# Patient Record
Sex: Female | Born: 1975 | Race: White | Hispanic: No | Smoking: Former smoker
Health system: Southern US, Community
[De-identification: ages and names within clinical notes are randomized; demographics above are authoritative.]

## PROBLEM LIST (undated history)

## (undated) DIAGNOSIS — M51369 Other intervertebral disc degeneration, lumbar region without mention of lumbar back pain or lower extremity pain: Secondary | ICD-10-CM

## (undated) DIAGNOSIS — G8929 Other chronic pain: Secondary | ICD-10-CM

## (undated) DIAGNOSIS — M1120 Other chondrocalcinosis, unspecified site: Secondary | ICD-10-CM

## (undated) DIAGNOSIS — T4145XA Adverse effect of unspecified anesthetic, initial encounter: Secondary | ICD-10-CM

## (undated) DIAGNOSIS — I10 Essential (primary) hypertension: Secondary | ICD-10-CM

## (undated) DIAGNOSIS — M5030 Other cervical disc degeneration, unspecified cervical region: Secondary | ICD-10-CM

## (undated) DIAGNOSIS — F410 Panic disorder [episodic paroxysmal anxiety] without agoraphobia: Secondary | ICD-10-CM

## (undated) DIAGNOSIS — F419 Anxiety disorder, unspecified: Secondary | ICD-10-CM

## (undated) DIAGNOSIS — N343 Urethral syndrome, unspecified: Secondary | ICD-10-CM

## (undated) DIAGNOSIS — M5136 Other intervertebral disc degeneration, lumbar region: Secondary | ICD-10-CM

## (undated) DIAGNOSIS — G4730 Sleep apnea, unspecified: Secondary | ICD-10-CM

## (undated) DIAGNOSIS — T8859XA Other complications of anesthesia, initial encounter: Secondary | ICD-10-CM

## (undated) DIAGNOSIS — J45990 Exercise induced bronchospasm: Secondary | ICD-10-CM

## (undated) DIAGNOSIS — S83419A Sprain of medial collateral ligament of unspecified knee, initial encounter: Secondary | ICD-10-CM

## (undated) DIAGNOSIS — Z8489 Family history of other specified conditions: Secondary | ICD-10-CM

## (undated) HISTORY — DX: Essential (primary) hypertension: I10

## (undated) HISTORY — DX: Other complications of anesthesia, initial encounter: T88.59XA

## (undated) HISTORY — DX: Other intervertebral disc degeneration, lumbar region: M51.36

## (undated) HISTORY — PX: APPENDECTOMY: SHX54

## (undated) HISTORY — DX: Adverse effect of unspecified anesthetic, initial encounter: T41.45XA

## (undated) HISTORY — DX: Panic disorder (episodic paroxysmal anxiety): F41.0

## (undated) HISTORY — DX: Other chondrocalcinosis, unspecified site: M11.20

## (undated) HISTORY — DX: Urethral syndrome, unspecified: N34.3

## (undated) HISTORY — DX: Sprain of medial collateral ligament of unspecified knee, initial encounter: S83.419A

## (undated) HISTORY — DX: Other cervical disc degeneration, unspecified cervical region: M50.30

## (undated) HISTORY — DX: Other chronic pain: G89.29

## (undated) HISTORY — PX: CRYOABLATION: SHX1415

## (undated) HISTORY — DX: Exercise induced bronchospasm: J45.990

## (undated) HISTORY — DX: Other intervertebral disc degeneration, lumbar region without mention of lumbar back pain or lower extremity pain: M51.369

---

## 1997-11-17 ENCOUNTER — Encounter: Admission: RE | Admit: 1997-11-17 | Discharge: 1997-11-17 | Payer: Self-pay | Admitting: Family Medicine

## 1997-12-13 ENCOUNTER — Encounter: Admission: RE | Admit: 1997-12-13 | Discharge: 1997-12-13 | Payer: Self-pay | Admitting: Family Medicine

## 1997-12-22 ENCOUNTER — Encounter: Admission: RE | Admit: 1997-12-22 | Discharge: 1997-12-22 | Payer: Self-pay | Admitting: Family Medicine

## 1997-12-27 ENCOUNTER — Encounter: Admission: RE | Admit: 1997-12-27 | Discharge: 1997-12-27 | Payer: Self-pay | Admitting: Sports Medicine

## 1998-01-12 ENCOUNTER — Encounter: Admission: RE | Admit: 1998-01-12 | Discharge: 1998-01-12 | Payer: Self-pay | Admitting: Family Medicine

## 1998-01-18 ENCOUNTER — Encounter: Admission: RE | Admit: 1998-01-18 | Discharge: 1998-01-18 | Payer: Self-pay | Admitting: Family Medicine

## 1998-01-23 ENCOUNTER — Encounter: Admission: RE | Admit: 1998-01-23 | Discharge: 1998-01-23 | Payer: Self-pay | Admitting: Family Medicine

## 1998-03-14 ENCOUNTER — Encounter: Admission: RE | Admit: 1998-03-14 | Discharge: 1998-03-14 | Payer: Self-pay | Admitting: Sports Medicine

## 1998-04-13 ENCOUNTER — Encounter: Admission: RE | Admit: 1998-04-13 | Discharge: 1998-04-13 | Payer: Self-pay | Admitting: Family Medicine

## 1998-06-20 ENCOUNTER — Encounter: Admission: RE | Admit: 1998-06-20 | Discharge: 1998-06-20 | Payer: Self-pay | Admitting: Family Medicine

## 1998-07-10 ENCOUNTER — Encounter: Admission: RE | Admit: 1998-07-10 | Discharge: 1998-07-10 | Payer: Self-pay | Admitting: Family Medicine

## 1998-07-20 ENCOUNTER — Encounter: Admission: RE | Admit: 1998-07-20 | Discharge: 1998-07-20 | Payer: Self-pay | Admitting: Family Medicine

## 1998-07-31 ENCOUNTER — Encounter: Admission: RE | Admit: 1998-07-31 | Discharge: 1998-07-31 | Payer: Self-pay | Admitting: Family Medicine

## 1998-07-31 ENCOUNTER — Emergency Department (HOSPITAL_COMMUNITY): Admission: EM | Admit: 1998-07-31 | Discharge: 1998-07-31 | Payer: Self-pay | Admitting: Emergency Medicine

## 1998-08-04 ENCOUNTER — Ambulatory Visit (HOSPITAL_COMMUNITY): Admission: RE | Admit: 1998-08-04 | Discharge: 1998-08-04 | Payer: Self-pay | Admitting: Internal Medicine

## 1998-08-06 ENCOUNTER — Encounter: Payer: Self-pay | Admitting: Internal Medicine

## 1998-08-06 ENCOUNTER — Ambulatory Visit (HOSPITAL_COMMUNITY): Admission: RE | Admit: 1998-08-06 | Discharge: 1998-08-06 | Payer: Self-pay | Admitting: Internal Medicine

## 1998-08-19 HISTORY — PX: WISDOM TOOTH EXTRACTION: SHX21

## 1998-09-14 ENCOUNTER — Encounter: Admission: RE | Admit: 1998-09-14 | Discharge: 1998-09-14 | Payer: Self-pay | Admitting: Family Medicine

## 1998-09-21 ENCOUNTER — Encounter: Admission: RE | Admit: 1998-09-21 | Discharge: 1998-09-21 | Payer: Self-pay | Admitting: Family Medicine

## 1998-10-05 ENCOUNTER — Encounter: Admission: RE | Admit: 1998-10-05 | Discharge: 1998-10-05 | Payer: Self-pay | Admitting: Family Medicine

## 1998-10-11 ENCOUNTER — Ambulatory Visit (HOSPITAL_COMMUNITY): Admission: RE | Admit: 1998-10-11 | Discharge: 1998-10-11 | Payer: Self-pay | Admitting: Family Medicine

## 1998-10-19 ENCOUNTER — Encounter: Admission: RE | Admit: 1998-10-19 | Discharge: 1998-10-19 | Payer: Self-pay | Admitting: Family Medicine

## 1998-10-27 ENCOUNTER — Encounter: Payer: Self-pay | Admitting: Oral & Maxillofacial Surgery

## 1998-10-31 ENCOUNTER — Ambulatory Visit (HOSPITAL_COMMUNITY): Admission: RE | Admit: 1998-10-31 | Discharge: 1998-10-31 | Payer: Self-pay | Admitting: Oral & Maxillofacial Surgery

## 1998-12-02 ENCOUNTER — Emergency Department (HOSPITAL_COMMUNITY): Admission: EM | Admit: 1998-12-02 | Discharge: 1998-12-02 | Payer: Self-pay | Admitting: Emergency Medicine

## 1998-12-22 ENCOUNTER — Emergency Department (HOSPITAL_COMMUNITY): Admission: EM | Admit: 1998-12-22 | Discharge: 1998-12-22 | Payer: Self-pay | Admitting: Emergency Medicine

## 1998-12-22 ENCOUNTER — Encounter: Admission: RE | Admit: 1998-12-22 | Discharge: 1998-12-22 | Payer: Self-pay | Admitting: Family Medicine

## 1999-01-24 ENCOUNTER — Encounter: Admission: RE | Admit: 1999-01-24 | Discharge: 1999-01-24 | Payer: Self-pay | Admitting: Family Medicine

## 1999-01-25 ENCOUNTER — Encounter: Admission: RE | Admit: 1999-01-25 | Discharge: 1999-01-25 | Payer: Self-pay | Admitting: Family Medicine

## 1999-02-08 ENCOUNTER — Encounter: Payer: Self-pay | Admitting: Emergency Medicine

## 1999-02-08 ENCOUNTER — Emergency Department (HOSPITAL_COMMUNITY): Admission: EM | Admit: 1999-02-08 | Discharge: 1999-02-08 | Payer: Self-pay | Admitting: Emergency Medicine

## 1999-02-13 ENCOUNTER — Encounter: Admission: RE | Admit: 1999-02-13 | Discharge: 1999-02-13 | Payer: Self-pay | Admitting: Sports Medicine

## 1999-02-16 ENCOUNTER — Encounter: Admission: RE | Admit: 1999-02-16 | Discharge: 1999-02-16 | Payer: Self-pay | Admitting: Family Medicine

## 1999-03-12 ENCOUNTER — Emergency Department (HOSPITAL_COMMUNITY): Admission: EM | Admit: 1999-03-12 | Discharge: 1999-03-12 | Payer: Self-pay | Admitting: Emergency Medicine

## 1999-04-12 ENCOUNTER — Encounter: Admission: RE | Admit: 1999-04-12 | Discharge: 1999-04-12 | Payer: Self-pay | Admitting: Family Medicine

## 1999-04-23 ENCOUNTER — Encounter: Payer: Self-pay | Admitting: Emergency Medicine

## 1999-04-23 ENCOUNTER — Emergency Department (HOSPITAL_COMMUNITY): Admission: EM | Admit: 1999-04-23 | Discharge: 1999-04-23 | Payer: Self-pay | Admitting: Emergency Medicine

## 1999-06-07 ENCOUNTER — Encounter: Admission: RE | Admit: 1999-06-07 | Discharge: 1999-06-07 | Payer: Self-pay | Admitting: Family Medicine

## 1999-06-15 ENCOUNTER — Encounter: Admission: RE | Admit: 1999-06-15 | Discharge: 1999-06-15 | Payer: Self-pay | Admitting: Sports Medicine

## 1999-07-26 ENCOUNTER — Encounter: Admission: RE | Admit: 1999-07-26 | Discharge: 1999-07-26 | Payer: Self-pay | Admitting: Family Medicine

## 1999-09-20 ENCOUNTER — Encounter: Admission: RE | Admit: 1999-09-20 | Discharge: 1999-09-20 | Payer: Self-pay | Admitting: Family Medicine

## 1999-10-13 ENCOUNTER — Emergency Department (HOSPITAL_COMMUNITY): Admission: EM | Admit: 1999-10-13 | Discharge: 1999-10-13 | Payer: Self-pay | Admitting: Emergency Medicine

## 1999-10-13 ENCOUNTER — Encounter: Payer: Self-pay | Admitting: Emergency Medicine

## 1999-10-28 ENCOUNTER — Emergency Department (HOSPITAL_COMMUNITY): Admission: EM | Admit: 1999-10-28 | Discharge: 1999-10-28 | Payer: Self-pay | Admitting: Emergency Medicine

## 1999-10-29 ENCOUNTER — Encounter: Payer: Self-pay | Admitting: Emergency Medicine

## 1999-11-19 ENCOUNTER — Encounter: Admission: RE | Admit: 1999-11-19 | Discharge: 1999-11-19 | Payer: Self-pay | Admitting: Sports Medicine

## 1999-11-19 ENCOUNTER — Encounter: Admission: RE | Admit: 1999-11-19 | Discharge: 1999-11-19 | Payer: Self-pay | Admitting: Family Medicine

## 1999-12-19 ENCOUNTER — Encounter: Admission: RE | Admit: 1999-12-19 | Discharge: 1999-12-19 | Payer: Self-pay | Admitting: Family Medicine

## 1999-12-22 ENCOUNTER — Emergency Department (HOSPITAL_COMMUNITY): Admission: EM | Admit: 1999-12-22 | Discharge: 1999-12-22 | Payer: Self-pay | Admitting: Emergency Medicine

## 2000-01-16 ENCOUNTER — Encounter: Admission: RE | Admit: 2000-01-16 | Discharge: 2000-01-16 | Payer: Self-pay | Admitting: Family Medicine

## 2000-02-06 ENCOUNTER — Encounter: Admission: RE | Admit: 2000-02-06 | Discharge: 2000-02-06 | Payer: Self-pay | Admitting: Family Medicine

## 2000-04-01 ENCOUNTER — Encounter: Admission: RE | Admit: 2000-04-01 | Discharge: 2000-04-01 | Payer: Self-pay | Admitting: Family Medicine

## 2000-06-22 ENCOUNTER — Encounter: Payer: Self-pay | Admitting: Emergency Medicine

## 2000-06-22 ENCOUNTER — Emergency Department (HOSPITAL_COMMUNITY): Admission: EM | Admit: 2000-06-22 | Discharge: 2000-06-22 | Payer: Self-pay | Admitting: Emergency Medicine

## 2000-06-27 ENCOUNTER — Encounter: Admission: RE | Admit: 2000-06-27 | Discharge: 2000-06-27 | Payer: Self-pay | Admitting: Family Medicine

## 2000-07-17 ENCOUNTER — Emergency Department (HOSPITAL_COMMUNITY): Admission: EM | Admit: 2000-07-17 | Discharge: 2000-07-18 | Payer: Self-pay | Admitting: Emergency Medicine

## 2000-07-18 ENCOUNTER — Encounter: Payer: Self-pay | Admitting: Emergency Medicine

## 2000-07-18 ENCOUNTER — Emergency Department (HOSPITAL_COMMUNITY): Admission: EM | Admit: 2000-07-18 | Discharge: 2000-07-18 | Payer: Self-pay | Admitting: Emergency Medicine

## 2000-08-26 ENCOUNTER — Encounter: Admission: RE | Admit: 2000-08-26 | Discharge: 2000-08-26 | Payer: Self-pay | Admitting: Family Medicine

## 2000-09-01 ENCOUNTER — Encounter: Admission: RE | Admit: 2000-09-01 | Discharge: 2000-09-01 | Payer: Self-pay | Admitting: Family Medicine

## 2000-09-02 ENCOUNTER — Ambulatory Visit (HOSPITAL_COMMUNITY): Admission: RE | Admit: 2000-09-02 | Discharge: 2000-09-02 | Payer: Self-pay | Admitting: *Deleted

## 2000-09-03 ENCOUNTER — Encounter: Admission: RE | Admit: 2000-09-03 | Discharge: 2000-09-03 | Payer: Self-pay | Admitting: Family Medicine

## 2000-09-05 ENCOUNTER — Encounter: Admission: RE | Admit: 2000-09-05 | Discharge: 2000-09-05 | Payer: Self-pay | Admitting: Sports Medicine

## 2000-09-05 ENCOUNTER — Encounter: Payer: Self-pay | Admitting: Sports Medicine

## 2000-09-05 ENCOUNTER — Encounter: Admission: RE | Admit: 2000-09-05 | Discharge: 2000-09-05 | Payer: Self-pay | Admitting: Family Medicine

## 2000-09-08 ENCOUNTER — Encounter: Admission: RE | Admit: 2000-09-08 | Discharge: 2000-09-08 | Payer: Self-pay | Admitting: Family Medicine

## 2000-09-23 ENCOUNTER — Encounter: Admission: RE | Admit: 2000-09-23 | Discharge: 2000-09-23 | Payer: Self-pay | Admitting: Sports Medicine

## 2001-01-06 ENCOUNTER — Encounter: Admission: RE | Admit: 2001-01-06 | Discharge: 2001-01-06 | Payer: Self-pay | Admitting: Family Medicine

## 2001-01-10 ENCOUNTER — Emergency Department (HOSPITAL_COMMUNITY): Admission: EM | Admit: 2001-01-10 | Discharge: 2001-01-11 | Payer: Self-pay

## 2001-04-05 ENCOUNTER — Emergency Department (HOSPITAL_COMMUNITY): Admission: EM | Admit: 2001-04-05 | Discharge: 2001-04-05 | Payer: Self-pay | Admitting: Emergency Medicine

## 2001-04-05 ENCOUNTER — Encounter: Payer: Self-pay | Admitting: Emergency Medicine

## 2001-04-07 ENCOUNTER — Encounter: Admission: RE | Admit: 2001-04-07 | Discharge: 2001-04-07 | Payer: Self-pay | Admitting: Family Medicine

## 2001-06-11 ENCOUNTER — Encounter: Payer: Self-pay | Admitting: Emergency Medicine

## 2001-06-11 ENCOUNTER — Emergency Department (HOSPITAL_COMMUNITY): Admission: EM | Admit: 2001-06-11 | Discharge: 2001-06-11 | Payer: Self-pay | Admitting: Emergency Medicine

## 2001-06-26 ENCOUNTER — Emergency Department (HOSPITAL_COMMUNITY): Admission: EM | Admit: 2001-06-26 | Discharge: 2001-06-26 | Payer: Self-pay | Admitting: Emergency Medicine

## 2001-07-12 ENCOUNTER — Emergency Department (HOSPITAL_COMMUNITY): Admission: EM | Admit: 2001-07-12 | Discharge: 2001-07-12 | Payer: Self-pay | Admitting: Emergency Medicine

## 2001-08-31 ENCOUNTER — Emergency Department (HOSPITAL_COMMUNITY): Admission: EM | Admit: 2001-08-31 | Discharge: 2001-08-31 | Payer: Self-pay | Admitting: Emergency Medicine

## 2001-08-31 ENCOUNTER — Encounter: Payer: Self-pay | Admitting: Emergency Medicine

## 2001-09-28 ENCOUNTER — Emergency Department (HOSPITAL_COMMUNITY): Admission: EM | Admit: 2001-09-28 | Discharge: 2001-09-28 | Payer: Self-pay | Admitting: Emergency Medicine

## 2001-09-29 ENCOUNTER — Encounter: Payer: Self-pay | Admitting: Emergency Medicine

## 2001-09-29 ENCOUNTER — Ambulatory Visit (HOSPITAL_COMMUNITY): Admission: RE | Admit: 2001-09-29 | Discharge: 2001-09-29 | Payer: Self-pay | Admitting: Emergency Medicine

## 2002-01-15 ENCOUNTER — Encounter: Admission: RE | Admit: 2002-01-15 | Discharge: 2002-01-15 | Payer: Self-pay | Admitting: Family Medicine

## 2002-01-19 ENCOUNTER — Encounter: Admission: RE | Admit: 2002-01-19 | Discharge: 2002-01-19 | Payer: Self-pay | Admitting: Family Medicine

## 2002-01-22 ENCOUNTER — Inpatient Hospital Stay (HOSPITAL_COMMUNITY): Admission: AD | Admit: 2002-01-22 | Discharge: 2002-01-22 | Payer: Self-pay | Admitting: *Deleted

## 2002-01-22 ENCOUNTER — Encounter: Admission: RE | Admit: 2002-01-22 | Discharge: 2002-01-22 | Payer: Self-pay | Admitting: Family Medicine

## 2002-01-29 ENCOUNTER — Encounter: Admission: RE | Admit: 2002-01-29 | Discharge: 2002-01-29 | Payer: Self-pay | Admitting: Family Medicine

## 2002-02-28 ENCOUNTER — Inpatient Hospital Stay (HOSPITAL_COMMUNITY): Admission: AD | Admit: 2002-02-28 | Discharge: 2002-02-28 | Payer: Self-pay | Admitting: *Deleted

## 2002-03-01 ENCOUNTER — Encounter: Admission: RE | Admit: 2002-03-01 | Discharge: 2002-03-01 | Payer: Self-pay | Admitting: Family Medicine

## 2002-03-03 ENCOUNTER — Encounter: Payer: Self-pay | Admitting: Emergency Medicine

## 2002-03-03 ENCOUNTER — Emergency Department (HOSPITAL_COMMUNITY): Admission: EM | Admit: 2002-03-03 | Discharge: 2002-03-03 | Payer: Self-pay | Admitting: Emergency Medicine

## 2002-04-01 ENCOUNTER — Encounter: Admission: RE | Admit: 2002-04-01 | Discharge: 2002-04-01 | Payer: Self-pay | Admitting: Family Medicine

## 2002-04-12 ENCOUNTER — Inpatient Hospital Stay (HOSPITAL_COMMUNITY): Admission: AD | Admit: 2002-04-12 | Discharge: 2002-04-12 | Payer: Self-pay | Admitting: *Deleted

## 2002-04-16 ENCOUNTER — Ambulatory Visit (HOSPITAL_COMMUNITY): Admission: RE | Admit: 2002-04-16 | Discharge: 2002-04-16 | Payer: Self-pay | Admitting: Family Medicine

## 2002-04-28 ENCOUNTER — Encounter: Admission: RE | Admit: 2002-04-28 | Discharge: 2002-04-28 | Payer: Self-pay | Admitting: Family Medicine

## 2002-05-07 ENCOUNTER — Encounter: Admission: RE | Admit: 2002-05-07 | Discharge: 2002-05-07 | Payer: Self-pay | Admitting: Family Medicine

## 2002-05-12 ENCOUNTER — Inpatient Hospital Stay (HOSPITAL_COMMUNITY): Admission: AD | Admit: 2002-05-12 | Discharge: 2002-05-12 | Payer: Self-pay | Admitting: Obstetrics and Gynecology

## 2002-06-07 ENCOUNTER — Encounter: Admission: RE | Admit: 2002-06-07 | Discharge: 2002-06-07 | Payer: Self-pay | Admitting: Family Medicine

## 2002-06-09 ENCOUNTER — Encounter: Admission: RE | Admit: 2002-06-09 | Discharge: 2002-06-09 | Payer: Self-pay | Admitting: Family Medicine

## 2002-06-18 ENCOUNTER — Encounter: Admission: RE | Admit: 2002-06-18 | Discharge: 2002-06-18 | Payer: Self-pay | Admitting: Family Medicine

## 2002-07-07 ENCOUNTER — Encounter: Admission: RE | Admit: 2002-07-07 | Discharge: 2002-07-07 | Payer: Self-pay | Admitting: Family Medicine

## 2002-07-19 ENCOUNTER — Encounter: Admission: RE | Admit: 2002-07-19 | Discharge: 2002-07-19 | Payer: Self-pay | Admitting: Family Medicine

## 2002-07-24 ENCOUNTER — Inpatient Hospital Stay (HOSPITAL_COMMUNITY): Admission: AD | Admit: 2002-07-24 | Discharge: 2002-07-24 | Payer: Self-pay | Admitting: Obstetrics and Gynecology

## 2002-08-06 ENCOUNTER — Encounter: Admission: RE | Admit: 2002-08-06 | Discharge: 2002-08-06 | Payer: Self-pay | Admitting: Family Medicine

## 2002-08-18 ENCOUNTER — Encounter: Payer: Self-pay | Admitting: Obstetrics and Gynecology

## 2002-08-18 ENCOUNTER — Inpatient Hospital Stay (HOSPITAL_COMMUNITY): Admission: AD | Admit: 2002-08-18 | Discharge: 2002-08-18 | Payer: Self-pay | Admitting: Obstetrics and Gynecology

## 2002-08-18 ENCOUNTER — Encounter: Admission: RE | Admit: 2002-08-18 | Discharge: 2002-08-18 | Payer: Self-pay | Admitting: Family Medicine

## 2002-08-23 ENCOUNTER — Encounter (HOSPITAL_COMMUNITY): Admission: RE | Admit: 2002-08-23 | Discharge: 2002-09-02 | Payer: Self-pay | Admitting: *Deleted

## 2002-08-26 ENCOUNTER — Encounter: Admission: RE | Admit: 2002-08-26 | Discharge: 2002-08-26 | Payer: Self-pay | Admitting: Sports Medicine

## 2002-08-28 ENCOUNTER — Inpatient Hospital Stay (HOSPITAL_COMMUNITY): Admission: AD | Admit: 2002-08-28 | Discharge: 2002-08-28 | Payer: Self-pay | Admitting: Obstetrics and Gynecology

## 2002-08-30 ENCOUNTER — Encounter: Admission: RE | Admit: 2002-08-30 | Discharge: 2002-08-30 | Payer: Self-pay | Admitting: Family Medicine

## 2002-09-02 ENCOUNTER — Inpatient Hospital Stay (HOSPITAL_COMMUNITY): Admission: AD | Admit: 2002-09-02 | Discharge: 2002-09-02 | Payer: Self-pay | Admitting: Obstetrics and Gynecology

## 2002-09-06 ENCOUNTER — Encounter (HOSPITAL_COMMUNITY): Admission: RE | Admit: 2002-09-06 | Discharge: 2002-09-21 | Payer: Self-pay | Admitting: Obstetrics and Gynecology

## 2002-09-07 ENCOUNTER — Encounter: Admission: RE | Admit: 2002-09-07 | Discharge: 2002-09-07 | Payer: Self-pay | Admitting: Sports Medicine

## 2002-09-09 ENCOUNTER — Inpatient Hospital Stay (HOSPITAL_COMMUNITY): Admission: AD | Admit: 2002-09-09 | Discharge: 2002-09-09 | Payer: Self-pay | Admitting: *Deleted

## 2002-09-09 ENCOUNTER — Encounter: Payer: Self-pay | Admitting: *Deleted

## 2002-09-14 ENCOUNTER — Inpatient Hospital Stay (HOSPITAL_COMMUNITY): Admission: AD | Admit: 2002-09-14 | Discharge: 2002-09-14 | Payer: Self-pay | Admitting: *Deleted

## 2002-09-17 ENCOUNTER — Encounter: Admission: RE | Admit: 2002-09-17 | Discharge: 2002-09-17 | Payer: Self-pay | Admitting: Family Medicine

## 2002-09-21 ENCOUNTER — Inpatient Hospital Stay (HOSPITAL_COMMUNITY): Admission: AD | Admit: 2002-09-21 | Discharge: 2002-09-25 | Payer: Self-pay | Admitting: Obstetrics and Gynecology

## 2002-09-22 ENCOUNTER — Encounter (INDEPENDENT_AMBULATORY_CARE_PROVIDER_SITE_OTHER): Payer: Self-pay

## 2002-09-24 ENCOUNTER — Encounter: Admission: RE | Admit: 2002-09-24 | Discharge: 2002-09-24 | Payer: Self-pay | Admitting: Family Medicine

## 2002-09-26 ENCOUNTER — Encounter: Admission: RE | Admit: 2002-09-26 | Discharge: 2002-10-26 | Payer: Self-pay | Admitting: *Deleted

## 2002-10-02 ENCOUNTER — Emergency Department (HOSPITAL_COMMUNITY): Admission: EM | Admit: 2002-10-02 | Discharge: 2002-10-02 | Payer: Self-pay

## 2002-10-05 ENCOUNTER — Encounter: Admission: RE | Admit: 2002-10-05 | Discharge: 2002-10-05 | Payer: Self-pay | Admitting: Family Medicine

## 2002-10-20 ENCOUNTER — Encounter: Admission: RE | Admit: 2002-10-20 | Discharge: 2002-10-20 | Payer: Self-pay | Admitting: Family Medicine

## 2002-10-26 ENCOUNTER — Encounter: Admission: RE | Admit: 2002-10-26 | Discharge: 2003-01-24 | Payer: Self-pay | Admitting: Sports Medicine

## 2002-11-25 ENCOUNTER — Encounter: Admission: RE | Admit: 2002-11-25 | Discharge: 2002-12-25 | Payer: Self-pay | Admitting: *Deleted

## 2002-11-29 ENCOUNTER — Encounter: Admission: RE | Admit: 2002-11-29 | Discharge: 2002-11-29 | Payer: Self-pay | Admitting: Family Medicine

## 2002-12-10 ENCOUNTER — Encounter: Admission: RE | Admit: 2002-12-10 | Discharge: 2002-12-10 | Payer: Self-pay | Admitting: Family Medicine

## 2002-12-24 ENCOUNTER — Encounter: Admission: RE | Admit: 2002-12-24 | Discharge: 2002-12-24 | Payer: Self-pay | Admitting: Family Medicine

## 2003-01-06 ENCOUNTER — Emergency Department (HOSPITAL_COMMUNITY): Admission: EM | Admit: 2003-01-06 | Discharge: 2003-01-06 | Payer: Self-pay | Admitting: Emergency Medicine

## 2003-01-07 ENCOUNTER — Encounter: Admission: RE | Admit: 2003-01-07 | Discharge: 2003-01-07 | Payer: Self-pay | Admitting: Family Medicine

## 2003-01-24 ENCOUNTER — Encounter: Admission: RE | Admit: 2003-01-24 | Discharge: 2003-01-24 | Payer: Self-pay | Admitting: Sports Medicine

## 2003-01-25 ENCOUNTER — Encounter: Admission: RE | Admit: 2003-01-25 | Discharge: 2003-02-24 | Payer: Self-pay | Admitting: *Deleted

## 2003-01-27 ENCOUNTER — Encounter: Admission: RE | Admit: 2003-01-27 | Discharge: 2003-01-27 | Payer: Self-pay | Admitting: Family Medicine

## 2003-03-27 ENCOUNTER — Encounter: Admission: RE | Admit: 2003-03-27 | Discharge: 2003-04-26 | Payer: Self-pay | Admitting: *Deleted

## 2003-04-27 ENCOUNTER — Encounter: Admission: RE | Admit: 2003-04-27 | Discharge: 2003-05-27 | Payer: Self-pay | Admitting: *Deleted

## 2003-06-27 ENCOUNTER — Encounter: Admission: RE | Admit: 2003-06-27 | Discharge: 2003-07-27 | Payer: Self-pay | Admitting: Obstetrics and Gynecology

## 2004-11-17 ENCOUNTER — Encounter (INDEPENDENT_AMBULATORY_CARE_PROVIDER_SITE_OTHER): Payer: Self-pay | Admitting: *Deleted

## 2004-11-17 LAB — CONVERTED CEMR LAB

## 2004-11-22 ENCOUNTER — Inpatient Hospital Stay (HOSPITAL_COMMUNITY): Admission: AD | Admit: 2004-11-22 | Discharge: 2004-11-22 | Payer: Self-pay | Admitting: Obstetrics & Gynecology

## 2004-11-29 ENCOUNTER — Inpatient Hospital Stay (HOSPITAL_COMMUNITY): Admission: RE | Admit: 2004-11-29 | Discharge: 2004-11-29 | Payer: Self-pay | Admitting: Obstetrics & Gynecology

## 2004-11-30 ENCOUNTER — Ambulatory Visit: Payer: Self-pay | Admitting: *Deleted

## 2004-11-30 ENCOUNTER — Inpatient Hospital Stay (HOSPITAL_COMMUNITY): Admission: AD | Admit: 2004-11-30 | Discharge: 2004-12-02 | Payer: Self-pay | Admitting: *Deleted

## 2004-12-11 ENCOUNTER — Ambulatory Visit: Payer: Self-pay | Admitting: Sports Medicine

## 2004-12-11 ENCOUNTER — Other Ambulatory Visit: Admission: RE | Admit: 2004-12-11 | Discharge: 2004-12-11 | Payer: Self-pay | Admitting: Family Medicine

## 2005-02-11 ENCOUNTER — Ambulatory Visit: Payer: Self-pay | Admitting: Sports Medicine

## 2005-02-13 ENCOUNTER — Ambulatory Visit (HOSPITAL_COMMUNITY): Admission: RE | Admit: 2005-02-13 | Discharge: 2005-02-13 | Payer: Self-pay | Admitting: Family Medicine

## 2005-03-12 ENCOUNTER — Ambulatory Visit: Payer: Self-pay | Admitting: Sports Medicine

## 2005-04-12 ENCOUNTER — Ambulatory Visit: Payer: Self-pay | Admitting: Family Medicine

## 2005-05-08 ENCOUNTER — Ambulatory Visit: Payer: Self-pay | Admitting: Family Medicine

## 2005-05-10 ENCOUNTER — Ambulatory Visit: Payer: Self-pay | Admitting: Family Medicine

## 2005-05-24 ENCOUNTER — Ambulatory Visit: Payer: Self-pay | Admitting: Family Medicine

## 2005-05-24 ENCOUNTER — Inpatient Hospital Stay (HOSPITAL_COMMUNITY): Admission: AD | Admit: 2005-05-24 | Discharge: 2005-05-25 | Payer: Self-pay | Admitting: *Deleted

## 2005-05-28 ENCOUNTER — Ambulatory Visit: Payer: Self-pay | Admitting: Family Medicine

## 2005-06-19 ENCOUNTER — Ambulatory Visit: Payer: Self-pay | Admitting: Family Medicine

## 2005-06-24 ENCOUNTER — Inpatient Hospital Stay (HOSPITAL_COMMUNITY): Admission: AD | Admit: 2005-06-24 | Discharge: 2005-06-27 | Payer: Self-pay | Admitting: *Deleted

## 2005-06-24 ENCOUNTER — Encounter (INDEPENDENT_AMBULATORY_CARE_PROVIDER_SITE_OTHER): Payer: Self-pay | Admitting: Specialist

## 2005-06-24 ENCOUNTER — Ambulatory Visit: Payer: Self-pay | Admitting: *Deleted

## 2005-06-28 HISTORY — PX: TUBAL LIGATION: SHX77

## 2005-06-29 ENCOUNTER — Inpatient Hospital Stay (HOSPITAL_COMMUNITY): Admission: AD | Admit: 2005-06-29 | Discharge: 2005-06-29 | Payer: Self-pay | Admitting: Family Medicine

## 2005-06-29 ENCOUNTER — Ambulatory Visit: Payer: Self-pay | Admitting: *Deleted

## 2005-06-30 ENCOUNTER — Inpatient Hospital Stay (HOSPITAL_COMMUNITY): Admission: AD | Admit: 2005-06-30 | Discharge: 2005-06-30 | Payer: Self-pay | Admitting: Obstetrics and Gynecology

## 2005-07-16 ENCOUNTER — Ambulatory Visit: Payer: Self-pay | Admitting: Sports Medicine

## 2005-07-24 ENCOUNTER — Ambulatory Visit: Payer: Self-pay | Admitting: Family Medicine

## 2005-07-28 ENCOUNTER — Emergency Department (HOSPITAL_COMMUNITY): Admission: EM | Admit: 2005-07-28 | Discharge: 2005-07-28 | Payer: Self-pay | Admitting: Emergency Medicine

## 2005-08-13 ENCOUNTER — Emergency Department (HOSPITAL_COMMUNITY): Admission: EM | Admit: 2005-08-13 | Discharge: 2005-08-13 | Payer: Self-pay | Admitting: Emergency Medicine

## 2005-08-21 ENCOUNTER — Ambulatory Visit: Payer: Self-pay | Admitting: Family Medicine

## 2005-09-24 ENCOUNTER — Ambulatory Visit (HOSPITAL_COMMUNITY): Admission: RE | Admit: 2005-09-24 | Discharge: 2005-09-24 | Payer: Self-pay | Admitting: Family Medicine

## 2005-09-24 ENCOUNTER — Emergency Department (HOSPITAL_COMMUNITY): Admission: EM | Admit: 2005-09-24 | Discharge: 2005-09-24 | Payer: Self-pay | Admitting: Family Medicine

## 2005-10-04 ENCOUNTER — Ambulatory Visit: Payer: Self-pay | Admitting: Sports Medicine

## 2005-10-14 ENCOUNTER — Ambulatory Visit: Payer: Self-pay | Admitting: Family Medicine

## 2005-11-28 ENCOUNTER — Ambulatory Visit: Payer: Self-pay | Admitting: Family Medicine

## 2006-02-07 ENCOUNTER — Ambulatory Visit: Payer: Self-pay | Admitting: Family Medicine

## 2006-04-14 ENCOUNTER — Emergency Department (HOSPITAL_COMMUNITY): Admission: EM | Admit: 2006-04-14 | Discharge: 2006-04-14 | Payer: Self-pay | Admitting: Emergency Medicine

## 2006-04-26 ENCOUNTER — Emergency Department (HOSPITAL_COMMUNITY): Admission: EM | Admit: 2006-04-26 | Discharge: 2006-04-26 | Payer: Self-pay | Admitting: Emergency Medicine

## 2006-06-06 ENCOUNTER — Emergency Department (HOSPITAL_COMMUNITY): Admission: EM | Admit: 2006-06-06 | Discharge: 2006-06-06 | Payer: Self-pay | Admitting: Family Medicine

## 2006-10-16 DIAGNOSIS — J309 Allergic rhinitis, unspecified: Secondary | ICD-10-CM

## 2006-10-17 ENCOUNTER — Encounter (INDEPENDENT_AMBULATORY_CARE_PROVIDER_SITE_OTHER): Payer: Self-pay | Admitting: *Deleted

## 2006-10-28 ENCOUNTER — Emergency Department (HOSPITAL_COMMUNITY): Admission: EM | Admit: 2006-10-28 | Discharge: 2006-10-28 | Payer: Self-pay | Admitting: Emergency Medicine

## 2006-12-13 ENCOUNTER — Emergency Department (HOSPITAL_COMMUNITY): Admission: EM | Admit: 2006-12-13 | Discharge: 2006-12-13 | Payer: Self-pay | Admitting: Emergency Medicine

## 2006-12-15 ENCOUNTER — Ambulatory Visit: Payer: Self-pay | Admitting: Family Medicine

## 2006-12-15 ENCOUNTER — Telehealth: Payer: Self-pay | Admitting: *Deleted

## 2006-12-15 ENCOUNTER — Encounter: Payer: Self-pay | Admitting: Family Medicine

## 2007-04-29 ENCOUNTER — Ambulatory Visit: Payer: Self-pay

## 2007-04-29 ENCOUNTER — Telehealth (INDEPENDENT_AMBULATORY_CARE_PROVIDER_SITE_OTHER): Payer: Self-pay | Admitting: *Deleted

## 2007-04-29 LAB — CONVERTED CEMR LAB
Bilirubin Urine: NEGATIVE
Glucose, Urine, Semiquant: NEGATIVE
Protein, U semiquant: 100
pH: 7

## 2007-05-12 ENCOUNTER — Emergency Department (HOSPITAL_COMMUNITY): Admission: EM | Admit: 2007-05-12 | Discharge: 2007-05-13 | Payer: Self-pay | Admitting: Emergency Medicine

## 2007-05-23 ENCOUNTER — Emergency Department (HOSPITAL_COMMUNITY): Admission: EM | Admit: 2007-05-23 | Discharge: 2007-05-23 | Payer: Self-pay | Admitting: Emergency Medicine

## 2007-05-25 ENCOUNTER — Ambulatory Visit: Payer: Self-pay | Admitting: Family Medicine

## 2007-05-25 ENCOUNTER — Telehealth (INDEPENDENT_AMBULATORY_CARE_PROVIDER_SITE_OTHER): Payer: Self-pay | Admitting: *Deleted

## 2007-06-04 ENCOUNTER — Telehealth: Payer: Self-pay | Admitting: Psychology

## 2007-06-08 ENCOUNTER — Ambulatory Visit: Payer: Self-pay | Admitting: Family Medicine

## 2007-06-08 ENCOUNTER — Telehealth: Payer: Self-pay | Admitting: *Deleted

## 2007-06-08 DIAGNOSIS — F410 Panic disorder [episodic paroxysmal anxiety] without agoraphobia: Secondary | ICD-10-CM | POA: Insufficient documentation

## 2007-06-09 ENCOUNTER — Ambulatory Visit: Payer: Self-pay | Admitting: Family Medicine

## 2007-06-10 ENCOUNTER — Telehealth: Payer: Self-pay | Admitting: Psychology

## 2007-06-16 ENCOUNTER — Ambulatory Visit: Payer: Self-pay | Admitting: Psychology

## 2007-06-24 ENCOUNTER — Ambulatory Visit: Payer: Self-pay | Admitting: Family Medicine

## 2007-06-25 ENCOUNTER — Ambulatory Visit: Payer: Self-pay | Admitting: Family Medicine

## 2007-07-02 ENCOUNTER — Ambulatory Visit: Payer: Self-pay | Admitting: Family Medicine

## 2007-07-02 DIAGNOSIS — F319 Bipolar disorder, unspecified: Secondary | ICD-10-CM

## 2007-07-06 ENCOUNTER — Telehealth (INDEPENDENT_AMBULATORY_CARE_PROVIDER_SITE_OTHER): Payer: Self-pay | Admitting: Family Medicine

## 2007-07-09 ENCOUNTER — Ambulatory Visit: Payer: Self-pay | Admitting: Psychology

## 2007-07-21 ENCOUNTER — Telehealth (INDEPENDENT_AMBULATORY_CARE_PROVIDER_SITE_OTHER): Payer: Self-pay | Admitting: Family Medicine

## 2007-07-24 ENCOUNTER — Ambulatory Visit: Payer: Self-pay | Admitting: Family Medicine

## 2007-07-27 ENCOUNTER — Telehealth: Payer: Self-pay | Admitting: *Deleted

## 2007-07-27 ENCOUNTER — Ambulatory Visit: Payer: Self-pay | Admitting: Family Medicine

## 2007-07-30 ENCOUNTER — Ambulatory Visit: Payer: Self-pay | Admitting: Psychology

## 2007-08-04 ENCOUNTER — Telehealth: Payer: Self-pay | Admitting: *Deleted

## 2007-08-04 ENCOUNTER — Ambulatory Visit: Payer: Self-pay | Admitting: Family Medicine

## 2007-08-05 ENCOUNTER — Ambulatory Visit: Payer: Self-pay | Admitting: Psychology

## 2007-08-06 ENCOUNTER — Ambulatory Visit: Payer: Self-pay | Admitting: Psychology

## 2007-08-16 ENCOUNTER — Emergency Department (HOSPITAL_COMMUNITY): Admission: EM | Admit: 2007-08-16 | Discharge: 2007-08-17 | Payer: Self-pay | Admitting: Emergency Medicine

## 2007-08-18 ENCOUNTER — Ambulatory Visit: Payer: Self-pay | Admitting: Sports Medicine

## 2007-08-18 ENCOUNTER — Telehealth: Payer: Self-pay | Admitting: Psychology

## 2007-08-20 HISTORY — PX: ENDOMETRIAL ABLATION: SHX621

## 2007-08-25 ENCOUNTER — Ambulatory Visit: Payer: Self-pay | Admitting: Psychology

## 2007-08-31 ENCOUNTER — Emergency Department (HOSPITAL_COMMUNITY): Admission: EM | Admit: 2007-08-31 | Discharge: 2007-08-31 | Payer: Self-pay | Admitting: Emergency Medicine

## 2007-09-03 ENCOUNTER — Encounter: Payer: Self-pay | Admitting: Psychology

## 2007-09-09 ENCOUNTER — Ambulatory Visit: Payer: Self-pay | Admitting: Psychology

## 2007-09-10 ENCOUNTER — Ambulatory Visit: Payer: Self-pay | Admitting: Psychology

## 2007-09-17 ENCOUNTER — Telehealth (INDEPENDENT_AMBULATORY_CARE_PROVIDER_SITE_OTHER): Payer: Self-pay | Admitting: Family Medicine

## 2007-09-18 ENCOUNTER — Emergency Department (HOSPITAL_COMMUNITY): Admission: EM | Admit: 2007-09-18 | Discharge: 2007-09-18 | Payer: Self-pay | Admitting: Emergency Medicine

## 2007-09-27 ENCOUNTER — Telehealth (INDEPENDENT_AMBULATORY_CARE_PROVIDER_SITE_OTHER): Payer: Self-pay | Admitting: Family Medicine

## 2007-09-28 ENCOUNTER — Emergency Department (HOSPITAL_COMMUNITY): Admission: EM | Admit: 2007-09-28 | Discharge: 2007-09-28 | Payer: Self-pay | Admitting: Emergency Medicine

## 2007-09-28 ENCOUNTER — Telehealth: Payer: Self-pay | Admitting: Psychology

## 2007-09-28 ENCOUNTER — Telehealth (INDEPENDENT_AMBULATORY_CARE_PROVIDER_SITE_OTHER): Payer: Self-pay | Admitting: Family Medicine

## 2007-10-07 ENCOUNTER — Ambulatory Visit: Payer: Self-pay | Admitting: Psychology

## 2007-10-07 ENCOUNTER — Encounter (INDEPENDENT_AMBULATORY_CARE_PROVIDER_SITE_OTHER): Payer: Self-pay | Admitting: Family Medicine

## 2007-10-07 ENCOUNTER — Emergency Department: Payer: Self-pay | Admitting: Emergency Medicine

## 2007-10-07 LAB — CONVERTED CEMR LAB
AST: 37 units/L (ref 0–37)
Alkaline Phosphatase: 62 units/L (ref 39–117)
BUN: 11 mg/dL (ref 6–23)
Creatinine, Ser: 0.72 mg/dL (ref 0.40–1.20)
HCT: 40.4 % (ref 36.0–46.0)
Hemoglobin: 13.4 g/dL (ref 12.0–15.0)
MCHC: 33.2 g/dL (ref 30.0–36.0)
MCV: 93.5 fL (ref 78.0–100.0)
RDW: 12.4 % (ref 11.5–15.5)

## 2007-10-27 ENCOUNTER — Telehealth (INDEPENDENT_AMBULATORY_CARE_PROVIDER_SITE_OTHER): Payer: Self-pay | Admitting: *Deleted

## 2007-10-27 ENCOUNTER — Ambulatory Visit: Payer: Self-pay | Admitting: Family Medicine

## 2007-10-28 ENCOUNTER — Encounter (INDEPENDENT_AMBULATORY_CARE_PROVIDER_SITE_OTHER): Payer: Self-pay | Admitting: *Deleted

## 2007-10-30 ENCOUNTER — Ambulatory Visit: Payer: Self-pay | Admitting: Family Medicine

## 2007-10-30 ENCOUNTER — Encounter (INDEPENDENT_AMBULATORY_CARE_PROVIDER_SITE_OTHER): Payer: Self-pay | Admitting: Family Medicine

## 2007-10-30 LAB — CONVERTED CEMR LAB
Chlamydia, DNA Probe: NEGATIVE
MCHC: 33.7 g/dL (ref 30.0–36.0)
Platelets: 191 10*3/uL (ref 150–400)
RBC: 4.19 M/uL (ref 3.87–5.11)
WBC: 5.9 10*3/uL (ref 4.0–10.5)
Whiff Test: POSITIVE

## 2007-11-02 ENCOUNTER — Encounter (INDEPENDENT_AMBULATORY_CARE_PROVIDER_SITE_OTHER): Payer: Self-pay | Admitting: Family Medicine

## 2007-11-02 ENCOUNTER — Ambulatory Visit: Payer: Self-pay | Admitting: Family Medicine

## 2007-11-02 ENCOUNTER — Encounter: Payer: Self-pay | Admitting: Family Medicine

## 2007-11-02 ENCOUNTER — Telehealth: Payer: Self-pay | Admitting: *Deleted

## 2007-11-02 LAB — CONVERTED CEMR LAB
Pap Smear: NORMAL
TSH: 1.696 microintl units/mL (ref 0.350–5.50)

## 2007-11-03 ENCOUNTER — Encounter: Payer: Self-pay | Admitting: Family Medicine

## 2007-11-03 ENCOUNTER — Telehealth (INDEPENDENT_AMBULATORY_CARE_PROVIDER_SITE_OTHER): Payer: Self-pay | Admitting: Family Medicine

## 2007-11-05 ENCOUNTER — Ambulatory Visit: Payer: Self-pay | Admitting: Family Medicine

## 2007-11-08 ENCOUNTER — Emergency Department (HOSPITAL_COMMUNITY): Admission: EM | Admit: 2007-11-08 | Discharge: 2007-11-08 | Payer: Self-pay | Admitting: Pediatrics

## 2007-11-09 ENCOUNTER — Emergency Department (HOSPITAL_COMMUNITY): Admission: EM | Admit: 2007-11-09 | Discharge: 2007-11-09 | Payer: Self-pay | Admitting: Emergency Medicine

## 2007-11-09 ENCOUNTER — Telehealth: Payer: Self-pay | Admitting: *Deleted

## 2007-11-09 ENCOUNTER — Encounter (INDEPENDENT_AMBULATORY_CARE_PROVIDER_SITE_OTHER): Payer: Self-pay | Admitting: Family Medicine

## 2007-11-16 ENCOUNTER — Encounter: Payer: Self-pay | Admitting: *Deleted

## 2007-11-16 ENCOUNTER — Ambulatory Visit: Payer: Self-pay | Admitting: Family Medicine

## 2007-11-17 ENCOUNTER — Telehealth: Payer: Self-pay | Admitting: *Deleted

## 2007-11-18 ENCOUNTER — Encounter: Payer: Self-pay | Admitting: Psychology

## 2007-11-24 ENCOUNTER — Ambulatory Visit: Payer: Self-pay | Admitting: Psychology

## 2007-11-30 ENCOUNTER — Ambulatory Visit: Payer: Self-pay | Admitting: Sports Medicine

## 2007-12-03 ENCOUNTER — Ambulatory Visit: Payer: Self-pay | Admitting: Family Medicine

## 2007-12-03 ENCOUNTER — Telehealth: Payer: Self-pay | Admitting: *Deleted

## 2007-12-03 LAB — CONVERTED CEMR LAB: Rapid Strep: NEGATIVE

## 2007-12-10 ENCOUNTER — Telehealth: Payer: Self-pay | Admitting: Psychology

## 2007-12-11 ENCOUNTER — Ambulatory Visit: Payer: Self-pay | Admitting: Family Medicine

## 2007-12-11 ENCOUNTER — Encounter: Payer: Self-pay | Admitting: Psychology

## 2007-12-14 ENCOUNTER — Telehealth: Payer: Self-pay | Admitting: Psychology

## 2007-12-16 ENCOUNTER — Telehealth: Payer: Self-pay | Admitting: *Deleted

## 2007-12-17 ENCOUNTER — Encounter: Payer: Self-pay | Admitting: *Deleted

## 2007-12-22 ENCOUNTER — Ambulatory Visit: Payer: Self-pay | Admitting: Psychology

## 2007-12-24 ENCOUNTER — Encounter: Payer: Self-pay | Admitting: Psychology

## 2007-12-29 ENCOUNTER — Ambulatory Visit: Payer: Self-pay | Admitting: Family Medicine

## 2007-12-30 ENCOUNTER — Ambulatory Visit: Payer: Self-pay | Admitting: Psychology

## 2008-01-07 ENCOUNTER — Ambulatory Visit: Payer: Self-pay | Admitting: Sports Medicine

## 2008-01-07 ENCOUNTER — Encounter (INDEPENDENT_AMBULATORY_CARE_PROVIDER_SITE_OTHER): Payer: Self-pay | Admitting: Family Medicine

## 2008-01-07 LAB — CONVERTED CEMR LAB
MCV: 94.2 fL (ref 78.0–100.0)
Platelets: 161 10*3/uL (ref 150–400)
RDW: 11.9 % (ref 11.5–15.5)
WBC: 7.4 10*3/uL (ref 4.0–10.5)

## 2008-01-08 ENCOUNTER — Encounter: Admission: RE | Admit: 2008-01-08 | Discharge: 2008-01-08 | Payer: Self-pay | Admitting: Family Medicine

## 2008-01-10 ENCOUNTER — Emergency Department (HOSPITAL_COMMUNITY): Admission: EM | Admit: 2008-01-10 | Discharge: 2008-01-10 | Payer: Self-pay | Admitting: Family Medicine

## 2008-01-12 ENCOUNTER — Ambulatory Visit: Payer: Self-pay | Admitting: Family Medicine

## 2008-01-12 ENCOUNTER — Encounter (INDEPENDENT_AMBULATORY_CARE_PROVIDER_SITE_OTHER): Payer: Self-pay | Admitting: Family Medicine

## 2008-01-13 ENCOUNTER — Encounter (INDEPENDENT_AMBULATORY_CARE_PROVIDER_SITE_OTHER): Payer: Self-pay | Admitting: Family Medicine

## 2008-01-15 ENCOUNTER — Telehealth: Payer: Self-pay | Admitting: *Deleted

## 2008-01-18 ENCOUNTER — Telehealth (INDEPENDENT_AMBULATORY_CARE_PROVIDER_SITE_OTHER): Payer: Self-pay | Admitting: *Deleted

## 2008-01-19 ENCOUNTER — Encounter (INDEPENDENT_AMBULATORY_CARE_PROVIDER_SITE_OTHER): Payer: Self-pay | Admitting: Family Medicine

## 2008-01-19 ENCOUNTER — Ambulatory Visit: Payer: Self-pay | Admitting: Family Medicine

## 2008-01-20 ENCOUNTER — Ambulatory Visit: Payer: Self-pay | Admitting: Psychology

## 2008-01-22 ENCOUNTER — Encounter: Payer: Self-pay | Admitting: Family Medicine

## 2008-01-23 ENCOUNTER — Emergency Department (HOSPITAL_COMMUNITY): Admission: EM | Admit: 2008-01-23 | Discharge: 2008-01-23 | Payer: Self-pay | Admitting: Emergency Medicine

## 2008-01-25 ENCOUNTER — Telehealth (INDEPENDENT_AMBULATORY_CARE_PROVIDER_SITE_OTHER): Payer: Self-pay | Admitting: Family Medicine

## 2008-01-25 ENCOUNTER — Encounter: Payer: Self-pay | Admitting: *Deleted

## 2008-01-26 ENCOUNTER — Encounter: Payer: Self-pay | Admitting: *Deleted

## 2008-01-28 ENCOUNTER — Emergency Department (HOSPITAL_COMMUNITY): Admission: EM | Admit: 2008-01-28 | Discharge: 2008-01-29 | Payer: Self-pay | Admitting: Emergency Medicine

## 2008-01-28 ENCOUNTER — Ambulatory Visit: Payer: Self-pay | Admitting: Psychology

## 2008-01-29 ENCOUNTER — Encounter: Payer: Self-pay | Admitting: Psychology

## 2008-02-03 ENCOUNTER — Ambulatory Visit: Payer: Self-pay | Admitting: Family Medicine

## 2008-02-03 LAB — CONVERTED CEMR LAB: Rapid Strep: NEGATIVE

## 2008-02-04 ENCOUNTER — Telehealth (INDEPENDENT_AMBULATORY_CARE_PROVIDER_SITE_OTHER): Payer: Self-pay | Admitting: Family Medicine

## 2008-02-09 ENCOUNTER — Emergency Department: Payer: Self-pay | Admitting: Emergency Medicine

## 2008-02-10 ENCOUNTER — Encounter: Payer: Self-pay | Admitting: Psychology

## 2008-02-11 ENCOUNTER — Ambulatory Visit: Payer: Self-pay | Admitting: Family Medicine

## 2008-02-11 ENCOUNTER — Ambulatory Visit: Payer: Self-pay | Admitting: Psychology

## 2008-02-11 ENCOUNTER — Telehealth: Payer: Self-pay | Admitting: *Deleted

## 2008-02-11 LAB — CONVERTED CEMR LAB
Ketones, urine, test strip: NEGATIVE
Nitrite: NEGATIVE
Specific Gravity, Urine: 1.015
WBC Urine, dipstick: NEGATIVE
pH: 8.5

## 2008-02-13 ENCOUNTER — Emergency Department (HOSPITAL_COMMUNITY): Admission: EM | Admit: 2008-02-13 | Discharge: 2008-02-14 | Payer: Self-pay | Admitting: Emergency Medicine

## 2008-02-15 ENCOUNTER — Encounter: Payer: Self-pay | Admitting: Psychology

## 2008-02-16 ENCOUNTER — Telehealth (INDEPENDENT_AMBULATORY_CARE_PROVIDER_SITE_OTHER): Payer: Self-pay | Admitting: *Deleted

## 2008-02-29 ENCOUNTER — Emergency Department (HOSPITAL_COMMUNITY): Admission: EM | Admit: 2008-02-29 | Discharge: 2008-03-01 | Payer: Self-pay | Admitting: Emergency Medicine

## 2008-03-03 ENCOUNTER — Ambulatory Visit: Payer: Self-pay | Admitting: Obstetrics and Gynecology

## 2008-03-04 ENCOUNTER — Emergency Department (HOSPITAL_COMMUNITY): Admission: EM | Admit: 2008-03-04 | Discharge: 2008-03-05 | Payer: Self-pay | Admitting: Emergency Medicine

## 2008-03-10 ENCOUNTER — Encounter: Payer: Self-pay | Admitting: Psychology

## 2008-03-30 ENCOUNTER — Inpatient Hospital Stay (HOSPITAL_COMMUNITY): Admission: AD | Admit: 2008-03-30 | Discharge: 2008-03-30 | Payer: Self-pay | Admitting: Obstetrics & Gynecology

## 2008-03-30 ENCOUNTER — Telehealth (INDEPENDENT_AMBULATORY_CARE_PROVIDER_SITE_OTHER): Payer: Self-pay | Admitting: *Deleted

## 2008-04-04 ENCOUNTER — Inpatient Hospital Stay (HOSPITAL_COMMUNITY): Admission: AD | Admit: 2008-04-04 | Discharge: 2008-04-04 | Payer: Self-pay | Admitting: Obstetrics & Gynecology

## 2008-04-04 ENCOUNTER — Ambulatory Visit: Payer: Self-pay | Admitting: Obstetrics & Gynecology

## 2008-04-04 ENCOUNTER — Ambulatory Visit (HOSPITAL_COMMUNITY): Admission: RE | Admit: 2008-04-04 | Discharge: 2008-04-04 | Payer: Self-pay | Admitting: Obstetrics & Gynecology

## 2008-04-26 ENCOUNTER — Emergency Department (HOSPITAL_COMMUNITY): Admission: EM | Admit: 2008-04-26 | Discharge: 2008-04-27 | Payer: Self-pay | Admitting: Emergency Medicine

## 2008-04-26 ENCOUNTER — Telehealth: Payer: Self-pay | Admitting: Family Medicine

## 2008-05-02 ENCOUNTER — Emergency Department (HOSPITAL_COMMUNITY): Admission: EM | Admit: 2008-05-02 | Discharge: 2008-05-02 | Payer: Self-pay | Admitting: *Deleted

## 2008-05-06 ENCOUNTER — Telehealth (INDEPENDENT_AMBULATORY_CARE_PROVIDER_SITE_OTHER): Payer: Self-pay | Admitting: *Deleted

## 2008-05-10 ENCOUNTER — Emergency Department (HOSPITAL_COMMUNITY): Admission: EM | Admit: 2008-05-10 | Discharge: 2008-05-10 | Payer: Self-pay | Admitting: *Deleted

## 2008-05-18 ENCOUNTER — Emergency Department (HOSPITAL_COMMUNITY): Admission: EM | Admit: 2008-05-18 | Discharge: 2008-05-19 | Payer: Self-pay | Admitting: Emergency Medicine

## 2008-05-23 ENCOUNTER — Telehealth: Payer: Self-pay | Admitting: Psychology

## 2008-06-27 ENCOUNTER — Emergency Department (HOSPITAL_COMMUNITY): Admission: EM | Admit: 2008-06-27 | Discharge: 2008-06-27 | Payer: Self-pay | Admitting: Emergency Medicine

## 2008-06-29 ENCOUNTER — Telehealth: Payer: Self-pay | Admitting: Family Medicine

## 2008-06-29 ENCOUNTER — Emergency Department (HOSPITAL_COMMUNITY): Admission: EM | Admit: 2008-06-29 | Discharge: 2008-06-29 | Payer: Self-pay | Admitting: Emergency Medicine

## 2008-07-04 ENCOUNTER — Telehealth (INDEPENDENT_AMBULATORY_CARE_PROVIDER_SITE_OTHER): Payer: Self-pay | Admitting: Family Medicine

## 2008-07-18 ENCOUNTER — Ambulatory Visit: Payer: Self-pay | Admitting: Family Medicine

## 2008-07-18 ENCOUNTER — Telehealth (INDEPENDENT_AMBULATORY_CARE_PROVIDER_SITE_OTHER): Payer: Self-pay | Admitting: *Deleted

## 2008-07-18 LAB — CONVERTED CEMR LAB
Bilirubin Urine: NEGATIVE
Glucose, Urine, Semiquant: NEGATIVE
Ketones, urine, test strip: NEGATIVE
Protein, U semiquant: NEGATIVE
Urobilinogen, UA: 0.2
pH: 6

## 2008-07-19 ENCOUNTER — Encounter: Payer: Self-pay | Admitting: Family Medicine

## 2008-07-19 ENCOUNTER — Telehealth: Payer: Self-pay | Admitting: Family Medicine

## 2008-07-20 ENCOUNTER — Emergency Department (HOSPITAL_COMMUNITY): Admission: EM | Admit: 2008-07-20 | Discharge: 2008-07-21 | Payer: Self-pay | Admitting: *Deleted

## 2008-07-20 ENCOUNTER — Telehealth: Payer: Self-pay | Admitting: Family Medicine

## 2008-07-24 ENCOUNTER — Telehealth (INDEPENDENT_AMBULATORY_CARE_PROVIDER_SITE_OTHER): Payer: Self-pay | Admitting: Family Medicine

## 2008-07-25 ENCOUNTER — Ambulatory Visit: Payer: Self-pay | Admitting: Family Medicine

## 2008-07-25 ENCOUNTER — Telehealth: Payer: Self-pay | Admitting: *Deleted

## 2008-07-25 ENCOUNTER — Encounter (INDEPENDENT_AMBULATORY_CARE_PROVIDER_SITE_OTHER): Payer: Self-pay | Admitting: Family Medicine

## 2008-07-25 LAB — CONVERTED CEMR LAB
Nitrite: NEGATIVE
Specific Gravity, Urine: 1.015
Urobilinogen, UA: 0.2
WBC Urine, dipstick: NEGATIVE

## 2008-08-03 ENCOUNTER — Telehealth (INDEPENDENT_AMBULATORY_CARE_PROVIDER_SITE_OTHER): Payer: Self-pay | Admitting: Family Medicine

## 2008-08-03 ENCOUNTER — Emergency Department (HOSPITAL_COMMUNITY): Admission: EM | Admit: 2008-08-03 | Discharge: 2008-08-03 | Payer: Self-pay | Admitting: Emergency Medicine

## 2008-08-09 ENCOUNTER — Ambulatory Visit: Payer: Self-pay | Admitting: Family Medicine

## 2008-08-09 LAB — CONVERTED CEMR LAB
Bilirubin Urine: NEGATIVE
Glucose, Urine, Semiquant: NEGATIVE
Protein, U semiquant: NEGATIVE
Specific Gravity, Urine: 1.025
WBC Urine, dipstick: NEGATIVE
pH: 5.5

## 2008-08-15 ENCOUNTER — Telehealth (INDEPENDENT_AMBULATORY_CARE_PROVIDER_SITE_OTHER): Payer: Self-pay | Admitting: *Deleted

## 2008-08-25 ENCOUNTER — Ambulatory Visit: Payer: Self-pay | Admitting: Family Medicine

## 2008-09-01 ENCOUNTER — Ambulatory Visit: Payer: Self-pay | Admitting: Psychology

## 2008-09-08 ENCOUNTER — Ambulatory Visit: Payer: Self-pay | Admitting: Psychology

## 2008-09-15 ENCOUNTER — Ambulatory Visit: Payer: Self-pay | Admitting: Psychology

## 2008-09-21 ENCOUNTER — Telehealth: Payer: Self-pay | Admitting: Family Medicine

## 2008-09-21 ENCOUNTER — Telehealth (INDEPENDENT_AMBULATORY_CARE_PROVIDER_SITE_OTHER): Payer: Self-pay | Admitting: Family Medicine

## 2008-09-29 ENCOUNTER — Telehealth: Payer: Self-pay | Admitting: Psychology

## 2008-10-11 ENCOUNTER — Telehealth: Payer: Self-pay | Admitting: Psychology

## 2008-10-24 ENCOUNTER — Encounter: Payer: Self-pay | Admitting: Psychology

## 2008-11-02 ENCOUNTER — Encounter: Payer: Self-pay | Admitting: Psychology

## 2008-11-07 ENCOUNTER — Telehealth: Payer: Self-pay | Admitting: Psychology

## 2008-11-23 ENCOUNTER — Ambulatory Visit: Payer: Self-pay | Admitting: Family Medicine

## 2008-11-23 LAB — CONVERTED CEMR LAB
Blood in Urine, dipstick: NEGATIVE
Glucose, Urine, Semiquant: NEGATIVE
Nitrite: NEGATIVE
Specific Gravity, Urine: 1.02
WBC Urine, dipstick: NEGATIVE
pH: 6

## 2008-12-05 ENCOUNTER — Telehealth: Payer: Self-pay | Admitting: *Deleted

## 2008-12-09 ENCOUNTER — Telehealth: Payer: Self-pay | Admitting: *Deleted

## 2008-12-09 ENCOUNTER — Ambulatory Visit: Payer: Self-pay | Admitting: Family Medicine

## 2008-12-09 LAB — CONVERTED CEMR LAB
Ketones, urine, test strip: NEGATIVE
Nitrite: NEGATIVE
Specific Gravity, Urine: 1.015
Urobilinogen, UA: 0.2
WBC Urine, dipstick: NEGATIVE

## 2008-12-10 ENCOUNTER — Encounter: Payer: Self-pay | Admitting: Family Medicine

## 2008-12-10 LAB — CONVERTED CEMR LAB
BUN: 12 mg/dL (ref 6–23)
Chloride: 105 meq/L (ref 96–112)
Glucose, Bld: 80 mg/dL (ref 70–99)
Potassium: 4 meq/L (ref 3.5–5.3)
Sodium: 140 meq/L (ref 135–145)

## 2008-12-12 ENCOUNTER — Telehealth (INDEPENDENT_AMBULATORY_CARE_PROVIDER_SITE_OTHER): Payer: Self-pay | Admitting: Family Medicine

## 2008-12-12 ENCOUNTER — Emergency Department (HOSPITAL_COMMUNITY): Admission: EM | Admit: 2008-12-12 | Discharge: 2008-12-13 | Payer: Self-pay | Admitting: Emergency Medicine

## 2008-12-14 ENCOUNTER — Encounter: Payer: Self-pay | Admitting: Family Medicine

## 2008-12-15 ENCOUNTER — Encounter (INDEPENDENT_AMBULATORY_CARE_PROVIDER_SITE_OTHER): Payer: Self-pay | Admitting: Family Medicine

## 2008-12-19 ENCOUNTER — Telehealth (INDEPENDENT_AMBULATORY_CARE_PROVIDER_SITE_OTHER): Payer: Self-pay | Admitting: Family Medicine

## 2008-12-19 ENCOUNTER — Ambulatory Visit: Payer: Self-pay | Admitting: Family Medicine

## 2008-12-19 ENCOUNTER — Encounter (INDEPENDENT_AMBULATORY_CARE_PROVIDER_SITE_OTHER): Payer: Self-pay | Admitting: Family Medicine

## 2008-12-19 ENCOUNTER — Ambulatory Visit (HOSPITAL_COMMUNITY): Admission: RE | Admit: 2008-12-19 | Discharge: 2008-12-19 | Payer: Self-pay | Admitting: Family Medicine

## 2008-12-19 LAB — CONVERTED CEMR LAB
Bilirubin Urine: NEGATIVE
Glucose, Urine, Semiquant: NEGATIVE
Protein, U semiquant: 30
Urobilinogen, UA: 0.2
WBC Urine, dipstick: NEGATIVE
pH: 6

## 2008-12-20 ENCOUNTER — Telehealth: Payer: Self-pay | Admitting: *Deleted

## 2009-01-25 ENCOUNTER — Encounter (INDEPENDENT_AMBULATORY_CARE_PROVIDER_SITE_OTHER): Payer: Self-pay | Admitting: Family Medicine

## 2009-02-02 ENCOUNTER — Telehealth: Payer: Self-pay | Admitting: Family Medicine

## 2009-02-03 ENCOUNTER — Ambulatory Visit (HOSPITAL_COMMUNITY): Admission: RE | Admit: 2009-02-03 | Discharge: 2009-02-03 | Payer: Self-pay | Admitting: Family Medicine

## 2009-02-03 ENCOUNTER — Ambulatory Visit: Payer: Self-pay | Admitting: Family Medicine

## 2009-02-09 ENCOUNTER — Telehealth: Payer: Self-pay | Admitting: Family Medicine

## 2009-02-12 IMAGING — CT CT PELVIS W/O CM
2 of 4 series · 17 of 46 positions shown, 19 images · non-contrast
Comparison: Noncontrast abdominal pelvic CT 02/13/2008.

CT ABDOMEN

CLINICAL DATA: Left flank and back pain.  Hematuria.  Question
ureteral calculus.

CT ABDOMEN AND PELVIS WITHOUT CONTRAST
TECHNIQUE: Multidetector CT imaging of the abdomen and pelvis was
performed following the standard
protocol without intravenous contrast.

[Series 2: stone <(id) w/o a & p (id) · axial · non-contrast · 0.70mm/px · z∈[-420,-30]mm · 14 of 88 slices shown, 16 images]
[im 5/88  soft-tissue]
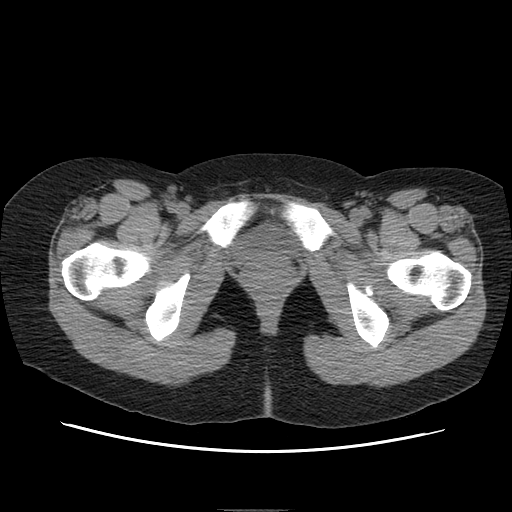
[im 5/88  bone]
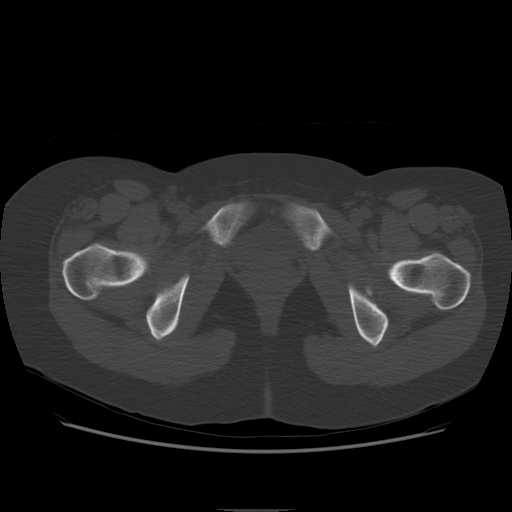
[im 13/88  soft-tissue]
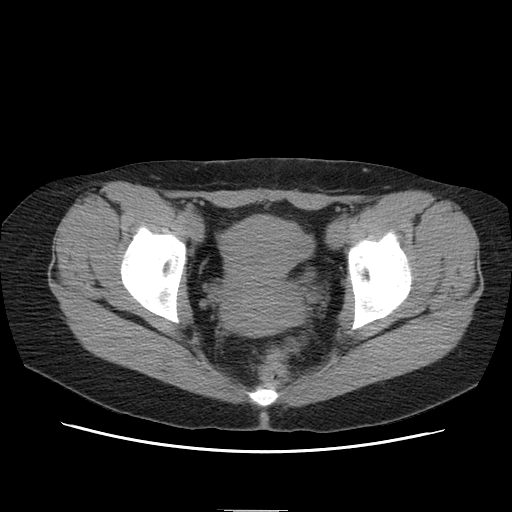
[im 17/88  soft-tissue]
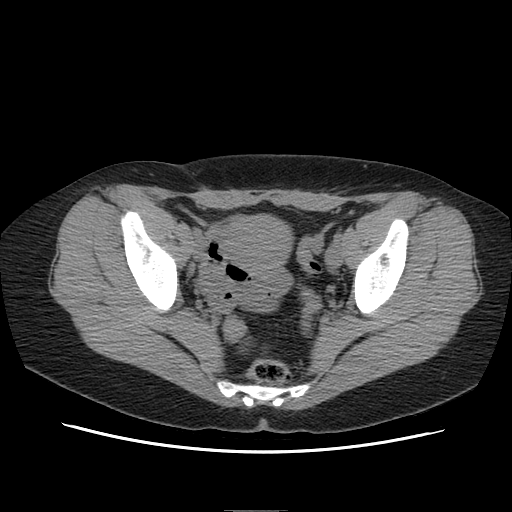
[im 25/88  soft-tissue]
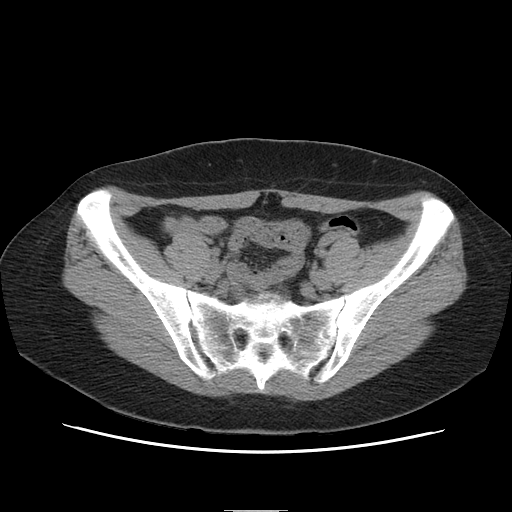
[im 30/88  soft-tissue]
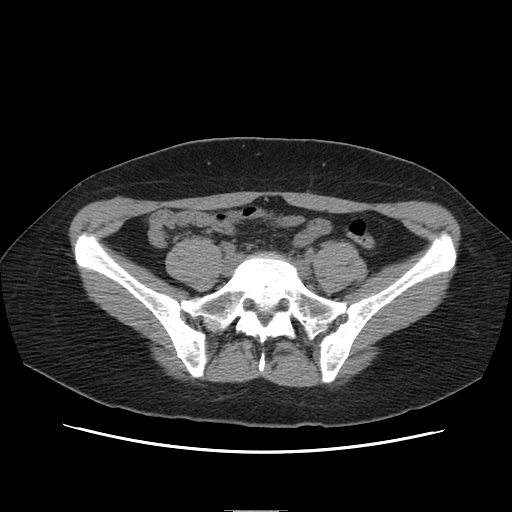
[im 34/88  soft-tissue]
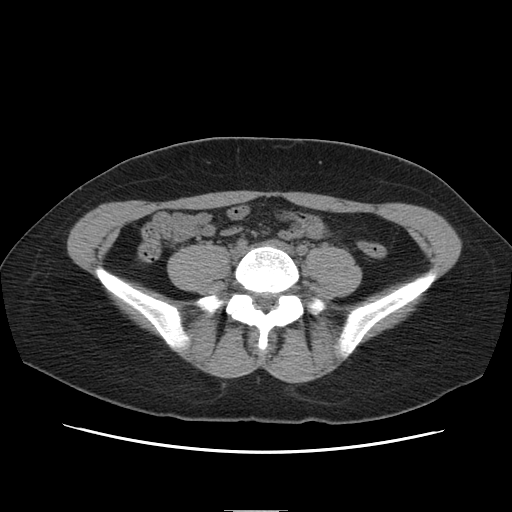
[im 42/88  soft-tissue]
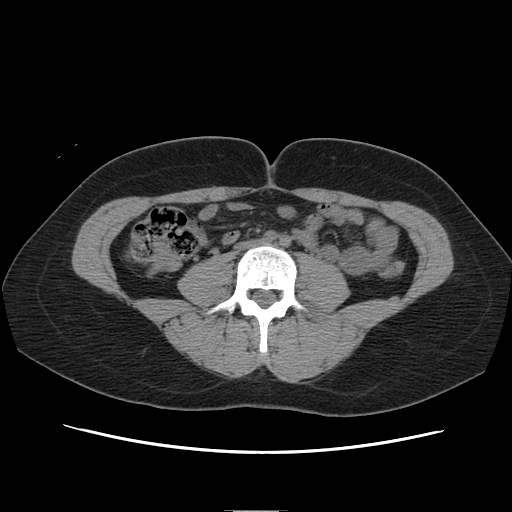
[im 46/88  soft-tissue]
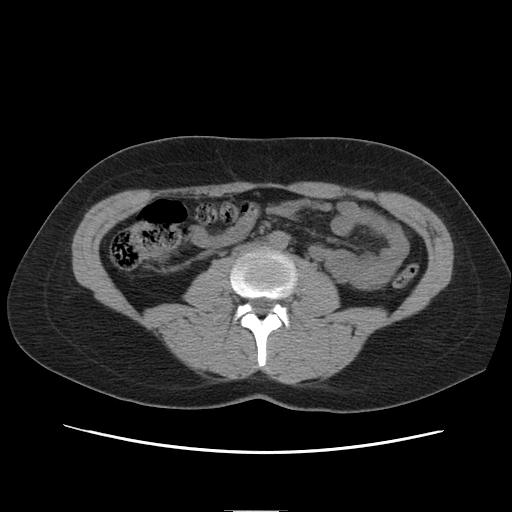
[im 54/88  soft-tissue]
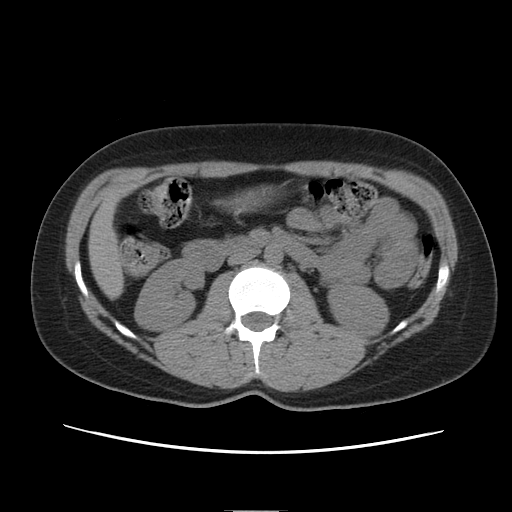
[im 54/88  bone]
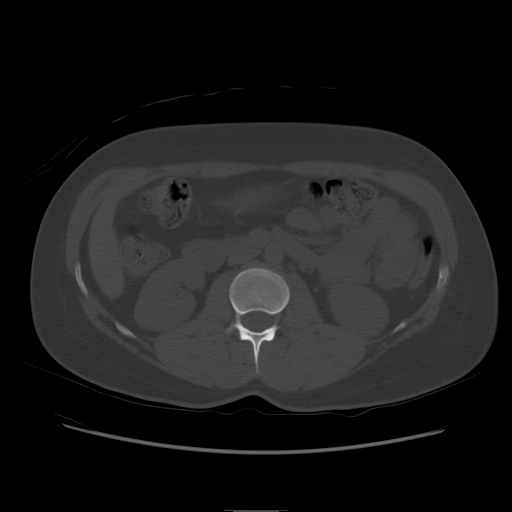
[im 59/88  soft-tissue]
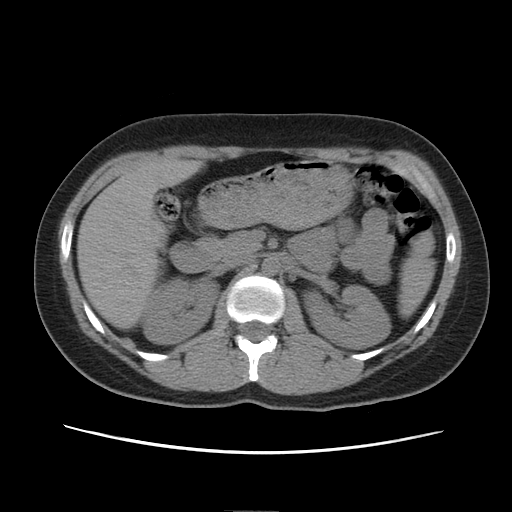
[im 67/88  soft-tissue]
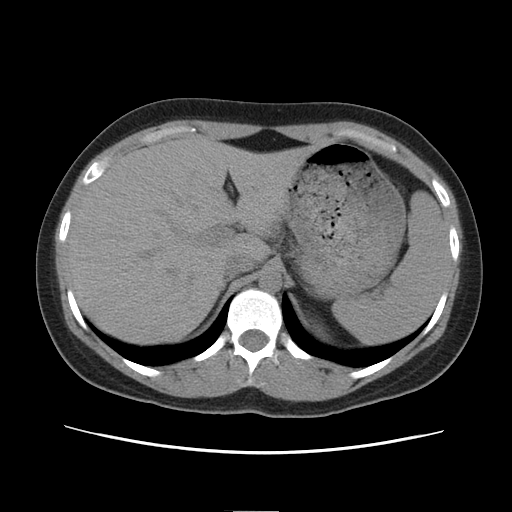
[im 71/88  soft-tissue]
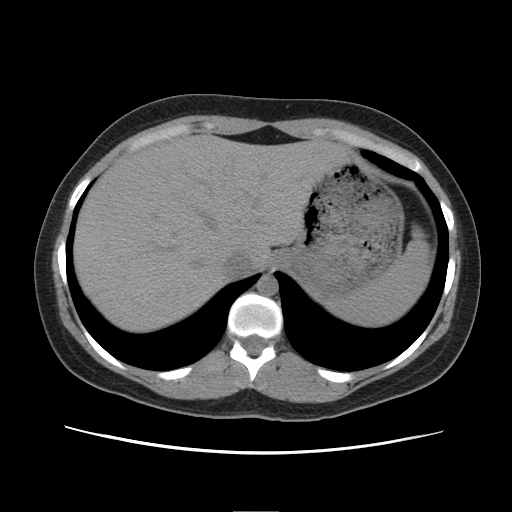
[im 75/88  soft-tissue]
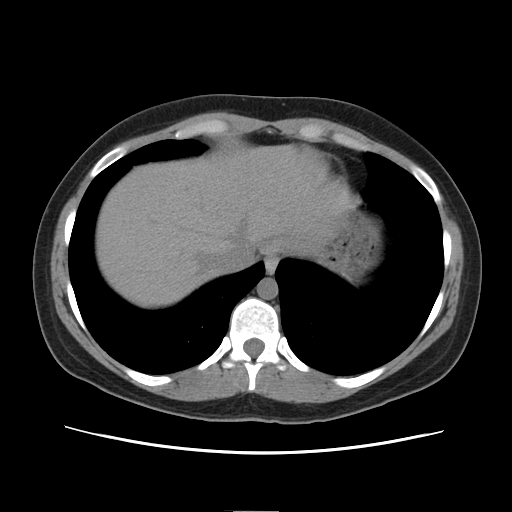
[im 83/88  soft-tissue]
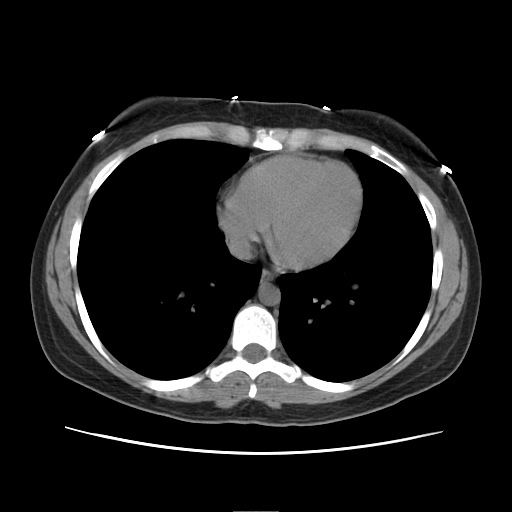

[Series 401: reformatted · coronal · 0.70mm/px · 3 of 67 slices shown]
[im 23/67  soft-tissue]
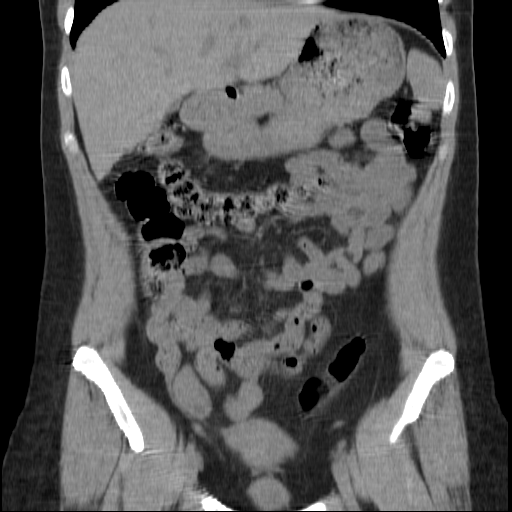
[im 30/67  soft-tissue]
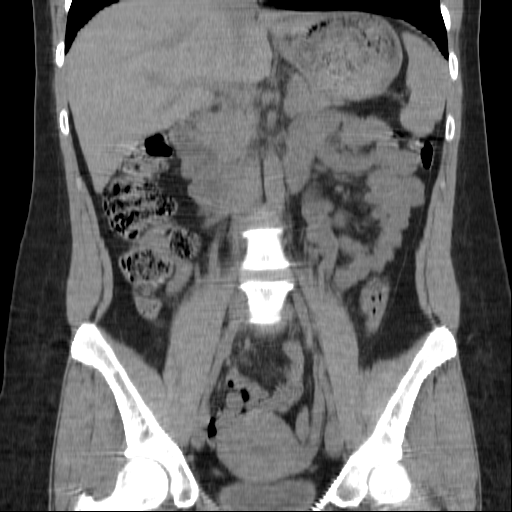
[im 37/67  soft-tissue]
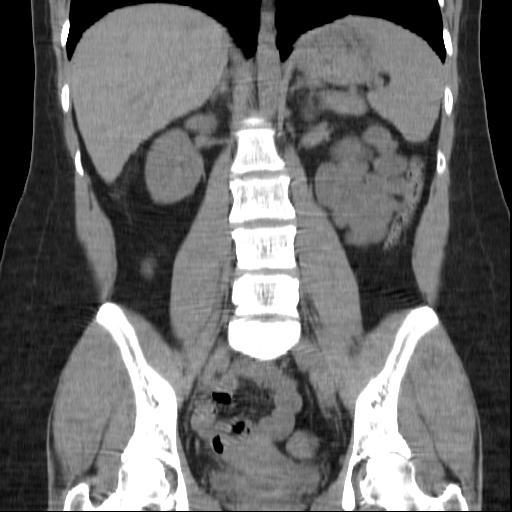

[17 of 46 positions shown; findings below may reference images not displayed]

FINDINGS: The patient has not developed any discrete renal or
ureteral calculi since the prior examination.  There is no
hydronephrosis or perinephric soft tissue stranding.  No focal
renal abnormalities are seen.

The lung bases are clear.  There is no pleural effusion.  The
liver, spleen, gallbladder, pancreas and adrenal glands appear
unremarkable as imaged in the noncontrast state.  No intra-
abdominal inflammatory change are seen.
IMPRESSION: Stable normal noncontrast examination.  No evidence of urinary
tract calculus or hydronephrosis.

CT PELVIS
FINDINGS: Distally, the ureters are normal in caliber.  There is
no evidence of ureteral or bladder calculus.  Prominence of the
right ovary noted on prior examination has resolved.  There is no
adnexal mass on the current study.
IMPRESSION: No acute pelvic findings.

## 2009-04-06 ENCOUNTER — Telehealth: Payer: Self-pay | Admitting: Family Medicine

## 2009-05-25 ENCOUNTER — Telehealth: Payer: Self-pay | Admitting: Family Medicine

## 2009-05-29 ENCOUNTER — Telehealth: Payer: Self-pay | Admitting: Family Medicine

## 2009-06-02 ENCOUNTER — Telehealth: Payer: Self-pay | Admitting: *Deleted

## 2009-07-01 IMAGING — CR DG ABDOMEN 1V
1 series · 1 of 1 positions shown · non-contrast
Comparison: CT urogram 03/04/2008.

CLINICAL DATA: Urinary tract infection.  Hematuria.  Abdominal
pain.

ABDOMEN - 1 VIEW 07/21/2008:

[t abdomen supine]
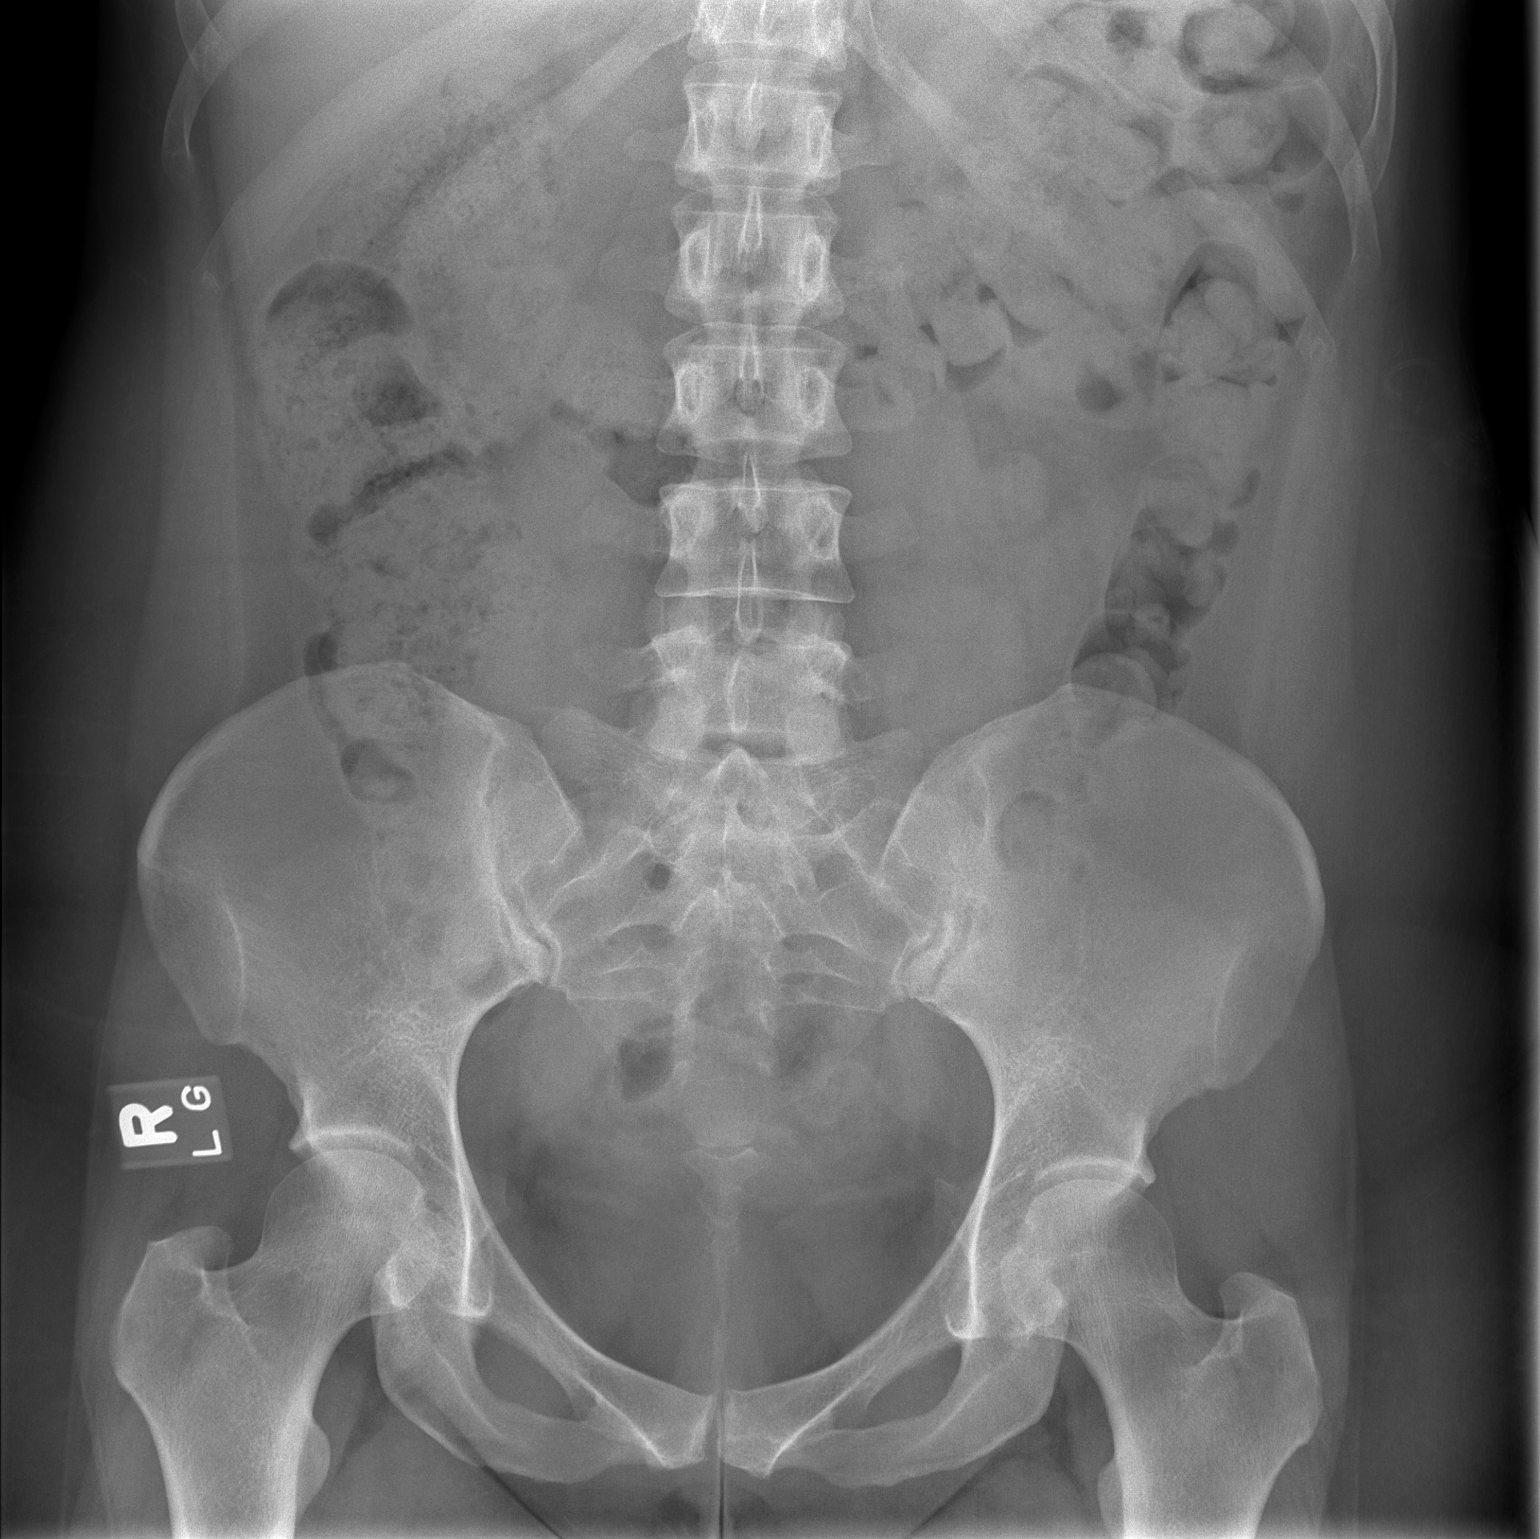

[1 of 1 positions shown; findings below may reference images not displayed]

FINDINGS: Bowel gas pattern unremarkable without evidence of
obstruction or significant ileus.  Moderately large amount of stool
in the colon from the cecum to the mid descending colon.  No small
bowel gas.  Visible psoas margins.  No abnormal calcifications.
Regional skeleton unremarkable.
IMPRESSION: No acute abdominal abnormality apart from possible constipation.

## 2009-08-02 ENCOUNTER — Emergency Department (HOSPITAL_BASED_OUTPATIENT_CLINIC_OR_DEPARTMENT_OTHER): Admission: EM | Admit: 2009-08-02 | Discharge: 2009-08-02 | Payer: Self-pay | Admitting: Emergency Medicine

## 2009-08-02 ENCOUNTER — Ambulatory Visit: Payer: Self-pay | Admitting: Diagnostic Radiology

## 2009-08-09 ENCOUNTER — Telehealth: Payer: Self-pay | Admitting: Family Medicine

## 2009-08-10 ENCOUNTER — Ambulatory Visit: Payer: Self-pay | Admitting: Family Medicine

## 2009-09-01 ENCOUNTER — Telehealth: Payer: Self-pay | Admitting: Family Medicine

## 2009-12-18 ENCOUNTER — Ambulatory Visit: Payer: Self-pay | Admitting: Family Medicine

## 2009-12-20 ENCOUNTER — Ambulatory Visit: Payer: Self-pay | Admitting: Family Medicine

## 2010-01-11 ENCOUNTER — Telehealth: Payer: Self-pay | Admitting: Family Medicine

## 2010-01-17 ENCOUNTER — Encounter: Payer: Self-pay | Admitting: Family Medicine

## 2010-01-17 ENCOUNTER — Ambulatory Visit: Payer: Self-pay | Admitting: Family Medicine

## 2010-01-22 ENCOUNTER — Telehealth: Payer: Self-pay | Admitting: Family Medicine

## 2010-02-12 ENCOUNTER — Emergency Department (HOSPITAL_COMMUNITY): Admission: EM | Admit: 2010-02-12 | Discharge: 2010-02-13 | Payer: Self-pay | Admitting: Emergency Medicine

## 2010-02-16 ENCOUNTER — Encounter: Payer: Self-pay | Admitting: Family Medicine

## 2010-02-16 ENCOUNTER — Telehealth: Payer: Self-pay | Admitting: Family Medicine

## 2010-02-16 ENCOUNTER — Ambulatory Visit: Payer: Self-pay | Admitting: Family Medicine

## 2010-03-15 ENCOUNTER — Telehealth: Payer: Self-pay | Admitting: Family Medicine

## 2010-04-25 ENCOUNTER — Ambulatory Visit: Payer: Self-pay | Admitting: Family Medicine

## 2010-05-08 ENCOUNTER — Ambulatory Visit: Payer: Self-pay | Admitting: Family Medicine

## 2010-05-08 ENCOUNTER — Telehealth: Payer: Self-pay | Admitting: Family Medicine

## 2010-05-08 DIAGNOSIS — M25519 Pain in unspecified shoulder: Secondary | ICD-10-CM

## 2010-05-09 ENCOUNTER — Telehealth: Payer: Self-pay | Admitting: Family Medicine

## 2010-05-22 ENCOUNTER — Telehealth: Payer: Self-pay | Admitting: Family Medicine

## 2010-05-25 ENCOUNTER — Telehealth: Payer: Self-pay | Admitting: Family Medicine

## 2010-05-28 ENCOUNTER — Telehealth: Payer: Self-pay | Admitting: Family Medicine

## 2010-06-21 ENCOUNTER — Encounter: Payer: Self-pay | Admitting: Family Medicine

## 2010-07-19 ENCOUNTER — Telehealth: Payer: Self-pay | Admitting: Family Medicine

## 2010-07-26 ENCOUNTER — Emergency Department (HOSPITAL_COMMUNITY): Admission: EM | Admit: 2010-07-26 | Discharge: 2010-01-27 | Payer: Self-pay | Admitting: Emergency Medicine

## 2010-07-27 ENCOUNTER — Telehealth: Payer: Self-pay | Admitting: Family Medicine

## 2010-08-03 ENCOUNTER — Ambulatory Visit: Payer: Self-pay | Admitting: Family Medicine

## 2010-08-03 DIAGNOSIS — J069 Acute upper respiratory infection, unspecified: Secondary | ICD-10-CM | POA: Insufficient documentation

## 2010-08-15 ENCOUNTER — Encounter: Payer: Self-pay | Admitting: Family Medicine

## 2010-08-15 ENCOUNTER — Ambulatory Visit
Admission: RE | Admit: 2010-08-15 | Discharge: 2010-08-15 | Payer: Self-pay | Source: Home / Self Care | Attending: Family Medicine | Admitting: Family Medicine

## 2010-08-15 DIAGNOSIS — R300 Dysuria: Secondary | ICD-10-CM | POA: Insufficient documentation

## 2010-08-15 DIAGNOSIS — M545 Low back pain: Secondary | ICD-10-CM

## 2010-08-15 LAB — CONVERTED CEMR LAB
Bilirubin Urine: NEGATIVE
GC Probe Amp, Genital: NEGATIVE
Nitrite: NEGATIVE
Urobilinogen, UA: 0.2

## 2010-08-24 ENCOUNTER — Ambulatory Visit: Admission: RE | Admit: 2010-08-24 | Discharge: 2010-08-24 | Payer: Self-pay | Source: Home / Self Care

## 2010-08-24 DIAGNOSIS — M719 Bursopathy, unspecified: Secondary | ICD-10-CM

## 2010-08-24 DIAGNOSIS — M67919 Unspecified disorder of synovium and tendon, unspecified shoulder: Secondary | ICD-10-CM | POA: Insufficient documentation

## 2010-08-31 ENCOUNTER — Encounter: Payer: Self-pay | Admitting: *Deleted

## 2010-09-07 ENCOUNTER — Encounter: Payer: Self-pay | Admitting: Family Medicine

## 2010-09-07 DIAGNOSIS — M542 Cervicalgia: Secondary | ICD-10-CM | POA: Insufficient documentation

## 2010-09-09 ENCOUNTER — Encounter: Payer: Self-pay | Admitting: Sports Medicine

## 2010-09-09 ENCOUNTER — Encounter: Payer: Self-pay | Admitting: Family Medicine

## 2010-09-09 ENCOUNTER — Encounter: Payer: Self-pay | Admitting: Obstetrics

## 2010-09-10 ENCOUNTER — Telehealth: Payer: Self-pay | Admitting: Family Medicine

## 2010-09-10 ENCOUNTER — Ambulatory Visit (HOSPITAL_COMMUNITY)
Admission: RE | Admit: 2010-09-10 | Discharge: 2010-09-10 | Payer: Self-pay | Source: Home / Self Care | Attending: Family Medicine | Admitting: Family Medicine

## 2010-09-10 DIAGNOSIS — M75100 Unspecified rotator cuff tear or rupture of unspecified shoulder, not specified as traumatic: Secondary | ICD-10-CM | POA: Insufficient documentation

## 2010-09-17 ENCOUNTER — Telehealth: Payer: Self-pay | Admitting: Family Medicine

## 2010-09-18 NOTE — Letter (Signed)
Summary: Generic Letter  [facility]  [address]   [place], [place] [address]   Phone: [phone]  Fax: [phone]    01/17/2010  Khaleah Fitterer [address] [place], [place]  [address]     To Whom this May Concern,       Ms. Saltz is under my care at [facility]. She has an acute shoulder/neck injury therefore limiting her skills. 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  She may participate in other activities and job duties.  For questions please call [phone].         Sincerely,   Milinda Antis MD

## 2010-09-18 NOTE — Assessment & Plan Note (Signed)
Summary: fingers numb for 2 days/eo   Vital Signs:  Patient profile:   35 year old female Height:      64 inches Weight:      177 pounds BMI:     30.49 Temp:     98.4 degrees F oral Pulse rate:   103 / minute BP sitting:   118 / 83  (right arm) Cuff size:   regular  Vitals Entered By: Tessie Fass CMA (January 17, 2010 11:12 AM) CC: right shoulder pain x 1 week. right forearm, hand and fingers numb x 2 days. Is Patient Diabetic? No Pain Assessment Patient in pain? yes     Location: right shoulder Intensity: 5   Primary Care Provider:  Milinda Antis MD  CC:  right shoulder pain x 1 week. 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 Denies any color change in hand or arm. Has been taking Ibuprofen 800mg  two times a day which helps a little Pain is worst at night and she often feels very stiff. Denies dropping any cups etc with numbness. Left hand dominant  Current Medications (verified): 1)  Alprazolam 2 Mg Tabs (Alprazolam) .... 1/4 Tab Every 6 Hours As Needed 2)  Flexeril 5 Mg Tabs (Cyclobenzaprine Hcl) .Marland Kitchen.. 1 By Mouth Three Times A Day As Needed Spasm  Allergies (verified): 1)  ! Sulfa 2)  ! Tramadol Hcl (Tramadol Hcl)  Physical Exam  General:  NAD, Vital signs noted  Neck:  + Spurlings Limited ROM, pain with rotation to right at approx 75 degrees from midline no nuchal rigidity Msk:  Right  Shoulder: Inspection reveals no abnormalities, atrophy or asymmetry. No swelling  TTP over right cervical paraspinals, rhomboids and deltoid, ?TTP over bicipital groove mild TTP over clavicular region  Rotator cuff strength decreased right side compared to left  + empty can sign on right side' + Neer  sign on right Speeds test normal  No labral pathology noted with negative Obrien's, negative clunk and good stability. Normal scapular function observed.  no drop arm sign.     Neurologic:  decreased sensation in right hand   Impression & Recommendations:  Problem # 1:  NECK PAIN, RIGHT (ICD-723.1) Assessment New Based on exam concerned for possible nerve impingment , cervical disc although unknown injury mechanism, also with muscle spasm. Will treat pain and give muscle relaxant. Obtain complete view of neck Her updated medication list for this problem includes:    Flexeril 5 Mg Tabs (Cyclobenzaprine hcl) .Marland Kitchen... 1 by mouth three times a day as needed spasm    Vicodin 5-500 Mg Tabs (Hydrocodone-acetaminophen) .Marland Kitchen... 1 by mouth two times a day x 2 weeks  Orders: Radiology other (Radiology Other) [facility]- Est  Level 4 ([address])  Problem # 2:  SHOULDER PAIN, RIGHT (ICD-719.41) Assessment: New  Exam concerning for neck pathology, however ? shoulder pathology, rotator cuff strain, less likley a tear. Will treat symptoms, check neck films if pain still persistent will refer to [facility] for ultrasound, trigger point injections. Pt declined injections today Her updated medication list for this problem includes:    Flexeril 5 Mg Tabs (Cyclobenzaprine hcl) .Marland Kitchen... 1 by mouth three times a day as needed spasm  Vicodin 5-500 Mg Tabs (Hydrocodone-acetaminophen) .Marland Kitchen... 1 by mouth two times a day x 2 weeks  Orders: [facility]- Est  Level 4 ([address])  Complete Medication List: 1)  Alprazolam 2 Mg Tabs (Alprazolam) .... 1/4 tab every 6 hours as needed 2)  Flexeril 5 Mg Tabs (Cyclobenzaprine hcl) .Marland Kitchen.. 1 by mouth three times a day as needed spasm 3)  Vicodin 5-500 Mg Tabs (Hydrocodone-acetaminophen) .Marland Kitchen.. 1 by mouth two times a day x 2 weeks  Patient Instructions: 1)  Continue the Ibuprofen 600mg  tablet Three times  a day 2)  Vicodin twice a day as needed 3)  Take the flexeril 5mg  4)  Please go get the x-rays for  your neck 5)  If your pain is still persistant come back in 1 week and we will send you to sports medicine  Prescriptions: VICODIN 5-500 MG TABS (HYDROCODONE-ACETAMINOPHEN) 1 by mouth two times a day x 2 weeks  #30 x 0   Entered and Authorized by:   Milinda Antis MD   Signed by:   Milinda Antis MD on 01/17/2010   Method used:   Handwritten   RxID:   [id number] FLEXERIL 5 MG TABS (CYCLOBENZAPRINE HCL) 1 by mouth three times a day as needed spasm  #90 x 1   Entered and Authorized by:   Milinda Antis MD   Signed by:   Milinda Antis MD on 01/17/2010   Method used:   [facility]        [facility] [address] [facility] [address] [phone]* (retail)       [address]       [place], [place]  [address]       Ph: [phone] or [phone]       Fax: [phone]   RxID:   [id number]

## 2010-09-18 NOTE — Progress Notes (Signed)
Summary: triage  Phone Note Call from Patient Call back at Home Phone [phone]   Caller: Patient Summary of Call: Pt has sore throat and also children are sick can they be worked in tomorrow morning.  Riki Rusk DOB 09/22/02 and Gracie DOB 07/06/04. Initial call taken by: De Nurse,  May 28, 2010 1:47 PM  Follow-up for Phone Call        LM Follow-up by: Golden Circle RN,  May 28, 2010 1:55 PM  Additional Follow-up for Phone Call Additional follow up Details #1::        I do not have 3 appts together. she is going to go to [facility] for herself & 2 children Additional Follow-up by: Golden Circle RN,  May 28, 2010 2:06 PM    Additional Follow-up for Phone Call Additional follow up Details #2::    spouse called back upset that we could not see his 3 children & wife. explained we had a appts widely spaced. one at 11:15, one at 2:30 & opne at 4:15. suggested he take everybody to UC since they all have fevers & are coughing. states he would call in am to see if there were any cancellations Follow-up by: Golden Circle RN,  May 28, 2010 2:16 PM

## 2010-09-18 NOTE — Assessment & Plan Note (Signed)
Summary: READ PPD/KH  Nurse Visit   Allergies: No Known Drug Allergies  PPD Results    Date of reading: 12/20/2009    Results: 0 mm    Interpretation: negative  Orders Added: 1)  No Charge Patient Arrived (NCPA0) [NCPA0]

## 2010-09-18 NOTE — Progress Notes (Signed)
  Phone Note Call from Patient   Caller: Patient Call For: [phone] Summary of Call: Patient need another type of pain med because cannot take Flexeril while working and the steroid is not working at all.  Please have physician call in another type of meds for patient.  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 Initial call taken by: Britta Mccreedy mcgregor  Follow-up for Phone Call        will forward to MD Follow-up by: Theresia Lo RN,  May 09, 2010 10:40 AM  Additional Follow-up for Phone Call Additional follow up Details #1::        Pt seen by Dr. Tora Kindred, I spoke with her. She can take Ibupofen every 6 hours, but narcotic meds not needed. She is also suppose to be scheduled for PT. She can take the flexeril at night Additional Follow-up by: Milinda Antis MD,  May 09, 2010 1:44 PM    New/Updated Medications: IBUPROFEN 600 MG TABS (IBUPROFEN) 1 by mouth q hrs as needed pain Prescriptions: IBUPROFEN 600 MG TABS (IBUPROFEN) 1 by mouth q hrs as needed pain  #40 x 0   Entered and Authorized by:   Milinda Antis MD   Signed by:   Milinda Antis MD on 05/09/2010   Method used:   [facility]        [facility]  [address] [phone]* (retail)       [address]       [place], [place]  [address]       Ph: [phone]       Fax: [phone]   RxID:   [id number]

## 2010-09-18 NOTE — Progress Notes (Signed)
Summary: Rx Req  Phone Note Refill Request Call back at Home Phone [phone] Message from:  Patient  Refills Requested: Medication #1:  ALPRAZOLAM 2 MG TABS 1/4 tab every 6 hours as needed Initial call taken by: Clydell Hakim,  May 22, 2010 11:46 AM    Prescriptions: ALPRAZOLAM 2 MG TABS (ALPRAZOLAM) 1/4 tab every 6 hours as needed  #45 x 0   Entered and Authorized by:   Milinda Antis MD   Signed by:   Milinda Antis MD on 05/22/2010   Method used:   Handwritten   RxID:   [id number]

## 2010-09-18 NOTE — Progress Notes (Signed)
Summary: triage  Phone Note Call from Patient Call back at [phone]   Caller: Patient Summary of Call: Bad rash from head to toe asking to be seen this am. Initial call taken by: Clydell Hakim,  February 16, 2010 8:33 AM  Follow-up for Phone Call        started yesterday. "little bumps" with some larger bumps. even in scalp & behind ears. says "I can't stand it"  denies contact with plants outside. asked her to come now & will be seen by pcp Follow-up by: Golden Circle RN,  February 16, 2010 8:51 AM

## 2010-09-18 NOTE — Progress Notes (Signed)
Summary: refill  Phone Note Refill Request Call back at [phone] Message from:  Patient  Refills Requested: Medication #1:  ALPRAZOLAM 2 MG TABS 1/4 tab every 6 hours as needed. Please call when ready  Initial call taken by: De Nurse,  September 01, 2009 9:46 AM  Follow-up for Phone Call        to pcp Follow-up by: Golden Circle RN,  September 01, 2009 9:50 AM  Additional Follow-up for Phone Call Additional follow up Details #1::        Pt can pick up Additional Follow-up by: Milinda Antis MD,  September 01, 2009 10:26 AM    Prescriptions: ALPRAZOLAM 2 MG TABS (ALPRAZOLAM) 1/4 tab every 6 hours as needed  #45 x 1   Entered and Authorized by:   Milinda Antis MD   Signed by:   Milinda Antis MD on 09/01/2009   Method used:   Handwritten   RxID:   [id number]

## 2010-09-18 NOTE — Miscellaneous (Signed)
Summary: update problem list  Clinical Lists Changes  Problems: Removed problem of SHOULDER PAIN, LEFT (ICD-719.41) Removed problem of NONALLOPATHIC LESION OF UPPER EXTREMITIES NEC (ICD-739.7) Removed problem of SKIN RASH (ICD-782.1) Removed problem of SHOULDER PAIN, RIGHT (ICD-719.41) Removed problem of NECK PAIN, RIGHT (ICD-723.1) Removed problem of SCREENING EXAMINATION FOR PULMONARY TUBERCULOSIS (ICD-V74.1) Removed problem of TINEA CORPORIS (ICD-110.5) Removed problem of PHARYNGITIS (ICD-462) Removed problem of KNEE SPRAIN, RIGHT, MEDIAL COLLATERAL LIGAMENT (ICD-844.1) Removed problem of KNEE PAIN, RIGHT, ACUTE (ICD-719.46) Removed problem of CALCULUS OF KIDNEY (ICD-592.0) Removed problem of FLANK PAIN, RIGHT (ICD-789.09) Removed problem of DYSURIA (ICD-788.1) Removed problem of GROSS HEMATURIA (ICD-599.71) Removed problem of PSYCHOSOCIAL PROBLEM (ICD-V62.9) Removed problem of EXCESSIVE OR FREQUENT MENSTRUATION (ICD-626.2) Observations: Added new observation of PAST MED HX: [id number], h/o exercise-induced asthma panic disorder chonic flank pain and dysuria Sprain RIght MCL  (06/21/2010 11:16)      Past Medical History:    [id number], h/o exercise-induced asthma    panic disorder    chonic flank pain and dysuria    Sprain RIght MCL

## 2010-09-18 NOTE — Progress Notes (Signed)
Summary: Xanax refill  Phone Note Call from Patient   Caller: Spouse Call For: [phone] Summary of Call: Pt's rx was not signed by provider.  Pharmacy said they have tried to call and get verbal.  Please call patient back and get name of pharmacy or rewrite rx and have pt return old one. Initial call taken by: Abundio Miu,  May 25, 2010 12:04 PM  Follow-up for Phone Call        Please have pt return the other prescription, I will write another one and place in the to be called bin Follow-up by: Milinda Antis MD,  May 25, 2010 12:11 PM      Note Previous prescription written not signed Given another prescription, As I am not available this pm to sign the orignial one from 10/4 [facility] MD  May 25, 2010 12:12 PM  Appended Document: Xanax refill pharamcy called & had the unsigned rx. asked if provider wrote it. told them yes. they are going to fill it. I destroyed the new one md had written. it was late in the day & pt did not have time to go pick up the original rx & bring it here to exchange

## 2010-09-18 NOTE — Progress Notes (Signed)
Summary: Rx Req  Phone Note Refill Request Call back at [phone] Message from:  Patient  Refills Requested: Medication #1:  ALPRAZOLAM 2 MG TABS 1/4 tab every 6 hours as needed [facility] [address]  Initial call taken by: Clydell Hakim,  March 15, 2010 2:19 PM  Follow-up for Phone Call        Printed script for patient (note printer error x2 - not printed on secure paper) for patient to pick up today. In to be called pile.  Follow-up by: Bobby Rumpf  MD,  March 16, 2010 9:56 AM  Additional Follow-up for Phone Call Additional follow up Details #1::        Pt notified rx ready. Additional Follow-up by: Clydell Hakim,  March 16, 2010 11:51 AM    Prescriptions: ALPRAZOLAM 2 MG TABS (ALPRAZOLAM) 1/4 tab every 6 hours as needed  #45 x 0   Entered and Authorized by:   Bobby Rumpf  MD   Signed by:   Bobby Rumpf  MD on 03/16/2010   Method used:   Print then Give to Patient   RxID:   [id number] ALPRAZOLAM 2 MG TABS (ALPRAZOLAM) 1/4 tab every 6 hours as needed  #45 x 0   Entered and Authorized by:   Bobby Rumpf  MD   Signed by:   Bobby Rumpf  MD on 03/16/2010   Method used:   Printed then faxed to ...       [facility]  [address] [id number] (retail)       [address]       [place], [place]  [address]       Ph: [phone]       Fax: [phone]   RxID:   [id number] ALPRAZOLAM 2 MG TABS (ALPRAZOLAM) 1/4 tab every 6 hours as needed  #45 x 0   Entered and Authorized by:   Bobby Rumpf  MD   Signed by:   Bobby Rumpf  MD on 03/16/2010   Method used:   Printed then faxed to ...       [facility]  [address] [id number] (retail)       [address]       [place], [place]  [address]       Ph: [phone]       Fax: [phone]   RxID:   [id number]

## 2010-09-18 NOTE — Assessment & Plan Note (Signed)
Summary: LEFT SHOULDER PAIN- fixed with OMT   Vital Signs:  Patient profile:   35 year old female Height:      64 inches Weight:      177 pounds BMI:     30.49 Temp:     98.2 degrees F oral Pulse rate:   90 / minute BP sitting:   114 / 75  (left arm) Cuff size:   regular  Vitals Entered By: Tessie Fass CMA (April 25, 2010 10:21 AM) CC: left shoulder pain x 4 days Is Patient Diabetic? No Pain Assessment Patient in pain? yes     Location: left shoulder Intensity: 2   Primary Care Neziah Vogelgesang:  Milinda Antis MD  CC:  left shoulder pain x 4 days.  History of Present Illness: Pt has had pain for  4 days left shoulder worse with certain movements, no trauma, just woke up with the pain, hurts with minimal radiation to end of shoulder and a little in neck, no loss of sensation numbness or color changing of her upper extremitiy.   Has had this on the right side before went away after about a week, could not take muscle relaxant due to making her feel too sleepy. Denies fever, chills, nausea, vomiting, diarrhea or constipation sob or chest pain.   Habits & Providers  Alcohol-Tobacco-Diet     Tobacco Status: quit  Current Medications (verified): 1)  Alprazolam 2 Mg Tabs (Alprazolam) .... 1/4 Tab Every 6 Hours As Needed 2)  Pepcid 20 Mg Tabs (Famotidine) .Marland Kitchen.. 1 By Mouth Daily As Needed Itching 3)  Triamcinolone Acetonide 0.1 % Crea (Triamcinolone Acetonide) .... Apply To Affected Areas On Body Two Times A Day, Do Not Apply To Face  Allergies (verified): 1)  ! Sulfa 2)  ! Tramadol Hcl (Tramadol Hcl)  Past History:  Past medical, surgical, family and social histories (including risk factors) reviewed, and no changes noted (except as noted below).  Past Medical History: Reviewed history from 01/25/2009 and no changes required. [id number], h/o exercise-induced asthma panic disorder chonic flank pain and dysuria  Past Surgical History: Reviewed history from 01/25/2009 and no  changes required. Tubal ligation - 06/28/2005 endometrial ablation 2009  Family History: Reviewed history from 01/12/2008 and no changes required. 4 younger sisters healthy, Mother- Lung CA, No breast CA one sister with fibroid mother--abnormal endometrial cells  grandmother endometrial cancer in 16s  Social History: Reviewed history from 01/25/2009 and no changes required. on and off separated from husband Riki Rusk.  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  Nonsmoker.  No illicit drug use.  Rare ETOH.  Review of Systems       see hpi  Physical Exam  General:  NAD, Vital signs noted  Mouth:  oropharynx clear, no angioedema Lungs:  Clear, normal effort Heart:  RRR no murmur Abdomen:  notable CVA tenderness and discomfort with palpation of R anterior abdomen.  Msk:  Left shoulder + hawkins, negative neers full passive ROM, good distal pulses sensation intact no rash or discoloration mild pain with spurlings to left shoulder only  OMT findings T4 RSleft T7 RS right C4 RS left   Impression & Recommendations:  Problem # 1:  NONALLOPATHIC LESION OF UPPER EXTREMITIES NEC (ICD-739.7) Pt manipulated found to have increase in ROM in all planes, pane much less, told to do heat three times a day increase ROM slowly, increase lifting slowly, motrin for pain if need more manipulation just make appointment.  Orders: [facility]- Est Level  3 ([address]) OMT 1-2 Body [facility] [phone])  Complete Medication List: 1)  Alprazolam 2 Mg Tabs (Alprazolam) .... 1/4 tab every 6 hours as needed 2)  Pepcid 20 Mg Tabs (Famotidine) .Marland Kitchen.. 1 by mouth daily as needed itching 3)  Triamcinolone Acetonide 0.1 % Crea (Triamcinolone acetonide) .... Apply to affected areas on body two times a day, do not apply to face

## 2010-09-18 NOTE — Progress Notes (Signed)
Summary: phn msg  Phone Note Call from Patient   Caller: Patient Summary of Call: Wanted to let Dr. Jeanice Lim know that she has not gotten x-ray of shoulder yet but will tomorrow.  Had family member pass away and was not able to go last week. Initial call taken by: Clydell Hakim,  January 22, 2010 10:39 AM  Follow-up for Phone Call        fwd. to Dr.[place] Follow-up by: Arlyss Repress CMA,,  January 22, 2010 10:50 AM

## 2010-09-18 NOTE — Progress Notes (Signed)
  Phone Note Call from Patient Call back at [phone]   Caller: Patient Summary of Call: Left shoulder/arm pain.  Awaken this morning and it was difficult to lift. Initial call taken by: Britta Mccreedy mcgregor  Follow-up for Phone Call        appt at 2:30 with Dr. Hulen Luster as she had a cancellation at that time Follow-up by: Golden Circle RN,  May 08, 2010 11:44 AM

## 2010-09-18 NOTE — Progress Notes (Signed)
Summary: Rx Re  Phone Note Refill Request Call back at [phone] Message from:  Patient  Refills Requested: Medication #1:  ALPRAZOLAM 2 MG TABS 1/4 tab every 6 hours as needed. Initial call taken by: Clydell Hakim,  Jan 11, 2010 2:01 PM    Prescriptions: ALPRAZOLAM 2 MG TABS (ALPRAZOLAM) 1/4 tab every 6 hours as needed  #45 x 1   Entered and Authorized by:   Milinda Antis MD   Signed by:   Milinda Antis MD on 01/12/2010   Method used:   Handwritten   RxID:   [id number]

## 2010-09-18 NOTE — Assessment & Plan Note (Signed)
Summary: shoulder pain/[place]/[facility]   Vital Signs:  Patient profile:   35 year old female Height:      64 inches Weight:      173 pounds BMI:     29.80 BSA:     1.84 Temp:     98.8 degrees F Pulse rate:   100 / minute BP sitting:   132 / 85  Vitals Entered By: Jone Baseman CMA (May 08, 2010 2:47 PM) CC: cont shoulder pain Is Patient Diabetic? No Pain Assessment Patient in pain? yes     Location: left arm Intensity: 5   Primary Care Provider:  Milinda Antis MD  CC:  cont shoulder pain.  History of Present Illness: shoulder pain-on and off for one month.  had CT which was normal per pt.  no injury, no increase in activity.  pt has been seen mult times for this complaint.  flexiril helps though pt unable to use at [facility].  taking motrin 600mg  three times a day with some slight stomach irritation.    pain is worse today.  woke up stiff and hurts to move it much.  no new injury no change in symptoms  Habits & Providers  Alcohol-Tobacco-Diet     Tobacco Status: quit     Cigarette Packs/Day: 1/2     Passive Smoke Exposure: yes  Current Medications (verified): 1)  Alprazolam 2 Mg Tabs (Alprazolam) .... 1/4 Tab Every 6 Hours As Needed 2)  Pepcid 20 Mg Tabs (Famotidine) .Marland Kitchen.. 1 By Mouth Daily As Needed Itching 3)  Triamcinolone Acetonide 0.1 % Crea (Triamcinolone Acetonide) .... Apply To Affected Areas On Body Two Times A Day, Do Not Apply To Face 4)  Medrol 4 Mg Tabs (Methylprednisolone) .... Medrol Dose Pack.  Per Package Instructions 5)  Flexeril 5 Mg Tabs (Cyclobenzaprine Hcl) .... Up To 3 Times A Day As Needed For Spasm  Allergies (verified): 1)  ! Sulfa 2)  ! Tramadol Hcl (Tramadol Hcl)  Review of Systems  The patient denies anorexia, fever, chest pain, and headaches.    Physical Exam  General:  VS reviewed alert, well-developed, and well-nourished.  uncomfortable appearing.   Shoulder/Elbow Exam  General:    Well-developed, well-nourished, normal body  habitus; no deformities, normal grooming.    Skin:    Intact, no scars, lesions, rashes, cafe au lait spots or bruising.    Inspection:    no deformity or swelling  Palpation:    diffusely tender over left shoulder left upper arm left side of neck  Vascular:    normal pulses  Sensory:    normal sensation in hands  Motor:    passive ROM complete, active limited by pain  Shoulder Exam:    Right:    Tenderness:  no    Left:    Stability:  stable    Tenderness:  diffuse    Swelling:  none    Erythema:  none   Impression & Recommendations:  Problem # 1:  SHOULDER PAIN, LEFT (ICD-719.41) Assessment Unchanged think this is likely muscular in origin, not due to any tears or boney issues.  shoulder joint stable.  pain diffuse.  flexiril for night time, medrol dose pack to relieve inflamation.  to PT for help mobilizing shoulder. Her updated medication list for this problem includes:    Flexeril 5 Mg Tabs (Cyclobenzaprine hcl) ..... Up to 3 times a day as needed for spasm    Ibuprofen 600 Mg Tabs (Ibuprofen) .Marland Kitchen... 1 by mouth q hrs as  needed pain  Orders: [facility]- Est Level  3 ([address]) Physical Therapy Referral (PT) [facility]- Est Level  3 ([address])  Complete Medication List: 1)  Alprazolam 2 Mg Tabs (Alprazolam) .... 1/4 tab every 6 hours as needed 2)  Pepcid 20 Mg Tabs (Famotidine) .Marland Kitchen.. 1 by mouth daily as needed itching 3)  Triamcinolone Acetonide 0.1 % Crea (Triamcinolone acetonide) .... Apply to affected areas on body two times a day, do not apply to face 4)  Medrol 4 Mg Tabs (Methylprednisolone) .... Medrol dose pack.  per package instructions 5)  Flexeril 5 Mg Tabs (Cyclobenzaprine hcl) .... Up to 3 times a day as needed for spasm 6)  Ibuprofen 600 Mg Tabs (Ibuprofen) .Marland Kitchen.. 1 by mouth q hrs as needed pain  Patient Instructions: 1)  It was nice to meet you . 2)  I hope we can help your pain a little 3)  We will call with the physical therapy information 4)  take the steroid taper  to reduce inflammation.  Do not take ibuprofen or aleve while you are taking that.  5)  You can take zantac while your stomach is bothering you 6)  come back  and see Dr. Jeanice Lim in 2-3 weeks to see how things are going Prescriptions: PEPCID 20 MG TABS (FAMOTIDINE) 1 by mouth daily as needed itching  #30 x 2   Entered and Authorized by:   Ellery Plunk MD   Signed by:   Ellery Plunk MD on 05/08/2010   Method used:   [facility]        [facility] [facility] [address] [phone][facility] (retail)       [address]       [place], [place]  [address]       Ph: [phone]       Fax: [phone]   RxID:   [id number] FLEXERIL 5 MG TABS (CYCLOBENZAPRINE HCL) up to 3 times a day as needed for spasm  #21 x 0   Entered and Authorized by:   Ellery Plunk MD   Signed by:   Ellery Plunk MD on 05/08/2010   Method used:   [facility]        [facility] [phone]* (retail)       [address]       [place], [place]  [address]       Ph: [phone]       Fax: [phone]   RxID:   [id number] MEDROL 4 MG TABS (METHYLPREDNISOLONE) medrol dose pack.  per package instructions  #1 x 0   Entered and Authorized by:   Ellery Plunk MD   Signed by:   Ellery Plunk MD on 05/08/2010   Method used:   [facility]        [facility] [phone]* (retail)       [address]       [place], [place]  [address]       Ph: [phone]       Fax: [phone]   RxID:   [id number]

## 2010-09-18 NOTE — Assessment & Plan Note (Signed)
Summary: rash over whole body/[place]   Vital Signs:  Patient profile:   35 year old [place] Height:      64 inches Weight:      175 pounds BMI:     30.15 Temp:     98.6 degrees F oral Pulse rate:   95 / minute BP sitting:   119 / Sandra  Vitals Entered By: Tessie Fass CMA (February 16, 2010 9:14 AM) CC: body rash and itching x 1 day Pain Assessment Patient in pain? no        Primary Care Provider:  Milinda Antis MD  CC:  body rash and itching x 1 day.  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 Started on legs, then spread to chest back, face, and scalp. No known sick contacts, no new lotions or soaps or perfumes used. Noted she pulled a tick off 1 week ago, but has not felt sick or had fever. Using benadryl cream for itching but unable to put everywhere. Also still concernd about rash on bilat foot, through to be fungal tried OTC medication but it did not help  Current Medications (verified): 1)  Alprazolam 2 Mg Tabs (Alprazolam) .... 1/4 Tab Every 6 Hours As Needed 2)  Flexeril 5 Mg Tabs (Cyclobenzaprine Hcl) .Marland Kitchen.. 1 By Mouth Three Times A Day As Needed Spasm 3)  Vicodin 5-500 Mg Tabs (Hydrocodone-Acetaminophen) .Marland Kitchen.. 1 By Mouth Two Times A Day X 2 Weeks 4)  Prednisone 20 Mg Tabs (Prednisone) .... Take 2 Tabs By Mouth Daily X 3 Days, Then 1 Tab By Mouth Daily X2 Days, Then 1/2 Tab Daily X 2 Days Then Stop Qs 5)  Pepcid 20 Mg Tabs (Famotidine) .Marland Kitchen.. 1 By Mouth Daily As Needed Itching 6)  Triamcinolone Acetonide 0.1 % Crea (Triamcinolone Acetonide) .... Apply To Affected Areas On Body Two Times A Day, Do Not Apply To Face  Allergies (verified): 1)  ! Sulfa 2)  ! Tramadol Hcl (Tramadol Hcl)  Physical Exam  General:  NAD, Vital signs noted  Eyes:  non injected Mouth:  oropharynx clear, no angioedema Skin:  2 annular lesions with central clearing and raised boarders on her feet  Diffuse  erythematous macular papular lesions over scalp, neck, trunk, and extremetites Palms spared  Cervical Nodes:  no axillary or cervical nodes   Impression & Recommendations:  Problem # 1:  SKIN RASH (ICD-782.1) Assessment New   Pattern more consistent with an allergic reaction or dermatitis, does not fit with viral examthem based on history but clinically possiblilty, no evidence of scabies and pt has scalp involvement. Treat symptoms with Prednisone, H1 and H2 blockers. Neg KOH for rash on foot, trial of topical steroids Her updated medication list for this problem includes:    Triamcinolone Acetonide 0.1 % Crea (Triamcinolone acetonide) .Marland Kitchen... Apply to affected areas on body two times a day, do not apply to face  Orders: [facility]- Est Level  3 ([address])  Complete Medication List: 1)  Alprazolam 2 Mg Tabs (Alprazolam) .... 1/4 tab every 6 hours as needed 2)  Flexeril 5 Mg Tabs (Cyclobenzaprine hcl) .Marland Kitchen.. 1 by mouth three times a day as needed spasm 3)  Vicodin 5-500 Mg Tabs (Hydrocodone-acetaminophen) .Marland Kitchen.. 1 by mouth two times a day x 2 weeks 4)  Prednisone 20 Mg Tabs (Prednisone) .... Take 2 tabs by mouth daily x 3 days, then 1 tab by  mouth daily x2 days, then 1/2 tab daily x 2 days then stop qs 5)  Pepcid 20 Mg Tabs (Famotidine) .Marland Kitchen.. 1 by mouth daily as needed itching 6)  Triamcinolone Acetonide 0.1 % Crea (Triamcinolone acetonide) .... Apply to affected areas on body two times a day, do not apply to face  Other Orders: [facility] ([address])  Patient Instructions: 1)  For your rash, take the steroid taper as directed 2)  For itching use your Allegra and the Pepcid 3)  I have sent a Steroid cream for the big spots on your legs,do not apply to your face 4)  Please seek care if you have any difficulty breathing or the rash does not respond to the medications I have sent 5)  I will let you know about the spots on your feet Prescriptions: TRIAMCINOLONE ACETONIDE 0.1 % CREA (TRIAMCINOLONE ACETONIDE)  apply to affected areas on body two times a day, do not apply to face  #1 x 0   Entered and Authorized by:   Milinda Antis MD   Signed by:   Milinda Antis MD on 02/16/2010   Method used:   [facility]        [facility] [address] [id number]       [address]       [place], [place]  [address]       Ph: [phone]       Fax: [phone]   RxID:   [id number] PEPCID 20 MG TABS (FAMOTIDINE) 1 by mouth daily as needed itching  #30 x 0   Entered and Authorized by:   Milinda Antis MD   Signed by:   Milinda Antis MD on 02/16/2010   Method used:   [facility]ronically to        [facility] [address] [id number]       [address]       [place], [place]  [address]       Ph: [phone]       Fax: [phone]   RxID:   [id number] PREDNISONE 20 MG TABS (PREDNISONE) Take 2 tabs by mouth daily x 3 days, then 1 tab by mouth daily x2 days, then 1/2 tab daily x 2 days then stop QS  #9 x 0   Entered and Authorized by:   Milinda Antis MD   Signed by:   Milinda Antis MD on 02/16/2010   Method used:   [facility]        [facility] [address] [id number]       [address]       [place], [place]  [address]       Ph: [phone]       Fax: [phone]   RxID:   [id number]  Left phone message, with results of skin test. Milinda Antis MD  February 16, 2010 12:36 PM    NO BLOOD TEST DONE Laboratory Results   Blood Tests   Date/Time Received:  Date/Time Reported:    Date/Time Received: February 16, 2010 9:15 AM   Date/Time Reported: February 16, 2010 12:39 PM   Other Tests  Skin KOH: Negative Comments: ...............test performed by......Marland KitchenBonnie A. Swaz[place], MLS (ASCP)cm

## 2010-09-18 NOTE — Assessment & Plan Note (Signed)
Summary: tb test,tcb  Nurse Visit   Allergies: No Known Drug Allergies  Immunizations Administered:  PPD Skin Test:    Vaccine Type: PPD    Site: right forearm    Mfr: Sanofi Pasteur    Dose: 0.1 ml    Route: ID    Given by: Theresia Lo RN    Exp. Date: 06/01/2011    Lot #: [id number]  Orders Added: 1)  TB Skin Test [86580] 2)  Admin 1st Vaccine [phone]

## 2010-09-18 NOTE — Progress Notes (Signed)
Summary: Rx  Phone Note Refill Request Call back at [phone]   Refills Requested: Medication #1:  ALPRAZOLAM 2 MG TABS 1/4 tab every 6 hours as needed Will pick up when ready  Initial call taken by: Abundio Miu,  July 19, 2010 9:49 AM  Follow-up for Phone Call        will forward to MD. Follow-up by: Theresia Lo RN,  July 20, 2010 3:29 PM  Additional Follow-up for Phone Call Additional follow up Details #1::        wants med sent in today. she called about it yesterday . she cannot go thru weekend without her med. will send to Dr. Deirdre Priest. Additional Follow-up by: Theresia Lo RN,  July 20, 2010 3:48 PM    Additional Follow-up for Phone Call Additional follow up Details #2::    Pt has a number of NoShows and has not seen Dr Jeanice Lim for her MH problems in a long time.  Would give her 10 with no refills needs to be seen before can get any more Follow-up by: Pearlean Brownie MD,  July 20, 2010 4:26 PM  Prescriptions: ALPRAZOLAM 2 MG TABS (ALPRAZOLAM) 1/4 tab every 6 hours as needed  #10 x 0   Entered and Authorized by:   Pearlean Brownie MD   Signed by:   Pearlean Brownie MD on 07/20/2010   Method used:   Telephoned to ...       [facility] [address] [id number]       [address]       [place], [place]  [address]       Ph: [phone]       Fax: [phone]   RxID:   [id number]  above message given to patient. Rx called [facility]. Theresia Lo RN  July 20, 2010 4:34 PM

## 2010-09-18 NOTE — Letter (Signed)
Summary: Out of Work  [facility]  [address]   [place], [place] [address]   Phone: [phone]  Fax: [phone]    February 16, 2010   Employee:  Raiyah R Trabucco    To Whom It May Concern:   For Medical reasons, please excuse the above named employee from work for the following dates:  Start:   July 1st, 2011, she may return to work on Monday February 19, 2010.   In short Ms. Crandell has a generalized rash more consistent with allergic or contact dermatitis. This is not a contagious rash. She will start appropriate therapy as directed.  If you need additional information, please feel free to contact our office.         Sincerely,    Milinda Antis MD

## 2010-09-19 ENCOUNTER — Encounter: Payer: Self-pay | Admitting: *Deleted

## 2010-09-20 NOTE — Assessment & Plan Note (Signed)
Summary: Shoulder pain   Vital Signs:  Patient profile:   35 year old female Height:      64 inches Weight:      179 pounds BMI:     30.84 Temp:     98.7 degrees F oral Pulse rate:   92 / minute BP sitting:   125 / 80  (right arm) Cuff size:   regular  Vitals Entered By: Tessie Fass CMA (August 24, 2010 1:53 PM) CC: left shoulder pain Pain Assessment Patient in pain? yes     Location: left shoulder Intensity: 4   Primary Care Provider:  Milinda Antis MD  CC:  left shoulder pain.  History of Present Illness:    Abd pain improved  Shoulder pain-- continued shoulder pain, now constant for 1 month, arm gets tired with washing her hair, ,no recent heavy lifitng, pain radiates from neck to back with any movements above the head , +pins and needles in forearm and fingers, occ numbness in fingertips  no change in skin color  does not drop items Left handed No clunking, but occ popping sound with movement  Current Medications (verified): 1)  Alprazolam 2 Mg Tabs (Alprazolam) .... 1/4 Tab Every 6 Hours As Needed 2)  Pepcid 20 Mg Tabs (Famotidine) .Marland Kitchen.. 1 By Mouth Daily As Needed Itching 3)  Vicodin 5-500 Mg Tabs (Hydrocodone-Acetaminophen) .Marland Kitchen.. 1 Tab By Mouth Two Times A Day As Needed Pain  Allergies (verified): 1)  ! Sulfa 2)  ! Tramadol Hcl (Tramadol Hcl)  Physical Exam  General:  Vs noted.  Well woman in NAD Neck:  normal ROM Neg spurlings,  discomoft with rotation to left side Msk:  Left shoulder + hawkins,  negative neers neg sulcus neg cross arm scratch test equal bilat  full passive ROM  +empty can on left side Strength 4/5 on left UE, 5/5 RUE normal grasp bilat no wing scalpula no muscle atrophy abduction, adduction equal bilat  Pulses:  radial pulses 2+ Extremities:  no swelling in upper ext Neurologic:  Sensation in tact in upper ext reflexes equal bilat upper ext normal tone   Impression & Recommendations:  Problem # 1:  ROTATOR CUFF  SYNDROME, LEFT (ICD-726.10) Assessment Deteriorated Persistant pain despite, OMT and consertive measures, some positive findings on rotator cuff exam, exam not suggestive of impingement, Will send for MRI and send to [facility] for evaluation Continue NSAIDS SHort term vicodin given, allergy to Ultram No heavy lifting Orders: MRI without Contrast (MRI w/o Contrast) [facility]- Est Level  3 ([address])  Complete Medication List: 1)  Alprazolam 2 Mg Tabs (Alprazolam) .... 1/4 tab every 6 hours as needed 2)  Pepcid 20 Mg Tabs (Famotidine) .Marland Kitchen.. 1 by mouth daily as needed itching 3)  Vicodin 5-500 Mg Tabs (Hydrocodone-acetaminophen) .Marland Kitchen.. 1 tab by mouth two times a day as needed pain  Patient Instructions: 1)  Please get your MRI 2)  Schedule a visit with Sports Medicine regarding your shoulder pain 3)  Take the vicodin as needed 4)  Work on Range of motion to keep it from getting stiff 5)  You can take Iburpofen with food 6)  Do not take any extra tyelnol with the vicodin  Prescriptions: VICODIN 5-500 MG TABS (HYDROCODONE-ACETAMINOPHEN) 1 tab by mouth two times a day as needed pain  #40 x 0   Entered and Authorized by:   Milinda Antis MD   Signed by:   Milinda Antis MD on 08/26/2010   Method used:   Rosezetta Schlatter  RxID:   [id number]    Orders Added: 1)  MRI without Contrast [MRI w/o Contrast] 2)  [facility]- Est Level  3 [[address]]

## 2010-09-20 NOTE — Progress Notes (Signed)
  Phone Note Outgoing Call   Call placed by: Milinda Antis MD,  September 10, 2010 1:58 PM Details for Reason: f/u MRI Summary of Call: MRI shows tendonapathy in left rotator cuff and subacromial bursitis. Pt to schedule an appt with SM for likley injection into shoulder, no surgerical intervention needed based on MRI.  Currently on NSAIDS and vicodin as needed   New Problems: SUBACROMIAL BURSITIS, LEFT (ICD-726.19)   New Problems: SUBACROMIAL BURSITIS, LEFT (ICD-726.19)

## 2010-09-20 NOTE — Assessment & Plan Note (Signed)
Summary: fever/back pain/eo   Vital Signs:  Patient profile:   35 year old female Height:      64 inches Weight:      178 pounds BMI:     30.66 Temp:     98.6 degrees F oral Pulse rate:   91 / minute BP sitting:   122 / 85  (right arm) Cuff size:   regular  Vitals Entered By: Tessie Fass CMA (August 15, 2010 10:36 AM) CC: back pain, headache Pain Assessment Patient in pain? yes     Location: lower back, head Intensity: 3   Primary Care Provider:  Milinda Antis MD  CC:  back pain and headache.  History of Present Illness: 1) Back pain: For 3 days associated with urinary frequency and foul smelling urine.  Has had subjective fever and chills with this and cold discussed below.  Notes abdominal pain and denies any constipation. Mild non blood diarrhea.  Has had UTIs in the past and has had STIs in the past.   2) URI: For 2 days associated with cough, headache, and a sick contact. Denies dyspnea.  MIld subjective fever and chills yesterday with cold symptoms. Ibuprofen helps some.    Current Problems (verified): 1)  Screening For Malignant Neoplasm of The Cervix  (ICD-V76.2) 2)  Back Pain, Lumbar  (ICD-724.2) 3)  Dysuria  (ICD-788.1) 4)  Viral Uri  (ICD-465.9) 5)  Bipolar Disorder Unspecified  (ICD-296.80) 6)  Panic Disorder  (ICD-300.01) 7)  Rhinitis, Allergic  (ICD-477.9)  Current Medications (verified): 1)  Alprazolam 2 Mg Tabs (Alprazolam) .... 1/4 Tab Every 6 Hours As Needed 2)  Pepcid 20 Mg Tabs (Famotidine) .Marland Kitchen.. 1 By Mouth Daily As Needed Itching 3)  Ibuprofen 600 Mg Tabs (Ibuprofen) .Marland Kitchen.. 1 By Mouth Q Hrs As Needed Pain  Allergies (verified): 1)  ! Sulfa 2)  ! Tramadol Hcl (Tramadol Hcl)  Past History:  Past Medical History: Last updated: 08/03/2010 [id number], h/o exercise-induced asthma panic disorder/anxiety chonic flank pain and dysuria Sprain RIght MCL Bipolar  Past Surgical History: Last updated: 01/25/2009 Tubal ligation -  06/28/2005 endometrial ablation 2009  Family History: Last updated: 01/12/2008 4 younger sisters healthy, Mother- Lung CA, No breast CA one sister with fibroid mother--abnormal endometrial cells  grandmother endometrial cancer in 24s  Social History: Last updated: 08/03/2010 on and off separated from husband Riki Rusk.  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  Nonsmoker.  No illicit drug use.  Rare ETOH.  Risk Factors: Smoking Status: quit > 6 months (08/03/2010) Packs/Day: 1/2 (05/08/2010) Passive Smoke Exposure: yes (05/08/2010)  Review of Systems       The patient complains of fever, headaches, and abdominal pain.  The patient denies anorexia, weight loss, chest pain, syncope, dyspnea on exertion, hemoptysis, severe indigestion/heartburn, muscle weakness, and enlarged lymph nodes.    Physical Exam  General:  Vs noted.  Well woman in NAD Head:  TTP over nasal bridge,maxillary sinus region Eyes:  conjunctiva Clear Nose:  pale mucosa in nose, nasal discharge, no bleeding Mouth:  pharynx pink and moist.  no exudates no foul odor to breath Lungs:  CTABL Heart:  RRR no MRG Abdomen:  notable CVA tenderness and discomfort with palpation of R anterior abdomen. NABS no masses noted Genitalia:  Normal introitus for age, no external lesions, no vaginal discharge, mucosa pink and moist, no vaginal or cervical lesions, no vaginal atrophy, no friaility or hemorrhage, normal uterus size and position. Moderate discomfort with bimanual exam and with  abdominal palpation.  Extremities:  No edema Cervical Nodes:  No lymphadenopathy noted Axillary Nodes:  No palpable lymphadenopathy   Impression & Recommendations:  Problem # 1:  DYSURIA (ICD-788.1) Assessment New UA is normal. Think symptoms may be related to STI. Dyscomfort greater than expected for bimanual exam.  Wet prep shows BV. I do not think this a large component of her pain but I will treat anyway.  GC obtained.  Will  treat emperically for GC/CHL with azithromycin and ceftriaxone. Will follow up with PCP in 1 week.  Orders: Urinalysis-[facility] (00000) GC[facility]lam[facility] (87591/87491) [facility] Azithromycin oral (Q0144) [facility]- Est  Level 4 (60454)  Her updated medication list for this problem includes:    Metronidazole 500 Mg Tabs (Metronidazole) .Marland Kitchen... 1 by mouth two times a day x 7 day  Problem # 2:  VIRAL URI (ICD-465.9) Assessment: New  Think cold sympotms are related to a cold. Discussed supportive care and management. Pt will follow up as above. Red flags reviewed.  Her updated medication list for this problem includes:    Ibuprofen 600 Mg Tabs (Ibuprofen) .Marland Kitchen... 1 by mouth q hrs as needed pain  Orders: Rocephin  250mg  (U9811) [facility]- Est  Level 4 (99214)  Problem # 3:  SCREENING FOR MALIGNANT NEOPLASM OF THE CERVIX (ICD-V76.2) Is due for a pap smear this week. Will go ahead and obtain the sample during the pelvic exam today. Pt agreeable to the plan.  Orders: Pap Smear-[facility] ([id number])  Complete Medication List: 1)  Alprazolam 2 Mg Tabs (Alprazolam) .... 1/4 tab every 6 hours as needed 2)  Pepcid 20 Mg Tabs (Famotidine) .Marland Kitchen.. 1 by mouth daily as needed itching 3)  Ibuprofen 600 Mg Tabs (Ibuprofen) .Marland Kitchen.. 1 by mouth q hrs as needed pain 4)  Metronidazole 500 Mg Tabs (Metronidazole) .Marland Kitchen.. 1 by mouth two times a day x 7 day Prescriptions: METRONIDAZOLE 500 MG TABS (METRONIDAZOLE) 1 by mouth two times a day x 7 day  #14 x 0   Entered and Authorized by:   Clementeen Liliann File MD   Signed by:   Clementeen Marlin Brys MD on 08/15/2010   Method used:   Electronically to        [facility] [address] [phone]* (retail)       [address]       [place], [place]  [address]       Ph: [phone] or [phone]       Fax: [phone]   RxID:   [id number]    Medication Administration  Injection # 1:    Medication: Rocephin  250mg     Diagnosis: DYSURIA (ICD-788.1)    Route: IM    Site:  RUOQ gluteus    Exp Date: 12/17/2012    Lot #: [id number]    Mfr: [email or web address]    Comments: 250mg  given    Patient tolerated injection without complications    Given by: Tessie Fass CMA (August 15, 2010 11:31 AM)  Medication # 1:    Medication: Azithromycin oral    Diagnosis: DYSURIA (ICD-788.1)    Dose: 1 gram    Route: po    Exp Date: 11/18/2011    Lot #: [id number]    Mfr: [email or web address]    Comments: 1 gram given    Patient tolerated medication without complications    Given by: Tessie Fass CMA (August 15, 2010 11:33 AM)  Orders Added: 1)  Urinalysis-[facility] [00000] 2)  GC/Chlamydia-[facility] [87591/87491] 3)  Wet Prep- [facility] [62130] 4)  Pap Smear-[facility] [[id number]] 5)  Rocephin  250mg  [J0696] 6)  Azithromycin oral [Q0144] 7)  [facility]- Est  Level 4 [95284]    Laboratory Results   Urine Tests  Date/Time Received: August 15, 2010 10:43 AM  Date/Time Reported: August 15, 2010 10:56 AM   Routine Urinalysis   Color: light yellow Appearance: Clear Glucose: negative   (Normal Range: Negative) Bilirubin: negative   (Normal Range: Negative) Ketone: negative   (Normal Range: Negative) Spec. Gravity: 1.025   (Normal Range: 1.003-1.035) Blood: negative   (Normal Range: Negative) pH: 7.5   (Normal Range: 5.0-8.0) Protein: negative   (Normal Range: Negative) Urobilinogen: 0.2   (Normal Range: 0-1) Nitrite: negative   (Normal Range: Negative) Leukocyte Esterace: negative   (Normal Range: Negative)    Comments: .........Marland Kitchenbiochemical negative; microscopic not indicated ...............test performed by......Marland KitchenBonnie A. Swazil[place], MLS (ASCP)cm  Date/Time Received: August 15, 2010 11:10 AM  Date/Time Reported: August 15, 2010 11:20 AM   [place] Source: vaginal WBC/hpf: rare Bacteria/hpf: 3+  Cocci Clue cells/hpf: moderate  Positive whiff Yeast/hpf: none Trichomonas/hpf: none Comments: ...........test performed by...........Marland KitchenTerese Door, CMA

## 2010-09-20 NOTE — Assessment & Plan Note (Signed)
Summary: anxiety, sinus   Vital Signs:  Patient profile:   35 year old female Height:      64 inches Weight:      180 pounds Temp:     98.2 degrees F oral Pulse rate:   92 / minute BP sitting:   128 / 78  (right arm)  Vitals Entered By: Arlyss Repress CMA, (August 03, 2010 9:43 AM) CC: f/up anxiety. refill meds. sinus pressure and head ache x 2 days. Is Patient Diabetic? No Pain Assessment Patient in pain? no        Primary Care Provider:  Milinda Antis MD  CC:  f/up anxiety. refill meds. sinus pressure and head ache x 2 days.Marland Kitchen  History of Present Illness:   Anxiety- long standing history of anxiety and panic attacks. She was also diagnosed with Bipolar in 2010. Previously mainianted on xanax and paxil, with diagnosis of Bipolar pt was having more manic symptoms therefore this was discontinued and pt placed on Depakote. Mood was stable and anxiety/panic attacks also improved with less use of xanax however has not been able to afford the depakote after patient loss her Medicaid. Pt was treated for depression after her grandparents died when she was a teen also had suicidal ideation at that time. Her mother died 6 years ago, this is when she had her first panic attack, while working, panic attacks usually start as heaviness in jaw and progress with palipiations,tingling in ext,  difficulty breathing feeling of doom or starts as chest pain.  If she has the initial ones in her jaw she can often talk herself down with breathing techinques but sometimes she can not Often feels like her somatic symptoms will cause death or severe problems, such as when she has neck pain she feels like she is having a stroke Uses xanax 1/4 tab twice a day, every day but would like to not have to rely on this all the time  Currently sepeared from husband, splitting custody of her children, she is the only income between the two. 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 1/4 Tab Every 6 Hours As Needed 2)  Pepcid 20 Mg Tabs (Famotidine) .Marland Kitchen.. 1 By Mouth Daily As Needed Itching 3)  Ibuprofen 600 Mg Tabs (Ibuprofen) .Marland Kitchen.. 1 By Mouth Q Hrs As Needed Pain  Allergies (verified): 1)  ! Sulfa 2)  ! Tramadol Hcl (Tramadol Hcl)  Past History:  Past Medical History: [id number], h/o exercise-induced asthma panic disorder/anxiety chonic flank pain and dysuria Sprain RIght MCL Bipolar  Social History: on and off separated from husband Riki Rusk.  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  Nonsmoker.  No illicit drug use.  Rare ETOH.Smoking Status:  quit > 6 months  Review of Systems       Per HPI  Physical Exam  General:  Well-developed, well-nourished, normal body habitus; no deformities, normal grooming.   Vital signs noted  Head:  TTP over nasal bridge,maxillary sinus region Eyes:  conjunctiva Clear Ears:  TM clear bialt, canals clear,  no fluid noted Nose:  pale mucosa in nose, nasal discharge, no bleeding Mouth:  pharynx pink and moist.  no exudates no foul odor to breath Neck:  no LAD Lungs:  CTAB Heart:  RRR   Impression & Recommendations:  Problem # 1:  PANIC DISORDER (ICD-300.01) Assessment Unchanged  Continue Xanax at this time Her updated medication list for this problem includes:    Alprazolam 2 Mg Tabs (Alprazolam) .Marland Kitchen... 1/4 tab every 6 hours as needed  Orders: [facility]- Est  Level 4 (05397)  Problem # 2:  BIPOLAR DISORDER UNSPECIFIED (ICD-296.80) Assessment: Unchanged  Pt no longer follows with [facility]. Willing to seek help with Mental  Health, this may also help her with medication availablility No SI At this point would not want to start SSRI which may deteriorte mental condition with Bipolar therefore will hold, I am not comofortable starting agents such as lithium for this  Orders: [facility]- Est  Level 4 (67341)  Problem # 3:  VIRAL URI (ICD-465.9) Assessment: New  Supportive care, time frame and symptoms not suggestive of bacterial infection at this time see instructions Her updated medication list for this problem includes:    Ibuprofen 600 Mg Tabs (Ibuprofen) .Marland Kitchen... 1 by mouth q hrs as needed pain  Orders: [facility]- Est  Level 4 (93790)  Complete Medication List: 1)  Alprazolam 2 Mg Tabs (Alprazolam) .... 1/4 tab every 6 hours as needed 2)  Pepcid 20 Mg Tabs (Famotidine) .Marland Kitchen.. 1 by mouth daily as needed itching 3)  Ibuprofen 600 Mg Tabs (Ibuprofen) .Marland Kitchen.. 1 by mouth q hrs as needed pain  Other Orders: Influenza Vaccine NON MCR (24097)  Patient Instructions: 1)  Nasal saline or netty pot 2)  Take an anti-histamine as needed 3)  Continue the xanax as directed 4)  You could consider mental health as well, they can also help with medications Handout provided Prescriptions: ALPRAZOLAM 2 MG TABS (ALPRAZOLAM) 1/4 tab every 6 hours as needed  #45 x 0   Entered and Authorized by:   Milinda Antis MD   Signed by:   Milinda Antis MD on 08/03/2010   Method used:   Handwritten   RxID:   [id number]    Orders Added: 1)  Influenza Vaccine NON MCR [00028] 2)  [facility]- Est  Level 4 [22297]   Immunizations Administered:  Influenza Vaccine # 1:    Vaccine Type: Fluvax Non-MCR    Site: right deltoid    Mfr: GlaxoSmithKline    Dose: 0.5 ml    Route: IM    Given by: Loralee Pacas CMA    Exp. Date: 02/16/2011    Lot #: [id number]    VIS given: 03/13/10 version given August 03, 2010.  Flu Vaccine Consent Questions:    Do you have a history of severe allergic reactions to this vaccine? no    Any prior history of  allergic reactions to egg and/or gelatin? no    Do you have a sensitivity to the preservative Thimersol? no    Do you have a past history of Guillan-Barre Syndrome? no    Do you currently have an acute febrile illness? no    Have you ever had a severe reaction to latex? no    Vaccine information given and explained to patient? yes    Are you currently pregnant? no   Immunizations Administered:  Influenza Vaccine # 1:    Vaccine Type: Fluvax Non-MCR    Site: right deltoid    Mfr: GlaxoSmithKline  Dose: 0.5 ml    Route: IM    Given by: Loralee Pacas CMA    Exp. Date: 02/16/2011    Lot #: [id number]    VIS given: 03/13/10 version given August 03, 2010.

## 2010-09-20 NOTE — Progress Notes (Signed)
Summary: Pt needs appt  Phone Note Call from Patient Call back at [phone]   Reason for Call: Talk to Doctor Summary of Call: pt told she needed to see MD before she can get more meds, 1st avail is 12/30 unless we double book. please advise, pt will be out of meds way before 12/30.  Initial call taken by: Knox Royalty,  July 27, 2010 8:40 AM  Follow-up for Phone Call        My schedule is open for the next 3 weeks, I do not have a full clinic on any days as of today. She needs to schedule within the next few weeks. Follow-up by: Milinda Antis MD,  July 27, 2010 1:04 PM  Additional Follow-up for Phone Call Additional follow up Details #1::        pt scheduled on 12/16. Additional Follow-up by: Knox Royalty,  August 01, 2010 8:55 AM

## 2010-09-20 NOTE — Miscellaneous (Signed)
Summary: MRI orders  Clinical Lists Changes  Problems: Added new problem of CERVICALGIA (ICD-723.1) Orders: Added new Test order of Radiology other (Radiology Other) - Signed Added new Test order of Radiology other (Radiology Other) - Signed

## 2010-09-20 NOTE — Miscellaneous (Signed)
Summary: MRI  Clinical Lists Changes Spoke with pt, was waiting for her to apply to [facility] before scheduling MRI. Says she does not qualify but did apply to [facility] again. Thinks she still does not qualify for medicaid either and wants to schedule MRI and work out payment plan.Molly Maduro [facility] CMA  August 31, 2010 11:00 AM  scheduled appt at [facility] 09/10/10 @ 11am. pt notified of appt.Marland KitchenMarland KitchenTessie Fass CMA  September 03, 2010 12:11 PM

## 2010-09-25 ENCOUNTER — Other Ambulatory Visit (INDEPENDENT_AMBULATORY_CARE_PROVIDER_SITE_OTHER): Payer: Self-pay | Admitting: Family Medicine

## 2010-09-25 ENCOUNTER — Encounter: Payer: Self-pay | Admitting: Family Medicine

## 2010-09-25 DIAGNOSIS — M542 Cervicalgia: Secondary | ICD-10-CM

## 2010-09-25 DIAGNOSIS — M75100 Unspecified rotator cuff tear or rupture of unspecified shoulder, not specified as traumatic: Secondary | ICD-10-CM

## 2010-09-25 DIAGNOSIS — M25519 Pain in unspecified shoulder: Secondary | ICD-10-CM

## 2010-09-25 DIAGNOSIS — M719 Bursopathy, unspecified: Secondary | ICD-10-CM

## 2010-09-26 NOTE — Progress Notes (Signed)
Summary: Rx  Phone Note Refill Request Call back at Home Phone [phone]   Refills Requested: Medication #1:  VICODIN 5-500 MG TABS 1 tab by mouth two times a day as needed pain. asking for refill until pt sees ortho doc on 2/7 with Dr. Christella Hartigan.   Initial call taken by: Knox Royalty,  September 17, 2010 11:09 AM    Prescriptions: VICODIN 5-500 MG TABS (HYDROCODONE-ACETAMINOPHEN) 1 tab by mouth two times a day as needed pain  #40 x 0   Entered and Authorized by:   Milinda Antis MD   Signed by:   Milinda Antis MD on 09/17/2010   Method used:   Handwritten   RxID:   [id number]

## 2010-10-04 NOTE — Assessment & Plan Note (Signed)
Summary: u/s l subacromial bursitis,mc   Vital Signs:  Patient profile:   35 year old female BP sitting:   127 / 85  Vitals Entered By: Rochele Pages RN (September 25, 2010 1:39 PM)  Primary Provider:  Milinda Antis MD  CC:  L shoulder pain.  History of Present Illness: 34yo L-hand dominant female referred to office by Dr. Jeanice Lim for evaluation of L shoulder pain.  Pain started several months ago, awoke with pain in shoulder one morning.  Denies injury or trauma.  Pain is along anterior lateral aspect of the shoulder, no radiation to the elbow.  Has some associated L-sided neck pain, at times feels shoulder pain radiates to neck.  Pain with overhead activity & trying to wash her hair.  Has tried ibuprofen 800mg  two times a day in past with some improvement.  Currently using Vicodin 5/500 1/2-1 tab two times a day.  Is experiencing night time pain.  No numbness/tingling in upper extremity.  Recent MRI C-spine showed small central disc buldge at C5-6 level, MRI L-shoulder showed supraspinatus & infraspinatus tendinopathy and subacromial bursitis.  No hx of steroid injections.  Denies previous shoulder issues.  Allergies: 1)  ! Sulfa 2)  ! Tramadol Hcl (Tramadol Hcl)  Past History:  Past Medical History: Last updated: 08/03/2010 [id number], h/o exercise-induced asthma panic disorder/anxiety chonic flank pain and dysuria Sprain RIght MCL Bipolar  Past Surgical History: Last updated: 01/25/2009 Tubal ligation - 06/28/2005 endometrial ablation 2009  Family History: Last updated: 01/12/2008 4 younger sisters healthy, Mother- Lung CA, No breast CA one sister with fibroid mother--abnormal endometrial cells  grandmother endometrial cancer in 39s  Social History: Last updated: 08/03/2010 on and off separated from husband Riki Rusk.  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  Nonsmoker.  No illicit drug use.  Rare ETOH.  Review of Systems       per HPI, otherwise  negative  Physical Exam  General:  Well-developed,well-nourished,in no acute distress; alert,appropriate and cooperative throughout examination Head:  normocephalic and atraumatic.   Neck:  supple.   Lungs:  normal respiratory effort.   Msk:  C-SPINE: no gross deformity.  Decreased ROM in R-sidebending & R rotation by about 25% with pain.  No midline TTP, but TTP along L-paraspinal muscles into L trapezius with mild spasm.  Spurlings on L reproduces pain into lateral shoulder & upper arm, neg Spurlings on right.  SHOULDER: - L shoulder:no gross deformity.  Decreased ROM - flexion 100, abd 100, ER 60, IR L4.  TTP over AC-joint & over medial deltoid.  (+)Hawkins, (+)Neer, (+)empty can.  Neg speeds, neg Obriens.  Mild weakness with abduction & ER. - R shoulder: FROM without pain, swelling, tenderness, or weakness. Neg impingement testing. Pulses:  +2/4 radial b/l Neurologic:  sensation intact to light touch.   DTR +2/4 bicep, tricep, brachioradialis Additional Exam:  IMAGING:  Personally reviewed MRI C-spine from 09/10/10 showing small central and left paracentral protrusion at C5-6 effaces the ventral thecal sac and mildly deforms left hemicord.  Foramina appear open.  MRI L-shoulder from 09/10/10 showing supraspinatus & infraspinatus tendinopathy without tear; Mild to moderate acromioclavicular degenerative change; Subacromial/subdeltoid bursitis.  MSK U/S: L shoulder- bicep tendon with small calcification near insertion, no signs of acute tear.  Normal appearing subscap.  Calcifications in  supraspinatus, no signs of acute tear.  Normal appearing infraspinatus.  Fluid noted in subacromial bursa.  No dynamic impingement noted.  No increased doppler flow.  Images saved.  Impression & Recommendations:  Problem # 1:  SHOULDER PAIN, LEFT (ICD-719.41)  - Secondary to RTC tendinopathy & subacromial bursitis, no signs of RTC tear on MRI or MSK u/s - Underwent subacromial steroid injection in office  today: PROCEDURE NOTE: Consent obtained and verified. Area cleansed with alcohol. Topical analgesic spray: Ethyl chloride. Joint: L subacromial bursa Approached in typical fashion with: posterior approach Completed without difficulty Meds: 4cc Lidocaine 1% + 1cc Kenalog 40mg /cc Needle: 25g 1.5 inch Aftercare instructions and Red flags advised. Tolerated procedure well & felt significant improvement in shoulder pain after injection. -Start using Mobic 15mg  by mouth daily with food x 2-wks, then as needed - Given handout on shoulder ROM exercises to do daily - f/u 3-4 weeks for re-evaluation.  If minimal improvement from injection, may need to consider neck as cause of symptoms.  Her updated medication list for this problem includes:    Vicodin 5-500 Mg Tabs (Hydrocodone-acetaminophen) .Marland Kitchen... 1 tab by mouth two times a day as needed pain    Mobic 15 Mg Tabs (Meloxicam) .Marland Kitchen... 1 by mouth daily with food  Orders: Joint Aspirate / Injection, Large (20610) Kenalog 10 mg inj (Z6109)  Problem # 2:  SUBACROMIAL BURSITIS, LEFT (ICD-726.19)  - Underwent steroid injection as stated above - Start Mobic as above - ROM exercises - f/u 3-4 weeks  Orders: Joint Aspirate / Injection, Large (60454) Kenalog 10 mg inj (U9811)  Problem # 3:  CERVICALGIA (ICD-723.1)  - MRI showing C5-6 disc buldge.  If shoulder pain not improved from steroid injection, would consider radiculopathy as cause.  Will re-evaluate during follow-up  Her updated medication list for this problem includes:    Vicodin 5-500 Mg Tabs (Hydrocodone-acetaminophen) .Marland Kitchen... 1 tab by mouth two times a day as needed pain    Mobic 15 Mg Tabs (Meloxicam) .Marland Kitchen... 1 by mouth daily with food  Orders: Joint Aspirate / Injection, Large (20610) Kenalog 10 mg inj (J3301)  Problem # 4:  PANIC DISORDER (ICD-300.01) Assessment: Comment Only ***ADDENDUM*** Pt called office at 16:00 following appt with c/o of palpitations.  Has hx of Panic D/O,  but does not get panic attacks frequently.  Following steroid injection was able to drive to work without difficulty & was feeling fine.  Denies any CP, SOB, dizziness.  Symptoms very similar to her typical Panic Attacks.  No specific trigger.  Spoke directly with patient & reviewed symptoms, at this time feel related to panic and not adverse reaction to injection.  She is in a safe place & able to take her alprazolam (anxiety rescue medication), she also has somebody to drive her home from work.  Advised her to take rescue medication as directed.  If symptoms persist should contact office & may need to investigate further.  Reviewed red flag signs & when to seek immediate medical attention.  Pt expressed understanding & agreement with above.  Her updated medication list for this problem includes:    Alprazolam 2 Mg Tabs (Alprazolam) .Marland Kitchen... 1/4 tab every 6 hours as needed  Complete Medication List: 1)  Alprazolam 2 Mg Tabs (Alprazolam) .... 1/4 tab every 6 hours as needed 2)  Pepcid 20 Mg Tabs (Famotidine) .Marland Kitchen.. 1 by mouth daily as needed itching 3)  Vicodin 5-500 Mg Tabs (Hydrocodone-acetaminophen) .Marland Kitchen.. 1 tab by mouth two times a day as needed pain 4)  Mobic 15 Mg Tabs (Meloxicam) .Marland Kitchen.. 1 by mouth daily with food Prescriptions: MOBIC 15 MG TABS (MELOXICAM) 1 by mouth daily with food  #30  x 1   Entered and Authorized by:   Darene Lamer MD   Signed by:   Lillia Pauls CMA on 09/25/2010   Method used:   Print then Give to Patient   RxID:   [id number]    Orders Added: 1)  Est. Patient Level IV [14782] 2)  Joint Aspirate / Injection, Large [20610] 3)  Kenalog 10 mg inj [J3301]

## 2010-10-08 ENCOUNTER — Ambulatory Visit (INDEPENDENT_AMBULATORY_CARE_PROVIDER_SITE_OTHER): Payer: Self-pay | Admitting: Family Medicine

## 2010-10-08 ENCOUNTER — Encounter: Payer: Self-pay | Admitting: Family Medicine

## 2010-10-08 DIAGNOSIS — M25519 Pain in unspecified shoulder: Secondary | ICD-10-CM

## 2010-10-16 ENCOUNTER — Ambulatory Visit (INDEPENDENT_AMBULATORY_CARE_PROVIDER_SITE_OTHER): Payer: Self-pay | Admitting: Family Medicine

## 2010-10-16 ENCOUNTER — Encounter: Payer: Self-pay | Admitting: Family Medicine

## 2010-10-16 ENCOUNTER — Ambulatory Visit: Payer: Self-pay | Admitting: Family Medicine

## 2010-10-16 DIAGNOSIS — M25519 Pain in unspecified shoulder: Secondary | ICD-10-CM

## 2010-10-16 NOTE — Assessment & Plan Note (Signed)
Summary: SHOULD AND ARM PAIN/MJD   Vital Signs:  Patient profile:   35 year old female BP sitting:   127 / 92  Vitals Entered By: Rochele Pages RN (October 08, 2010 2:37 PM)  Primary Care Provider:  Milinda Antis MD   History of Present Illness: left shoulder pain--was somewhat improved with injection she received last visit. Now she is having posterior neck /trapezius pain taht is new. left hand dominant. has been doingthe exercises for [facility] and thinks she may have done too much.  Current Medications (verified): 1)  Alprazolam 2 Mg Tabs (Alprazolam) .... 1/4 Tab Every 6 Hours As Needed 2)  Pepcid 20 Mg Tabs (Famotidine) .Marland Kitchen.. 1 By Mouth Daily As Needed Itching 3)  Vicodin 5-500 Mg Tabs (Hydrocodone-Acetaminophen) .Marland Kitchen.. 1 Tab By Mouth Two Times A Day As Needed Pain 4)  Diclofenac Potassium 50 Mg Tabs (Diclofenac Potassium) .Marland Kitchen.. 1 By Mouth Two Times A Day or Tid 5)  Flexeril 5 Mg Tabs (Cyclobenzaprine Hcl) .... 1/2 -1 By Mouth Two Times A Day or At Bedtime As Needed Muscle Spasm  Allergies: 1)  ! Sulfa 2)  ! Tramadol Hcl (Tramadol Hcl)  Review of Systems  The patient denies fever.    Physical Exam  General:  alert, well-developed, well-nourished, and well-hydrated.   Msk:  Left shoulder FROM and inact strength in allplanes rotator cuff muscles. negative impingement signs  Left trapezius has several trigger points, a lot of spasm. LUE distally neurovascularly intact   Impression & Recommendations:  Problem # 1:  SHOULDER PAIN, LEFT (ICD-719.41) more of a trap spasm atthis time. will use flexeril, gave her handout on modified overhead press. rtc 1 w  Complete Medication List: 1)  Alprazolam 2 Mg Tabs (Alprazolam) .... 1/4 tab every 6 hours as needed 2)  Pepcid 20 Mg Tabs (Famotidine) .Marland Kitchen.. 1 by mouth daily as needed itching 3)  Vicodin 5-500 Mg Tabs (Hydrocodone-acetaminophen) .Marland Kitchen.. 1 tab by mouth two times a day as needed pain 4)  Diclofenac Potassium 50 Mg Tabs (Diclofenac  potassium) .Marland Kitchen.. 1 by mouth two times a day or tid 5)  Flexeril 5 Mg Tabs (Cyclobenzaprine hcl) .... 1/2 -1 by mouth two times a day or at bedtime as needed muscle spasm Prescriptions: FLEXERIL 5 MG TABS (CYCLOBENZAPRINE HCL) 1/2 -1 by mouth two times a day or at bedtime as needed muscle spasm  #30 x 1   Entered and Authorized by:   Denny Levy MD   Signed by:   Denny Levy MD on 10/08/2010   Method used:   Electronically to        [facility]  [address] [phone]* (retail)       [address]       [place], [place]  [address]       Ph: [phone]       Fax: [phone]   RxID:   [id number] DICLOFENAC POTASSIUM 50 MG TABS (DICLOFENAC POTASSIUM) 1 by mouth two times a day or tid  #90 x 1   Entered and Authorized by:   Denny Levy MD   Signed by:   Denny Levy MD on 10/08/2010   Method used:   Electronically to        [facility]  [address] [phone]* (retail)       [address]       [place], [place]  [address]  Ph: [phone]       Fax: [phone]   RxID:   [id number]    Orders Added: 1)  Est. Patient Level III [10272]

## 2010-10-25 NOTE — Assessment & Plan Note (Signed)
Summary: F/U L shoulder   Vital Signs:  Patient profile:   35 year old female BP sitting:   122 / 84  Vitals Entered By: Lillia Pauls CMA (October 16, 2010 2:37 PM)  Primary Provider:  Milinda Antis MD  CC:  f/u L shoulder.  History of Present Illness: 34yo L-hand dominant female to office for f/u L shoulder pain.  Subacromial injection 3-weeks ago provided 20-30% improvement, but started to have significant spasm along L trapezius.  Seen again in our office 1-wk ago for this.  Taking Flexeril 5mg  1/2-1 tab once daily-bid with some improvement, although she notes it makes her very sleepy.  Shoulder pain is slightly improved from last visit, still present along posterior-lateral shoulder.  Continues to do some shoulder exercises.  Diclofenac caused stomach irritiation & stopped medication, is able to take ibuprofen.  Also taking tylenol as needed.  Denies any numbness/tingling.  Allergies: 1)  ! Sulfa 2)  ! Tramadol Hcl (Tramadol Hcl)  Physical Exam  General:  Well-developed,well-nourished,in no acute distress; alert,appropriate and cooperative throughout examination Msk:  C-spine: FROM b/l without pain, no midline tenderness, mild L paraspinal tenderness with slight spasm.  SHOULDER: - L shoulder: TTP along trapezius with associated spasm - although improveing.  FROM with some pain with abduction & ER.  (+)empty can, neg impingement testing, neg speeds, neg Obriens.  - R shoulder: FROM without pain, swelling, tenderness, weakness, or instability Pulses:  +2/4 radial b/l Neurologic:  sensation intact to light touch.   DTR +2/4 bicep, tricep, brachioradialis   Impression & Recommendations:  Problem # 1:  SHOULDER PAIN, LEFT (ICD-719.41) - Slightly improved, still with spasm in the trapezius - cont. flexeril as she is doing, offered different, less sedating muscle relaxant but would like to cont. with flexeril - d/c diclofenac since having stomach upset.  Start ibuprofen 600mg   three times a day with food - pt has tolerated this well in the past - Heating pad at end of day - Cont. shoulder exercises - focus on shoulder circles & wall crawls, feel theraband exercises will likely aggrevate at this time - Discussed option of physical therapy, pt interested, but currently self-pay.  PT Rx given - plans to go when receives tax refund in the near future - f/u 3-weeks  The following medications were removed from the medication list:    Diclofenac Potassium 50 Mg Tabs (Diclofenac potassium) .Marland Kitchen... 1 by mouth two times a day or tid Her updated medication list for this problem includes:    Vicodin 5-500 Mg Tabs (Hydrocodone-acetaminophen) .Marland Kitchen... 1 tab by mouth two times a day as needed pain    Flexeril 5 Mg Tabs (Cyclobenzaprine hcl) .Marland Kitchen... 1/2 -1 by mouth two times a day or at bedtime as needed muscle spasm  Complete Medication List: 1)  Alprazolam 2 Mg Tabs (Alprazolam) .... 1/4 tab every 6 hours as needed 2)  Pepcid 20 Mg Tabs (Famotidine) .Marland Kitchen.. 1 by mouth daily as needed itching 3)  Vicodin 5-500 Mg Tabs (Hydrocodone-acetaminophen) .Marland Kitchen.. 1 tab by mouth two times a day as needed pain 4)  Flexeril 5 Mg Tabs (Cyclobenzaprine hcl) .... 1/2 -1 by mouth two times a day or at bedtime as needed muscle spasm   Orders Added: 1)  Est. Patient Level III [16109]

## 2010-11-04 LAB — COMPREHENSIVE METABOLIC PANEL
ALT: 14 U/L (ref 0–35)
Alkaline Phosphatase: 61 U/L (ref 39–117)
CO2: 23 mEq/L (ref 19–32)
Chloride: 109 mEq/L (ref 96–112)
GFR calc non Af Amer: >60 mL/min (ref >60)
Glucose, Bld: 97 mg/dL (ref 70–99)
Potassium: 3.6 mEq/L (ref 3.5–5.1)
Sodium: 138 mEq/L (ref 135–145)
Total Protein: 6.8 g/dL (ref 6.0–8.3)

## 2010-11-04 LAB — DIFFERENTIAL
Basophils Relative: 1 % (ref 0–1)
Eosinophils Absolute: 0.3 10*3/uL (ref 0.0–0.7)
Monocytes Relative: 7 % (ref 3–12)
Neutrophils Relative %: 65 % (ref 43–77)

## 2010-11-04 LAB — CBC
HCT: 37.5 % (ref 36.0–46.0)
Hemoglobin: 13 g/dL (ref 12.0–15.0)
WBC: 10.6 10*3/uL — ABNORMAL HIGH (ref 4.0–10.5)

## 2010-11-04 LAB — URINALYSIS, ROUTINE W REFLEX MICROSCOPIC
Hgb urine dipstick: NEGATIVE
Protein, ur: NEGATIVE mg/dL
Urobilinogen, UA: 0.2 mg/dL (ref 0.0–1.0)

## 2010-11-04 LAB — URINE MICROSCOPIC-ADD ON

## 2010-11-04 LAB — POCT PREGNANCY, URINE: Preg Test, Ur: NEGATIVE

## 2010-11-05 LAB — CSF CELL COUNT WITH DIFFERENTIAL: Tube #: 1

## 2010-11-05 LAB — CSF CULTURE W GRAM STAIN: Culture: NO GROWTH

## 2010-11-05 LAB — PROTEIN, CSF: Total  Protein, CSF: 26 mg/dL (ref 15–45)

## 2010-11-05 LAB — GLUCOSE, CSF: Glucose, CSF: 61 mg/dL (ref 43–76)

## 2010-11-06 ENCOUNTER — Ambulatory Visit: Payer: Self-pay | Admitting: Family Medicine

## 2010-11-14 ENCOUNTER — Telehealth: Payer: Self-pay | Admitting: Family Medicine

## 2010-11-14 NOTE — Telephone Encounter (Signed)
Need refill on Alprazolam.  Call when ready

## 2010-11-15 MED ORDER — ALPRAZOLAM 2 MG PO TABS: ORAL_TABLET | ORAL | Status: DC

## 2010-11-15 NOTE — Telephone Encounter (Signed)
Refilled xanax

## 2010-11-28 LAB — DIFFERENTIAL
Basophils Absolute: 0 10*3/uL (ref 0.0–0.1)
Basophils Relative: 0 % (ref 0–1)
Eosinophils Absolute: 0.2 10*3/uL (ref 0.0–0.7)
Monocytes Absolute: 0.5 10*3/uL (ref 0.1–1.0)
Neutro Abs: 7.9 10*3/uL — ABNORMAL HIGH (ref 1.7–7.7)

## 2010-11-28 LAB — URINALYSIS, ROUTINE W REFLEX MICROSCOPIC
Nitrite: NEGATIVE
Specific Gravity, Urine: 1.015 (ref 1.005–1.030)
Urobilinogen, UA: 1 mg/dL (ref 0.0–1.0)

## 2010-11-28 LAB — PREGNANCY, URINE: Preg Test, Ur: NEGATIVE

## 2010-11-28 LAB — BASIC METABOLIC PANEL
CO2: 26 mEq/L (ref 19–32)
Calcium: 9.1 mg/dL (ref 8.4–10.5)
Chloride: 106 mEq/L (ref 96–112)
Creatinine, Ser: 0.65 mg/dL (ref 0.4–1.2)
Glucose, Bld: 98 mg/dL (ref 70–99)
Sodium: 139 mEq/L (ref 135–145)

## 2010-11-28 LAB — CBC
Hemoglobin: 14.5 g/dL (ref 12.0–15.0)
MCHC: 35 g/dL (ref 30.0–36.0)
MCV: 96.3 fL (ref 78.0–100.0)
RDW: 12.1 % (ref 11.5–15.5)

## 2010-11-28 LAB — URINE MICROSCOPIC-ADD ON

## 2011-01-01 NOTE — Group Therapy Note (Signed)
Sandra Briggs, Sandra Briggs NO.:  [id number]   MEDICAL RECORD NO.:  [id number]          PATIENT TYPE:  WOC   LOCATION:  [facility]                   FACILITY:  [facility]   PHYSICIAN:  Argentina Donovan, MD        DATE OF BIRTH:  07-26-1976   DATE OF SERVICE:  03/03/2008                                  CLINIC NOTE   CHIEF COMPLAINT:  Severe heavy continuous periods with almost bleeding  every day.   PRESENT ILLNESS:  The patient is a 35 year old Caucasian female gravida  seven, para 3-0-4-3 with a history of three cesarean sections.  The  youngest baby, age 32.  Over the past year the patient has had almost  continual bleeding with passage of large clots and which she thought was  tissue that she took in to see her doctor in the family practice area.  She had an endometrial biopsy, and a CT of the pelvis which was  essentially normal.  She has had a history of a tubal ligation after her  last C-section, we have discussed with the patient the different options  for control of her bleeding.  I mentioned the IUD, Mirena, because with  the pain, I think, that we have a better chance of controlling her pain,  especially if she has adenomyosis; and we talked about NovaSure  endometrial ablation, and possibly laparoscopic-assisted vaginal  hysterectomy.   The patient thought she would like to try the ablation first.  She has  heard a lot about it.  I have given her a written pamphlet, and we  discussed the possible complications with it; birth control because of  the tubal ligation is not a problem; and she says that the control of  the bleeding is much more important than the pain, because Motrin takes  care of the pain.  We have talked to her about the percentages that it  may just possibly reduce reduction of the bleeding, but she still will  probably will have some over a period of time.  She had agreed to this.   PAST MEDICAL HISTORY:  Noncontributory.   PAST SURGICAL  HISTORY:  She has has three cesarean sections, wisdom  teeth removed, and an appendectomy.   ALLERGIES:  Only to SULFA; however, her grandfather had a serious  reaction to Anectine and she was told that she should always report that  prior to any surgical procedure so they have avoided Anectine with her  procedures.   FAMILY HISTORY:  Mother and grandmother with a history of endometrial  carcinoma, i.e. her endometrial biopsy was normal.   REVIEW OF SYSTEMS:  Negative with the exception of the present illness.   PHYSICAL EXAMINATION:  VITAL SIGNS:  Weight to 147.  Height 5 feet 4  inches tall.  Blood pressure 113/78, pulse 86 per minute, temperature is  98.3.  GENERAL:  A well-developed, well-nourished, red-headed female in no  acute distress.  HEENT:  Within normal limits.  NECK:  Supple.  Thyroid symmetrical with no masses.  BACK:  Erect.  LUNGS:  Clear to auscultation and percussion.  HEART:  No murmurs, normal sinus rhythm.  BREASTS:  Symmetrical, no dominant masses.  No nipple discharge.  ABDOMEN:  Soft, nontender.  No masses or organomegaly.  EXTREMITIES:  No edema.  No varicosities.  DTRs within normal limits.  GENITALIA:  External genitalia normal.  BUS within normal limits.  Vagina clean and well rugated.  The cervix clean and nulliparous.  The  uterus anterior, normal size, shape, and consistency.  Adnexa normal.  RECTAL EXAM:  Deferred.   IMPRESSION:  The patient in good physical health with intractable  menometrorrhagia and dysmenorrhea, possible adenomyosis.   PLAN:  NovaSure endometrial ablation.  We have discussed this with Dr.  Marice Potter, and she has been introduced to the patient.           ______________________________  Argentina Donovan, MD     PR/MEDQ  D:  03/03/2008  T:  03/03/2008  Job:  [id number]

## 2011-01-01 NOTE — Op Note (Signed)
NAME:  Sandra Briggs, DEVOS                 ACCOUNT NO.:  [id number]   MEDICAL RECORD NO.:  [id number]          PATIENT TYPE:  MAT   LOCATION:  [facility]                          [facility]   PHYSICIAN:  Allie Bossier, MD        DATE OF BIRTH:  [birth year]   DATE OF PROCEDURE:  DATE OF DISCHARGE:                               OPERATIVE REPORT   PREOPERATIVE DIAGNOSIS:  Menometrorrhagia.   POSTOPERATIVE DIAGNOSIS:  Menometrorrhagia.   PROCEDURE:  NovaSure endometrial ablation.   SURGEON:  Allie Bossier, MD   ANESTHESIA:  Alfredo Martinez, MD   COMPLICATIONS:  None.   ESTIMATED BLOOD LOSS:  Minimal.   SPECIMENS:  None.   DETAILED PROCEDURE AND FINDINGS:  The risks, benefits, alternatives of  surgery were explained, understood, and accepted, consents were signed.  She was taken to the operating room, placed in a dorsal lithotomy  position.  General anesthesia was applied without complication.  Her  vagina was prepped and draped in usual sterile fashion.  Her bladder was  emptied with a Robinson catheter.  Bimanual exam revealed normal size  and shape, anteverted uterus, and nonenlarged adnexa.  A speculum was  placed.  The anterior lip of the cervix was grasped with a single-tooth  tenaculum.  Cervix was then gently and easily dilated to a #8 [place]  dilator.  The NovaSure device was placed after the uterus was sounded  and the endocervical canal was sounded.  The endometrial cavity length  was set at 5.  The device was seated properly.  The width measured 4.3.  The equipment tested properly and then over the next 1 minute and 47  seconds, the NovaSure ablation occurred.  After allowing several seconds  for cooling, the instrument was removed.  The tenaculum site on the left  was oozing and I placed a 3-0 chromic figure-of-eight suture to yield  excellent hemostasis.  She was extubated and taken to recovery room in  stable condition.  Instrument, sponge, and needle counts were correct.      Allie Bossier, MD  Electronically Signed     MCD/MEDQ  D:  04/04/2008  T:  04/05/2008  Job:  [id number]

## 2011-01-04 NOTE — Discharge Summary (Signed)
   NAME:  Sandra Briggs, Sandra Briggs                           ACCOUNT NO.:  [id number]   MEDICAL RECORD NO.:  [id number]                   PATIENT TYPE:  INP   LOCATION:  9104                                 [facility]   PHYSICIAN:  Ebbie Ridge, M.D.               DATE OF BIRTH:  1976/05/06   DATE OF ADMISSION:  09/21/2002  DATE OF DISCHARGE:  09/25/2002                                 DISCHARGE SUMMARY   DISCHARGE DIAGNOSES:  1. Low-transverse cesarean section of a female infant secondary to     nonreassuring fetal heart tones.  2. Pregnancy-induced hypertension.  3. Induction of labor secondary to number 2.   DISCHARGE MEDICATIONS:  1. Percocet 5/325 mg one to two p.o. q.4-6h. p.Briggs.n. pain.  2. Prenatal vitamins.  3. Micronor contraceptive pills.   HOSPITAL COURSE:  The patient is a 35 year old female, G6, P1-0-5-1 who was  admitted at 23 and 1 weeks for induction of labor secondary to transient  hypertension and oliguria.  She spontaneously ruptured her membranes after  one Cervidil was placed.  The baby had thick meconium.  IUPC was placed, and  amnioinfusion begun.  Patient had dilated to about 5 cm, 95% effacement, and  the fetus was at 1+ station but was occiput posterior position when he began  to have some prolonged decels. Patient was taken to the operating room for  low-transverse C-section, which was done without complications.  Patient did  have a nuchal cord and some meconium-stained fluid.  She delivered a healthy  female infant weighing 8 pounds 4 ounces with Apgars of 8 and 9 at 1 and 5  minutes, respectively.   Postoperatively, the patient had no problems.  Her breast milk was slow to  come in, but lactation consultant saw her daily to provide assistance.  She  was discharged home on September 25, 2002, with Micronor for contraception and  Percocet for pain.   FOLLOW UP:  She will see Dr. Wilson Singer at [facility] in six  weeks for a postpartum check.                                              Ebbie Ridge, M.D.    CH/MEDQ  D:  12/12/2002  T:  12/12/2002  Job:  [id number]

## 2011-01-04 NOTE — Op Note (Signed)
   NAME:  Sandra Briggs, Sandra Briggs                           ACCOUNT NO.:  [id number]   MEDICAL RECORD NO.:  [id number]                   PATIENT TYPE:  INP   LOCATION:  9104                                 FACILITY:  [facility]   PHYSICIAN:  Phil D. Okey Dupre, M.D.                  DATE OF BIRTH:  1976-02-09   DATE OF PROCEDURE:  09/21/2002  DATE OF DISCHARGE:                                 OPERATIVE REPORT   PROCEDURE:  Low flap cesarean section.   PREOPERATIVE DIAGNOSIS:  Nonreassuring fetal heart pattern.   POSTOPERATIVE DIAGNOSES:  1. Nonreassuring fetal heart pattern.  2. Meconium.  3. Nuchal cord.   SURGEON:  Javier Glazier. Okey Dupre, M.D.   PROCEDURE IN DETAIL:  Under satisfactory epidural anesthesia with the  patient in dorsal supine position, a Foley catheter in the urinary bladder,  the abdomen was prepped and draped in the usual sterile manner and entered  through a transverse suprapubic incision extending for 16 cm and situated 3  cm above the symphysis pubis.  The abdomen was entered by layers and on  entering the peritoneal cavity, the visceral peritoneum and the anterior  surface of the uterus were opened transversely by sharp dissection.  The  bladder pushed away from the lower uterine segment which was entered by  sharp and blunt dissection.  On entering the uterine cavity, a small  superficial laceration was made in the cheek of the infant.  The baby was  easily delivered, handed to the pediatrician after the cord was doubly  clamped, divided.  Blood for pH was taken which was 7.38 and the placenta  was manually removed, the uterus explored and closed with a continuous  running-lock 0 Vicryl in an atraumatic needle.  A second layer of one of the  same was used for a second layer of closure.  The area was observed for  bleeding, none was noted.  The fascia was closed from either end of the  incision meeting in the mid point with a continuous running alternating-lock  0 Vicryl in an atraumatic  needle.  Subcutaneous bleeders were controlled  with hot cautery.  Skin edges were approximated with skin staples.  A dry  sterile dressing was applied.  The patient tolerated the procedure well with  a 750 mL blood loss and was transferred to the recovery room in satisfactory  condition.                                               Phil D. Okey Dupre, M.D.    PDR/MEDQ  D:  09/22/2002  T:  09/22/2002  Job:  [id number]

## 2011-01-04 NOTE — Discharge Summary (Signed)
Sandra Briggs, Sandra Briggs                 ACCOUNT NO.:  [id number]   MEDICAL RECORD NO.:  [id number]          PATIENT TYPE:  INP   LOCATION:  9304                          FACILITY:  [facility]   PHYSICIAN:  Conni Elliot, M.D.DATE OF BIRTH:  1976/02/21   DATE OF ADMISSION:  11/30/2004  DATE OF DISCHARGE:  12/02/2004                                 DISCHARGE SUMMARY   DISCHARGE DIAGNOSES:  1.  Pregnancy.  2.  Pyelonephritis.  3.  Probable nephrolithiasis.   DISCHARGE MEDICATIONS:  1.  Percocet one to two q.6h. as needed for pain  2.  Keflex 500 mg q.6h. for 12 days after discharge.   DIET:  Per restrictions given by Dr. Isabel Caprice, her urologist, for stones.   WOUND CARE:  Not applicable.   SEXUAL ACTIVITY:  No restrictions.   The patient was to follow up at her primary OB which is [facility].   Briefly, this is a G8 P2-4-1-2 female, 35 years old, who was admitted on  November 30, 2004 for a recheck of UTI. She had Macrobid twice a day for 8 days  and was switched to Keflex prior to admission but was still symptomatic. She  reported off and on fevers at that point in time up to 101 on the day prior  to admission. She also noted some flank pain bilaterally. She had a white  count at time of admission of 9.2, positive nitrites, trace hemoglobin, and  11-20 wbc's in her urine at time of admission. The patient was admitted for  IV antibiotic therapy and cultures of her urine. She had an ultrasound done  that showed normal-size kidneys with no significant hydronephrosis or mass.  The patient was started on IV cefotetan 1 g q.12h., a regular diet, and IV  fluids. The patient tolerated antibiotics well and received a total of 48  hours of antibiotics and was completed afebrile during the entire course. On  the day of discharge she was feeling much better, still some occasional back  discomfort but no fevers or chills. She was eating and drinking okay and  taking medications okay.  She had only needed approximately one to two  overall narcotic medications since time of admission. Her exam still showed  some mild flank tenderness to palpation and some slight right-sided CVA  tenderness. Urine culture showed greater than 100,000 E. coli that were  pansensitive, so the patient was discharged on Keflex for a total of 14  days. She was given instructions about how to strain her urine with a long  history of nephrolithiasis per Dr. Isabel Caprice. She was given Percocet for pain  relief and was told to follow up with her  primary care OB which is [facility] for routine follow-up at  approximately 2 weeks after discharge. Her discharge labs were the same as  her admission labs. The patient was discharged in improved condition on  December 02, 2004.       RM/MEDQ  D:  02/01/2005  T:  02/01/2005  Job:  [id number]

## 2011-01-04 NOTE — Op Note (Signed)
NAMEAILIE, GAGE                 ACCOUNT NO.:  [id number]   MEDICAL RECORD NO.:  [id number]          PATIENT TYPE:  INP   LOCATION:  9372                          FACILITY:  [facility]   PHYSICIAN:  Conni Elliot, M.D.DATE OF BIRTH:  01/03/1976   DATE OF PROCEDURE:  06/25/2005  DATE OF DISCHARGE:                                 OPERATIVE REPORT   PREOPERATIVE DIAGNOSES:  1.  Thirty-seven and 1 week 1 day intrauterine pregnancy.  2.  Severe preeclampsia,  3.  Previous C-section.   POSTOPERATIVE DIAGNOSES:  1.  Thirty-seven and 1 week 1 day intrauterine pregnancy.  2.  Severe preeclampsia,  3.  Previous C-section.   PROCEDURE:  Repeat low transverse C-section via Pfannenstiel.  BTL.   SURGEON:  Dr. Gavin Potters   ASSISTANT:  Dr. Kathlyn Sacramento   ANESTHESIA:  Spinal.   COMPLICATIONS:  None.   ESTIMATED BLOOD LOSS:  700 mL.   FLUIDS:  1800 mL.   URINE OUTPUT:  150 mL clear urine at the end of procedure.   INDICATIONS:  A 35 year old G9, P2-0-5-2 at 37/1, presented with a headache  and vision changes, who had a history of preeclampsia.   FINDINGS:  A female infant in cephalic presentation, pediatrics present at  delivery, Apgars 8 and 9, weight 6 pounds 8 ounces, normal uterus, tubes,  and ovaries.   DESCRIPTION OF PROCEDURE:  The patient was taken to the operating room where  spinal anesthesia was found to be adequate.  She was then prepped and draped  in the normal sterile fashion in the dorsal supine position with a leftward  tilt.  Pfannenstiel skin incision was then made with a scalpel in the region  of the previous scar and carried through the underlying layer of fascia with  the scalpel.  The fascia was then incised in the midline and the incision  extended laterally with the Mayo scissors.  The superior aspect of the  fascial incision was then grasped with Kocher clamps, elevated, and the  rectus muscles was separated in the midline and the peritoneum identified,  tented up, and entered sharply.  Peritoneum incision was then extended  superiorly and inferiorly manually with good visualization of the bladder.  The bladder blade was then inserted and the vesicouterine peritoneum  identified, grasped with pick-ups, and entered sharply with the Metzenbaum  scissors.  The incision was then extended laterally, and the bladder flap  was created digitally.  The bladder blade was then reinserted and the lower  uterine segment incised in a transverse fashion with a scalpel.  The bladder  blade was then removed and the infant's head delivered atraumatically.  The  nose and mouth were suctioned with a bulb syringe and the cord clamped and  cut.  The infant was handed off to the awaiting pediatricians.  Cord blood  was sent.  Placenta was then removed and the uterus exteriorized and cleared  of all clots and debris.  The uterine incision was repaired in a running  locked fashion, the second layer the same suture was used to obtain  excellent hemostasis.  The bladder was then repaired with a running stitch  and the uterus returned.  The patient's left fallopian tube was then  identified proximally, grasped with a Babcock clamp.  The tube was then  followed out to the fimbria.  Babcock clamp was then used to grasp the tube  at approximately 4 cm from the cornu, __________ then ligated with free tie  of plain gut x 2 and excised.  Good hemostasis was noted.  The right  fallopian tube was then ligated x 2 and 3 cm segment excised in a similar  fashion.  Excellent hemostasis was noted.  The uterus was then returned to  the abdomen.  The gutters were cleared of all clots, and the muscle was  closed with 3 interrupted stitches.  The fascia was then reapproximated in a  running fashion.  The subcutaneous fat was then reapproximated in a running  fashion.  Skin was closed with staples.  The patient tolerated the procedure  well.  Sponge, lap, and needle counts were correct  x 2.  The patient was  taken to the recovery room in stable condition.     ______________________________  [name] Merlene Morse, MD    ______________________________  Conni Elliot, M.D.    ABC/MEDQ  D:  06/25/2005  T:  06/25/2005  Job:  [id number]

## 2011-01-04 NOTE — Discharge Summary (Signed)
Sandra Briggs, Sandra Briggs                 ACCOUNT NO.:  [id number]   MEDICAL RECORD NO.:  [id number]          PATIENT TYPE:  INP   LOCATION:  9160                          FACILITY:  [facility]   PHYSICIAN:  Conni Elliot, M.D.DATE OF BIRTH:  [birth year]   DATE OF ADMISSION:  06/24/2005  DATE OF DISCHARGE:  06/27/2005                                 DISCHARGE SUMMARY   ADMISSION DIAGNOSES:  1.  Severe preeclampsia.  2.  History of cesarean section x2.   DISCHARGE DIAGNOSES:  1.  Severe preeclampsia.  2.  Status post repeat low transverse cesarean section.  3.  Status post bilateral tubal ligation.   OPERATION/PROCEDURE:  1.  Bilateral tubal ligation.  2.  Low transverse cesarean section done by Dr. Gavin Potters.   See operative note for more information.   ADMITTING HISTORY AND PHYSICAL:  The patient is a 35 year old gravida 9,  para 2-0-6-2, at 37 weeks who presented with cramping. She also she has had  a headache on and off for three days, blurred vision.  It feels like her  urine output is decreasing. She does not feel like she empties her bladder.   MEDICATIONS:  Prenatal vitamins, Zoloft.   ALLERGIES:  SULFA, SUCCINYLCHOLINE.   OB HISTORY:  One TAB.  One SAB at 17 weeks.  Four SABs at seven weeks.  History of PIH x2.   GYN HISTORY:  Cryo.   PAST MEDICAL HISTORY:  Enzyme deficiency.   PHYSICAL EXAMINATION:  VITAL SIGNS: Pulse 95, blood pressure 138/74.  GENERAL:  Well-developed, well-nourished female in no acute distress.  HEART:  Normal S1 and S2.  No murmurs, gallops or rubs.  CHEST:  Clear to auscultation bilaterally.  ABDOMEN:  Gravid.   HOSPITAL COURSE:  The patient was admitted.  She was started.  She had PIH  labs done.  She was started on 24-hour urine.  On November 7, the patient  was found to have decreased urine output so her headache had resolved but as  it was returning, it was felt that she most likely had severe preeclampsia  and the patient had a cesarean  section done at that time.  The patient  tolerated the procedure well. She was continued on magnesium for greater  than 24 hours posterior delivery.  Her blood pressures remained stable.  She  had good urine output.  The patient was discharged to home in stable  condition.  She was to follow up in six weeks with her primary care  physician.  The patient had a BTL for her birth control.   DISCHARGE MEDICATIONS:  Ibuprofen, Percocet, Colace.  She is to continue her  prenatal vitamins.   DIET:  Ad lib.     ______________________________  [name]. Merlene Morse, MD    ______________________________  Conni Elliot, M.D.    ABC/MEDQ  D:  06/28/2005  T:  06/28/2005  Job:  [id number]

## 2011-01-17 ENCOUNTER — Emergency Department (HOSPITAL_COMMUNITY): Payer: Self-pay

## 2011-01-17 ENCOUNTER — Telehealth: Payer: Self-pay | Admitting: Family Medicine

## 2011-01-17 ENCOUNTER — Emergency Department (HOSPITAL_COMMUNITY)
Admission: EM | Admit: 2011-01-17 | Discharge: 2011-01-17 | Disposition: A | Discharge status: Home / Self Care | Payer: Self-pay | Attending: Emergency Medicine | Admitting: Emergency Medicine

## 2011-01-17 DIAGNOSIS — F319 Bipolar disorder, unspecified: Secondary | ICD-10-CM | POA: Insufficient documentation

## 2011-01-17 DIAGNOSIS — R000 Tachycardia, unspecified: Secondary | ICD-10-CM | POA: Insufficient documentation

## 2011-01-17 DIAGNOSIS — R319 Hematuria, unspecified: Secondary | ICD-10-CM | POA: Insufficient documentation

## 2011-01-17 DIAGNOSIS — N23 Unspecified renal colic: Secondary | ICD-10-CM | POA: Insufficient documentation

## 2011-01-17 DIAGNOSIS — R109 Unspecified abdominal pain: Secondary | ICD-10-CM | POA: Insufficient documentation

## 2011-01-17 LAB — POCT I-STAT, CHEM 8
Chloride: 105 mEq/L (ref 96–112)
Creatinine, Ser: 0.9 mg/dL (ref 0.4–1.2)
Glucose, Bld: 88 mg/dL (ref 70–99)
Hemoglobin: 14.6 g/dL (ref 12.0–15.0)
Potassium: 3.9 mEq/L (ref 3.5–5.1)
Sodium: 140 mEq/L (ref 135–145)

## 2011-01-17 LAB — URINALYSIS, ROUTINE W REFLEX MICROSCOPIC
Bilirubin Urine: NEGATIVE
Ketones, ur: NEGATIVE mg/dL
Nitrite: NEGATIVE
Protein, ur: NEGATIVE mg/dL
Urobilinogen, UA: 0.2 mg/dL (ref 0.0–1.0)

## 2011-01-17 NOTE — Telephone Encounter (Signed)
Pt reports back pain that started at 2pm.  Urine is pink in color.  Feels like kidney stone that she had in the past. No fever. No n/v.  Pt states that the pain is intense and getting worse.  Recommended pt go to the ER or urgent care for evaluation.

## 2011-01-18 ENCOUNTER — Ambulatory Visit (INDEPENDENT_AMBULATORY_CARE_PROVIDER_SITE_OTHER): Payer: Self-pay | Admitting: Sports Medicine

## 2011-01-18 ENCOUNTER — Encounter: Payer: Self-pay | Admitting: Sports Medicine

## 2011-01-18 VITALS — BP 123/82 | HR 93 | Temp 98.4°F | Ht 64.0 in | Wt 166.6 lb

## 2011-01-18 DIAGNOSIS — R300 Dysuria: Secondary | ICD-10-CM

## 2011-01-18 LAB — POCT URINALYSIS DIPSTICK
Glucose, UA: NEGATIVE
Ketones, UA: NEGATIVE
Protein, UA: NEGATIVE
Spec Grav, UA: 1.02
Urobilinogen, UA: 0.2

## 2011-01-18 LAB — CBC WITH DIFFERENTIAL/PLATELET
Basophils Absolute: 0 10*3/uL (ref 0.0–0.1)
Basophils Relative: 0 % (ref 0–1)
Eosinophils Absolute: 0.2 10*3/uL (ref 0.0–0.7)
Hemoglobin: 13.8 g/dL (ref 12.0–15.0)
MCH: 32 pg (ref 26.0–34.0)
MCHC: 33.4 g/dL (ref 30.0–36.0)
Neutro Abs: 5.9 10*3/uL (ref 1.7–7.7)
Neutrophils Relative %: 74 % (ref 43–77)
Platelets: 167 10*3/uL (ref 150–400)
RBC: 4.31 MIL/uL (ref 3.87–5.11)

## 2011-01-18 LAB — COMPREHENSIVE METABOLIC PANEL
ALT: 13 U/L (ref 0–35)
AST: 15 U/L (ref 0–37)
Albumin: 4.3 g/dL (ref 3.5–5.2)
Alkaline Phosphatase: 54 U/L (ref 39–117)
Potassium: 4.6 mEq/L (ref 3.5–5.3)
Sodium: 140 mEq/L (ref 135–145)
Total Bilirubin: 0.4 mg/dL (ref 0.3–1.2)
Total Protein: 6.5 g/dL (ref 6.0–8.3)

## 2011-01-18 LAB — POCT UA - MICROSCOPIC ONLY

## 2011-01-18 MED ORDER — PHENAZOPYRIDINE HCL 200 MG PO TABS: 200.0000 mg | ORAL_TABLET | Freq: Three times a day (TID) | ORAL | Status: AC

## 2011-01-18 MED ORDER — CIPROFLOXACIN HCL 500 MG PO TABS: 500.0000 mg | ORAL_TABLET | Freq: Two times a day (BID) | ORAL | Status: AC

## 2011-01-18 MED ORDER — KETOROLAC TROMETHAMINE 30 MG/ML IJ SOLN
30.0000 mg | Freq: Once | INTRAMUSCULAR | Status: AC
  Administered 2011-01-18: 30 mg via INTRAMUSCULAR

## 2011-01-18 MED ORDER — OXYCODONE-ACETAMINOPHEN 10-325 MG PO TABS: 1.0000 | ORAL_TABLET | Freq: Four times a day (QID) | ORAL | Status: AC | PRN

## 2011-01-18 NOTE — Progress Notes (Signed)
  Subjective:    Patient ID: Sandra Briggs, female    DOB: 02-May-1976, 35 y.o.   MRN: [id number]  HPI Dysuria/flank pain:  Pt with a history of nephrolithiasis, used to see Dr. Isabel Caprice with [facility], cannot see them anymore 2/2 insurance issues.  Now with 2 days of R flank pain radiating to groin, also with gross hematuria.  Pain is stabbing in nature.  Went to ED, CT abd/pelv negative.  Urine grossly bloody.  No fevers/chills.  No N/V/D/C.  No Vag DC, no rashes.    Review of Systems    See HPI Objective:   Physical Exam  Constitutional: She appears well-developed and well-nourished. She appears distressed.  Abdominal: Soft. She exhibits no distension and no mass. There is tenderness. There is no rebound and no guarding.       TTP R CVA and suprapubic.  Skin: Skin is warm and dry.          Assessment & Plan:

## 2011-01-18 NOTE — Patient Instructions (Signed)
Great to see you, Take the meds as below. Come back to see me in a week after you have had the ultrasound.  Ihor Austin. Benjamin Stain, M.D.

## 2011-01-18 NOTE — Assessment & Plan Note (Addendum)
Prior hx nephrolithiasis. Toradol IM in office. CT neg but previous stones were not calcium per pt (she thinks cysteine) or she may have passed it already. Will US renal system. Percocet for pain. Phenazopyridine to numb urinary tract. UCx and wills start cipro x 14d with her flank pain. RTC 1 week.

## 2011-01-20 LAB — URINE CULTURE
Colony Count: NO GROWTH
Organism ID, Bacteria: NO GROWTH

## 2011-02-11 ENCOUNTER — Other Ambulatory Visit: Payer: Self-pay | Admitting: Family Medicine

## 2011-02-11 NOTE — Telephone Encounter (Signed)
Needs refill for Alprazolam to pick, please call when it is ready.

## 2011-02-11 NOTE — Telephone Encounter (Signed)
Will fwd. To PCP. .Curley Fayette  

## 2011-02-12 ENCOUNTER — Encounter: Payer: Self-pay | Admitting: Family Medicine

## 2011-02-12 MED ORDER — ALPRAZOLAM 2 MG PO TABS: ORAL_TABLET | ORAL | Status: DC

## 2011-02-12 NOTE — Telephone Encounter (Signed)
Addended by: Milinda Antis F on: 02/12/2011 01:36 PM   Modules accepted: Orders

## 2011-02-12 NOTE — Telephone Encounter (Signed)
Refill at front desk. 

## 2011-03-09 ENCOUNTER — Encounter: Payer: Self-pay | Admitting: Family Medicine

## 2011-04-25 ENCOUNTER — Inpatient Hospital Stay (INDEPENDENT_AMBULATORY_CARE_PROVIDER_SITE_OTHER)
Admission: RE | Admit: 2011-04-25 | Discharge: 2011-04-25 | Disposition: A | Discharge status: Home / Self Care | Payer: Self-pay | Source: Ambulatory Visit | Attending: Emergency Medicine | Admitting: Emergency Medicine

## 2011-04-25 DIAGNOSIS — R6889 Other general symptoms and signs: Secondary | ICD-10-CM

## 2011-05-15 LAB — POCT I-STAT, CHEM 8
BUN: 15
Calcium, Ion: 1.21
Chloride: 107
HCT: 43
Potassium: 4.3
Sodium: 142

## 2011-05-15 LAB — GC/CHLAMYDIA PROBE AMP, GENITAL
Chlamydia, DNA Probe: NEGATIVE
GC Probe Amp, Genital: NEGATIVE

## 2011-05-15 LAB — POCT PREGNANCY, URINE
Operator id: 282151
Preg Test, Ur: NEGATIVE

## 2011-05-15 LAB — WET PREP, GENITAL: Trich, Wet Prep: NONE SEEN

## 2011-05-16 LAB — URINALYSIS, ROUTINE W REFLEX MICROSCOPIC
Bilirubin Urine: NEGATIVE
Glucose, UA: NEGATIVE
Specific Gravity, Urine: 1.02
pH: 6.5

## 2011-05-16 LAB — DIFFERENTIAL
Basophils Absolute: 0.1
Basophils Relative: 1
Eosinophils Absolute: 0.3
Monocytes Absolute: 0.7
Monocytes Relative: 8
Neutrophils Relative %: 60

## 2011-05-16 LAB — CBC
MCHC: 35
MCV: 93.4
RBC: 4.28
RDW: 11.8

## 2011-05-16 LAB — POCT I-STAT, CHEM 8
Calcium, Ion: 1.2
Glucose, Bld: 90
HCT: 40
Hemoglobin: 13.6
Potassium: 4.1

## 2011-05-16 LAB — URINE MICROSCOPIC-ADD ON

## 2011-05-16 LAB — POCT PREGNANCY, URINE: Operator id: 151321

## 2011-05-17 LAB — CBC
HCT: 41.9
Hemoglobin: 14.1
MCV: 95.7
RBC: 4.38
WBC: 9

## 2011-05-17 LAB — WET PREP, GENITAL
Clue Cells Wet Prep HPF POC: NONE SEEN
Trich, Wet Prep: NONE SEEN
Yeast Wet Prep HPF POC: NONE SEEN

## 2011-05-17 LAB — URINALYSIS, ROUTINE W REFLEX MICROSCOPIC
Bilirubin Urine: NEGATIVE
Glucose, UA: NEGATIVE
Glucose, UA: NEGATIVE
Ketones, ur: 15 — AB
Ketones, ur: NEGATIVE
Leukocytes, UA: NEGATIVE
Protein, ur: NEGATIVE
Urobilinogen, UA: 0.2
pH: 7

## 2011-05-17 LAB — POCT PREGNANCY, URINE: Preg Test, Ur: NEGATIVE

## 2011-05-17 LAB — BASIC METABOLIC PANEL
BUN: 14
GFR calc non Af Amer: >60
Potassium: 3.6

## 2011-05-17 LAB — PROTIME-INR: Prothrombin Time: 13.7

## 2011-05-17 LAB — URINE MICROSCOPIC-ADD ON

## 2011-05-20 LAB — POCT I-STAT, CHEM 8
BUN: 16
Calcium, Ion: 1.17
Calcium, Ion: 1.1 — ABNORMAL LOW
Creatinine, Ser: 0.9
Creatinine, Ser: 0.7
Glucose, Bld: 97
Hemoglobin: 13.6
TCO2: 25
TCO2: 24

## 2011-05-20 LAB — URINALYSIS, ROUTINE W REFLEX MICROSCOPIC
Bilirubin Urine: NEGATIVE
Glucose, UA: NEGATIVE
Hgb urine dipstick: NEGATIVE
Protein, ur: NEGATIVE
Protein, ur: NEGATIVE
Urobilinogen, UA: 1
Urobilinogen, UA: 0.2

## 2011-05-20 LAB — POCT PREGNANCY, URINE: Preg Test, Ur: NEGATIVE

## 2011-05-20 LAB — URINE MICROSCOPIC-ADD ON

## 2011-05-21 LAB — COMPREHENSIVE METABOLIC PANEL
Albumin: 4.2
Alkaline Phosphatase: 57
BUN: 10
Calcium: 9.1
Creatinine, Ser: 0.73
Potassium: 3.7
Total Protein: 7

## 2011-05-21 LAB — URINALYSIS, ROUTINE W REFLEX MICROSCOPIC
Ketones, ur: NEGATIVE
Nitrite: POSITIVE — AB
Specific Gravity, Urine: 1.016
pH: 7

## 2011-05-21 LAB — LIPASE, BLOOD: Lipase: 20

## 2011-05-21 LAB — CBC
HCT: 40.9
MCHC: 34.1
RDW: 12

## 2011-05-21 LAB — URINE MICROSCOPIC-ADD ON

## 2011-05-21 LAB — DIFFERENTIAL
Basophils Relative: 1
Eosinophils Relative: 3
Lymphocytes Relative: 27
Monocytes Relative: 6
Neutro Abs: 5.7

## 2011-05-22 LAB — URINALYSIS, ROUTINE W REFLEX MICROSCOPIC
Bilirubin Urine: NEGATIVE
Glucose, UA: NEGATIVE
Hgb urine dipstick: NEGATIVE
Ketones, ur: NEGATIVE
Nitrite: NEGATIVE
Nitrite: NEGATIVE
Specific Gravity, Urine: 1.022
Specific Gravity, Urine: 1.022
pH: 6.5
pH: 6.5

## 2011-05-22 LAB — URINE MICROSCOPIC-ADD ON

## 2011-05-22 LAB — URINE CULTURE

## 2011-05-23 LAB — URINALYSIS, ROUTINE W REFLEX MICROSCOPIC
Nitrite: NEGATIVE
Protein, ur: NEGATIVE mg/dL
Specific Gravity, Urine: 1.022 (ref 1.005–1.030)
Urobilinogen, UA: 0.2 mg/dL (ref 0.0–1.0)

## 2011-05-23 LAB — URINE CULTURE: Colony Count: 80000

## 2011-05-23 LAB — POCT I-STAT, CHEM 8
BUN: 10 mg/dL (ref 6–23)
Calcium, Ion: 1.19 mmol/L (ref 1.12–1.32)
Chloride: 106 mEq/L (ref 96–112)
Creatinine, Ser: 0.7 mg/dL (ref 0.4–1.2)
Glucose, Bld: 99 mg/dL (ref 70–99)
HCT: 38 % (ref 36.0–46.0)
Potassium: 3.6 mEq/L (ref 3.5–5.1)

## 2011-05-23 LAB — URINE MICROSCOPIC-ADD ON

## 2011-05-24 LAB — POCT URINALYSIS DIP (DEVICE)
Glucose, UA: NEGATIVE mg/dL
Nitrite: NEGATIVE
Urobilinogen, UA: 0.2 mg/dL (ref 0.0–1.0)

## 2011-05-30 LAB — DIFFERENTIAL
Lymphocytes Relative: 24
Lymphs Abs: 2.6
Monocytes Relative: 5
Neutro Abs: 7.3
Neutrophils Relative %: 68

## 2011-05-30 LAB — URINALYSIS, ROUTINE W REFLEX MICROSCOPIC
Bilirubin Urine: NEGATIVE
Glucose, UA: NEGATIVE
Ketones, ur: NEGATIVE
Nitrite: NEGATIVE
Protein, ur: NEGATIVE
pH: 8

## 2011-05-30 LAB — CBC
HCT: 42.4
Hemoglobin: 14.8
MCHC: 35
RDW: 11.6

## 2011-05-30 LAB — COMPREHENSIVE METABOLIC PANEL
BUN: 6
Calcium: 9.4
Glucose, Bld: 88
Total Protein: 7.2

## 2011-05-30 LAB — PREGNANCY, URINE: Preg Test, Ur: NEGATIVE

## 2011-06-11 ENCOUNTER — Ambulatory Visit (INDEPENDENT_AMBULATORY_CARE_PROVIDER_SITE_OTHER): Payer: Self-pay | Admitting: Family Medicine

## 2011-06-11 ENCOUNTER — Encounter: Payer: Self-pay | Admitting: Family Medicine

## 2011-06-11 VITALS — BP 121/83 | HR 102 | Ht 64.0 in | Wt 161.0 lb

## 2011-06-11 DIAGNOSIS — M542 Cervicalgia: Secondary | ICD-10-CM

## 2011-06-11 DIAGNOSIS — IMO0002 Reserved for concepts with insufficient information to code with codable children: Secondary | ICD-10-CM

## 2011-06-11 DIAGNOSIS — M792 Neuralgia and neuritis, unspecified: Secondary | ICD-10-CM

## 2011-06-11 DIAGNOSIS — F319 Bipolar disorder, unspecified: Secondary | ICD-10-CM

## 2011-06-11 DIAGNOSIS — Z23 Encounter for immunization: Secondary | ICD-10-CM

## 2011-06-11 MED ORDER — GABAPENTIN 100 MG PO CAPS: 100.0000 mg | ORAL_CAPSULE | Freq: Three times a day (TID) | ORAL | Status: DC

## 2011-06-11 MED ORDER — ALPRAZOLAM 2 MG PO TABS: ORAL_TABLET | ORAL | Status: DC

## 2011-06-11 MED ORDER — HYDROCODONE-ACETAMINOPHEN 5-500 MG PO TABS: 1.0000 | ORAL_TABLET | Freq: Two times a day (BID) | ORAL | Status: DC | PRN

## 2011-06-11 NOTE — Assessment & Plan Note (Signed)
vicodin refiled. Will start pt on scheduled neurontin. Will follow up in 1-2 months.

## 2011-06-11 NOTE — Assessment & Plan Note (Signed)
Broached issue of bipolar tx with lamictal. Discount list reviewed and I was not able to see any mood stabilizer apart from Lithium. Pt does not desire to try this. Psych red flags discussed. Will follow up with Dr. Raymondo Band as to cost effective mood stabilizers. Will follow up in 1-2 months.

## 2011-06-11 NOTE — Patient Instructions (Signed)
It was good to meet you today  I am starting yo on neurontin for your pain Come back to see me in 1 month Call with any questions,  God Bless,  Doree Albee MD

## 2011-06-11 NOTE — Progress Notes (Signed)
  Subjective:    Patient ID: Sandra Briggs, female    DOB: [birth year], 34 y.o.   MRN: [id number]  HPI Pt is here for new MD visit. Pt previous pt of Dr. Jeanice Lim  Neck Pain: Pt with previous hx/o radicular neck pain. 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 Pt has been using prn vicodin for this. Pt states that has controlled pain. However pt reports sedation with this medication. Pain sometimes worsened with strenuous activity. HAs not been on nerve modulating medication in the past.   Bioplar d/o: Pt reports hx/o Bipolar d/o that was treated in the past with depakote. Pt was previous pt of Dr. Pascal Lux. Pt states that she has been using xanax 1/4 tab prn for management of secondary anxiety. Pt states that the majority of her symptoms seem to be depressed mood per pt. Pt does report episodes of intermittent insomnia and racing thoughts. No HI/SI. Pt does report that she is in the process of getting divorced. Pt states she has had minimal stress from this. Pt's boyfriend has been very supportive    Review of Systems See HPI     Objective:   Physical Exam Gen: up in chair, NAD HEENT: NCAT, EOMI, TMs clear bilaterally, + spurlings bilaterally  CV: RRR, no murmurs auscultated PULM: CTAB, no wheezes, rales, rhoncii ABD: S/NT/+ bowel sounds  EXT: 2+ peripheral pulses   Assessment & Plan:

## 2011-07-10 ENCOUNTER — Ambulatory Visit (INDEPENDENT_AMBULATORY_CARE_PROVIDER_SITE_OTHER): Payer: Self-pay | Admitting: Family Medicine

## 2011-07-10 DIAGNOSIS — M542 Cervicalgia: Secondary | ICD-10-CM

## 2011-07-10 DIAGNOSIS — F319 Bipolar disorder, unspecified: Secondary | ICD-10-CM

## 2011-07-10 DIAGNOSIS — M792 Neuralgia and neuritis, unspecified: Secondary | ICD-10-CM

## 2011-07-10 DIAGNOSIS — IMO0002 Reserved for concepts with insufficient information to code with codable children: Secondary | ICD-10-CM

## 2011-07-10 LAB — POCT URINALYSIS DIPSTICK
Ketones, UA: NEGATIVE
Protein, UA: NEGATIVE
Spec Grav, UA: 1.02
pH, UA: 7

## 2011-07-10 MED ORDER — HYDROCODONE-ACETAMINOPHEN 5-500 MG PO TABS: 1.0000 | ORAL_TABLET | Freq: Two times a day (BID) | ORAL | Status: DC | PRN

## 2011-07-10 MED ORDER — LITHIUM CARBONATE 300 MG PO TABS: 300.0000 mg | ORAL_TABLET | Freq: Two times a day (BID) | ORAL | Status: DC

## 2011-07-10 MED ORDER — GABAPENTIN 300 MG PO CAPS: 300.0000 mg | ORAL_CAPSULE | Freq: Three times a day (TID) | ORAL | Status: DC

## 2011-07-10 NOTE — Patient Instructions (Signed)
Neuropathic Pain  We often think that pain has a physical cause. If we get rid of the cause, the pain should go away. Nerves themselves can also cause pain. It is called neuropathic pain, which means nerve abnormality. It may be difficult for the patients who have it and for the treating caregivers. Pain is usually described as acute (short-lived) or chronic (long-lasting). Acute pain is related to the physical sensations caused by an injury. It can last from a few seconds to many weeks, but it usually goes away when normal healing occurs. Chronic pain lasts beyond the typical healing time. With neuropathic pain, the nerve fibers themselves may be damaged or injured. They then send incorrect signals to other pain centers. The pain you feel is real, but the cause is not easy to find.   CAUSES   Chronic pain can result from diseases, such as diabetes and shingles (an infection related to chickenpox), or from trauma, surgery, or amputation. It can also happen without any known injury or disease. The nerves are sending pain messages, even though there is no identifiable cause for such messages.    Other common causes of neuropathy include diabetes, phantom limb pain, or Regional Pain Syndrome (RPS).   As with all forms of chronic back pain, if neuropathy is not correctly treated, there can be a number of associated problems that lead to a downward cycle for the patient. These include depression, sleeplessness, feelings of fear and anxiety, limited social interaction and inability to do normal daily activities or work.   The most dramatic and mysterious example of neuropathic pain is called "phantom limb syndrome." This occurs when an arm or a leg has been removed because of illness or injury. The brain still gets pain messages from the nerves that originally carried impulses from the missing limb. These nerves now seem to misfire and cause troubling pain.   Neuropathic pain often seems to have no cause. It responds  poorly to standard pain treatment.  Neuropathic pain can occur after:   Shingles (herpes zoster virus infection).   A lasting burning sensation of the skin, caused usually by injury to a peripheral nerve.   Peripheral neuropathy which is widespread nerve damage, often caused by diabetes or alcoholism.   Phantom limb pain following an amputation.   Facial nerve problems (trigeminal neuralgia).   Multiple sclerosis.   Reflex sympathetic dystrophy.   Pain which comes with cancer and cancer chemotherapy.   Entrapment neuropathy such as when pressure is put on a nerve such as in carpal tunnel syndrome.   Back, leg, and hip problems (sciatica).   Spine or back surgery.   HIV Infection or AIDS where nerves are infected by viruses.  Your caregiver can explain items in the above list which may apply to you.  SYMPTOMS   Characteristics of neuropathic pain are:   Severe, sharp, electric shock-like, shooting, lightening-like, knife-like.   Pins and needles sensation.   Deep burning, deep cold, or deep ache.   Persistent numbness, tingling, or weakness.   Pain resulting from light touch or other stimulus that would not usually cause pain.   Increased sensitivity to something that would normally cause pain, such as a pinprick.  Pain may persist for months or years following the healing of damaged tissues. When this happens, pain signals no longer sound an alarm about current injuries or injuries about to happen. Instead, the alarm system itself is not working correctly.   Neuropathic pain may get worse instead of   to serious disability. It is important to be aware that severe injury in a limb can occur without a proper, protective pain response.Burns, cuts, and other injuries may go unnoticed. Without proper treatment, these injuries can become infected or lead to further disability. Take any injury seriously, and consult your caregiver  for treatment. DIAGNOSIS  When you have a pain with no known cause, your caregiver will probably ask some specific questions:   Do you have any other conditions, such as diabetes, shingles, multiple sclerosis, or HIV infection?   How would you describe your pain? (Neuropathic pain is often described as shooting, stabbing, burning, or searing.)   Is your pain worse at any time of the day? (Neuropathic pain is usually worse at night.)   Does the pain seem to follow a certain physical pathway?   Does the pain come from an area that has missing or injured nerves? (An example would be phantom limb pain.)   Is the pain triggered by minor things such as rubbing against the sheets at night?  These questions often help define the type of pain involved. Once your caregiver knows what is happening, treatment can begin. Anticonvulsant, antidepressant drugs, and various pain relievers seem to work in some cases. If another condition, such as diabetes is involved, better management of that disorder may relieve the neuropathic pain.  TREATMENT  Neuropathic pain is frequently long-lasting and tends not to respond to treatment with narcotic type pain medication. It may respond well to other drugs such as antiseizure and antidepressant medications. Usually, neuropathic problems do not completely go away, but partial improvement is often possible with proper treatment. Your caregivers have large numbers of medications available to treat you. Do not be discouraged if you do not get immediate relief. Sometimes different medications or a combination of medications will be tried before you receive the results you are hoping for. See your caregiver if you have pain that seems to be coming from nowhere and does not go away. Help is available.  SEEK IMMEDIATE MEDICAL CARE IF:   There is a sudden change in the quality of your pain, especially if the change is on only one side of the body.   You notice changes of the  skin, such as redness, black or purple discoloration, swelling, or an ulcer.   You cannot move the affected limbs.  Document Released: 05/02/2004 Document Revised: 04/17/2011 Document Reviewed: 05/02/2004 [facility] Patient Information 2012 [place], [place].     Manic Depressive Illness Manic depressive illness (manic depression) is called a bipolar disorder because patients with this illness have both ends of the range of feelings. They may feel as though they are in a deep hole during the depression phase and feel unable to get out of what they believe is a hopeless situation. During the manic phase they feel as though they are full of energy and can accomplish anything with their boundless energy. Many lives are ruined by this disease; and without effective treatment, the illness is connected with an increased risk of suicide. Bipolar disorder is a serious brain disease that causes extreme shifts in mood, energy, and functioning. It affects about 2.3 million adult Americans. This is about 1.2 percent of the population. Men and women are equally likely to develop this illness. The disorder usually starts in adolescence or early adulthood, but can start in childhood. This illness may be passed from your parents but the gene causing this illness has not been found. Cycles, or episodes, of depression, mania,  or "mixed" manic and depressive symptoms often recur (come back) and may become more frequent. This illness can disrupt work, school, family, and social life. It is important to give your caregiver a complete picture of what has been happening. Help is often looked for during the depression phase. If treatment for depression is started, some of the antidepressant medications can actually make things worse. Antidepressants can trigger mania with a worsening of the illness. There are a number of medications which work well with this disease and your caregiver can help you find the medication or combination  of medications which will work best for you.  SYMPTOMS  MANIA Abnormally and persistently elevated (high) mood or irritability and aggressiveness, accompanied by at least three of the following symptoms:  Overly-inflated self-esteem (You think a lot of yourself like a show-off)   Decreased need for sleep (You feel so full of energy that it seems as if you do not need sleep)   Increased talkativeness   Racing thoughts (Your ideas and thoughts may jump from one to the other in an endless stream)   Distractibility (It is difficult to keep your mind on one subject.)   Increased goal-directed activity such as shopping   Physical agitation (You may find it difficult to sit still)   Excessive involvement in risky or reckless behaviors or activities, such as spending sprees, poor business decisions, and sexual indiscretions   Poor judgment and decision making. (Your decisions are not normal or sensible)   Impulsiveness (You react quickly in an instant without thinking things through)  DEPRESSION Symptoms include:  Loss of interest or pleasure in activities that were once enjoyed   Significant change in appetite or body weight   Difficulty sleeping, or oversleeping   Physical slowing or agitation   Loss of energy   Feelings of worthlessness or inappropriate guilt   Difficulty thinking or concentrating; poor decision making abilities   Feelings of inadequacy and low self esteem (You may feel as though you are worth nothing)   Prolonged periods of sadness without apparent cause   Unexplained crying spells   Withdrawal from usual friends and family (You may spend more time alone)   Recurrent thoughts of death and suicide  MIXED STATE Symptoms of mania and depression are present at the same time. The symptom picture frequently includes:  Agitation   Trouble sleeping   Significant change in appetite   Psychosis   Suicidal thinking  BIPOLAR DISORDER WITH RAPID  RECYCLING Especially early in the course of illness, the episodes may be separated by periods of wellness during which a person suffers few to no symptoms. When four or more episodes of illness occur within a 67-month period, the person is said to have bipolar disorder with rapid cycling. Bipolar disorder is often complicated by co-occurring alcohol or substance abuse. PSYCHOSIS Severe depression or mania may be accompanied by symptoms of psychosis. These symptoms include: hallucinations (hearing, seeing, or otherwise sensing the presence of stimuli that are not there) and delusions (false personal beliefs that are not subject to reason or contradictory evidence, and are not explained by a person's cultural concepts). Psychotic symptoms associated with bipolar typically reflect the extreme mood state at the time. TREATMENT  Many of the above problems sound awful. The good news is that if you work with your caregivers and let them know what is wrong, they can usually help you.  A variety of medications are used to treat bipolar disorder, but even with the best  medication treatment, many people with the illness have some residual (left over) symptoms. Certain types of psychotherapy or psychosocial interventions, in combination with medication, often can provide additional benefits. These include cognitive-behavioral therapy, interpersonal and social rhythm therapy, family therapy, and psychoeducation. Your caregiver can explain these therapies to you.   Lithium has long been used as a first-line treatment for bipolar disorder. It has been an effective mood-stabilizing medication for many people with bipolar disorder.   Some anticonvulsant medications have been used as alternatives to lithium in many cases. Some research suggests that different combinations of lithium and anticonvulsants may be helpful.   According to studies conducted in [place] in patients with epilepsy, valproate may increase  testosterone levels in teenage girls and produce polycystic ovary syndrome in women who began taking the medication before age 66. Increased testosterone can lead to polycystic ovary syndrome with irregular or absent menses (menstrual periods), obesity (being very overweight), and abnormal growth of hair. Therefore, young female patients taking valproate should be watched carefully by a physician.   During a depressive episode, people with bipolar disorder commonly require additional treatment with antidepressant medication. Typically, lithium or anticonvulsant mood stabilizers are prescribed along with an antidepressant to protect against a switch into mania or rapid cycling. In some cases, the newer, atypical antipsychotic drugs may help relieve severe symptoms of bipolar disorder and prevent the return of mania. More research is needed to establish the safety and efficacy of atypical antipsychotics as long-term treatments for this disorder.  Document Released: 11/01/2003 Document Revised: 04/17/2011 Document Reviewed: 08/05/2005 [facility] Patient Information 2012 [place], [place].

## 2011-07-11 LAB — BASIC METABOLIC PANEL
BUN: 14 mg/dL (ref 6–23)
Chloride: 104 mEq/L (ref 96–112)
Glucose, Bld: 85 mg/dL (ref 70–99)
Potassium: 3.9 mEq/L (ref 3.5–5.3)

## 2011-07-13 NOTE — Assessment & Plan Note (Addendum)
WIll start pt on lithium as pt is willing to start this for bipolar disorder. Pt has been relatively reliable for follow up thus far. Pt instructed to start medication week following thanksgiving break as to insure adequate serum testing to assess appropriate lithium levels. Red flags discussed. Will follow up in 2-4 weeks with plan to refer pt back to Dr. Pascal Lux in the next 1-2 months. Once pt has insurance, will also likely switch bipolar meds.

## 2011-07-13 NOTE — Progress Notes (Signed)
  Subjective:    Patient ID: Sandra Briggs, female    DOB: 1976/01/19, 35 y.o.   MRN: [id number]  HPI Pt is here for follow up of radicular neck pain.  Pt states that neurontin has helped modulate pain. No weakness, atrophy or paresthesias per pt. Pt has been using prn vicodin 1-2 times per day to help with breakthrough pain.   Mood: Pt has baseline hx/o bipolar d/o that has not been treated. Pt states that has been overall stable since last clinical visit. Pt does report + pressured speech, insomnia, as well as racing thoughts. No HI/SI. Medications such as lamictal and lithium were discussed previously with pt declining on lithium (lamictal not an option as pt is currently uninsured, though is pending insurance in the next 3-6 months). Pt states that she is now willing to try lithium for mood management.    Review of Systems See HPI     Objective:   Physical Exam Gen: up in chair, NAD  HEENT: NCAT, EOMI, TMs clear bilaterally, + spurlings bilaterally  CV: RRR, no murmurs auscultated  PULM: CTAB, no wheezes, rales, rhoncii   Assessment & Plan:

## 2011-07-13 NOTE — Assessment & Plan Note (Signed)
Will increase neurontin to 300 TID. Vicodin refill given. Will otherwise follow up in 2-4 weeks.

## 2011-07-17 ENCOUNTER — Other Ambulatory Visit: Payer: Self-pay

## 2011-07-18 ENCOUNTER — Telehealth: Payer: Self-pay | Admitting: Family Medicine

## 2011-07-18 NOTE — Telephone Encounter (Signed)
Sandra Briggs hasn't taken the med she was prescribed for the lithium since Monday due to having stomach virus and still having episodes of n/v.  Want to know if she should start it on Monday and then have lab after resonable amount of time meds is in her system.  Please call to advise.

## 2011-07-19 ENCOUNTER — Other Ambulatory Visit: Payer: Self-pay

## 2011-07-22 NOTE — Telephone Encounter (Signed)
Called to follow up the patient about lithium. Patient states she had a severe viral gastroenteritis starting approximately Wednesday of last week and had persisted up until about Saturday. Patient was concerned about starting lithium as she was having active vomiting and diarrhea. Patient states this is now resolved. Patient is concerned about taking lithium given GI losses. Instructed patient that she can resume lithium when she feels that she can tolerate this. Instructed patient to take with him today and she feels relatively asymptomatic. However, instructed patient that if lithium causes vomiting, to hold off on resuming lithium for another week.

## 2011-08-08 ENCOUNTER — Ambulatory Visit (INDEPENDENT_AMBULATORY_CARE_PROVIDER_SITE_OTHER): Payer: Self-pay | Admitting: Family Medicine

## 2011-08-08 VITALS — BP 137/89 | HR 102 | Temp 98.7°F | Ht 64.0 in | Wt 166.0 lb

## 2011-08-08 DIAGNOSIS — M5380 Other specified dorsopathies, site unspecified: Secondary | ICD-10-CM

## 2011-08-08 DIAGNOSIS — M62830 Muscle spasm of back: Secondary | ICD-10-CM

## 2011-08-08 DIAGNOSIS — F319 Bipolar disorder, unspecified: Secondary | ICD-10-CM

## 2011-08-08 DIAGNOSIS — M542 Cervicalgia: Secondary | ICD-10-CM

## 2011-08-08 DIAGNOSIS — F410 Panic disorder [episodic paroxysmal anxiety] without agoraphobia: Secondary | ICD-10-CM

## 2011-08-08 DIAGNOSIS — IMO0002 Reserved for concepts with insufficient information to code with codable children: Secondary | ICD-10-CM

## 2011-08-08 DIAGNOSIS — M792 Neuralgia and neuritis, unspecified: Secondary | ICD-10-CM

## 2011-08-08 MED ORDER — ALPRAZOLAM 2 MG PO TABS: ORAL_TABLET | ORAL | Status: DC

## 2011-08-08 MED ORDER — HYDROCODONE-ACETAMINOPHEN 5-500 MG PO TABS: 1.0000 | ORAL_TABLET | Freq: Two times a day (BID) | ORAL | Status: DC | PRN

## 2011-08-08 MED ORDER — KETOROLAC TROMETHAMINE 60 MG/2ML IM SOLN
60.0000 mg | Freq: Once | INTRAMUSCULAR | Status: AC
  Administered 2011-08-08: 60 mg via INTRAMUSCULAR

## 2011-08-08 MED ORDER — CYCLOBENZAPRINE HCL 5 MG PO TABS: 5.0000 mg | ORAL_TABLET | Freq: Three times a day (TID) | ORAL | Status: DC | PRN

## 2011-08-08 NOTE — Assessment & Plan Note (Signed)
See muscle spasm assessment.
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
## 2011-08-08 NOTE — Patient Instructions (Signed)
Muscle Strain A muscle strain, or pulled muscle, occurs when a muscle is over-stretched. A small number of muscle fibers may also be torn. This is especially common in athletes. This happens when a sudden violent force placed on a muscle pushes it past its capacity. Usually, recovery from a pulled muscle takes 1 to 2 weeks. But complete healing will take 5 to 6 weeks. There are millions of muscle fibers. Following injury, your body will usually return to normal quickly. HOME CARE INSTRUCTIONS   While awake, apply ice to the sore muscle for 15 to 20 minutes each hour for the first 2 days. Put ice in a plastic bag and place a towel between the bag of ice and your skin.   Do not use the pulled muscle for several days. Do not use the muscle if you have pain.   You may wrap the injured area with an elastic bandage for comfort. Be careful not to bind it too tightly. This may interfere with blood circulation.   Only take over-the-counter or prescription medicines for pain, discomfort, or fever as directed by your caregiver. Do not use aspirin as this will increase bleeding (bruising) at injury site.   Warming up before exercise helps prevent muscle strains.  SEEK MEDICAL CARE IF:  There is increased pain or swelling in the affected area. MAKE SURE YOU:   Understand these instructions.   Will watch your condition.   Will get help right away if you are not doing well or get worse.  Document Released: 08/05/2005 Document Revised: 04/17/2011 Document Reviewed: 03/04/2007 [employer] Patient Information 2012 [employer]. 
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
## 2011-09-24 ENCOUNTER — Telehealth: Payer: Self-pay | Admitting: Family Medicine

## 2011-09-24 NOTE — Telephone Encounter (Signed)
Attempted to call pt back about medications. Was unable to leave VM. Pt will need to come in for appt for meds.

## 2011-09-24 NOTE — Telephone Encounter (Signed)
Patient is calling because her Pharmacy has sent refill requests for Hydrocodone and Alprazolam twice but has not heard back yet.  The first time was yesterday and the 2nd today.  They are in Dr. Mauro Kaufmann box.

## 2011-09-27 ENCOUNTER — Ambulatory Visit: Payer: Self-pay | Admitting: Family Medicine

## 2011-10-02 ENCOUNTER — Other Ambulatory Visit: Payer: Self-pay | Admitting: Family Medicine

## 2011-10-02 DIAGNOSIS — M792 Neuralgia and neuritis, unspecified: Secondary | ICD-10-CM

## 2011-10-02 MED ORDER — ALPRAZOLAM 2 MG PO TABS: ORAL_TABLET | ORAL | Status: DC

## 2011-10-02 MED ORDER — HYDROCODONE-ACETAMINOPHEN 5-500 MG PO TABS: 1.0000 | ORAL_TABLET | Freq: Two times a day (BID) | ORAL | Status: DC | PRN

## 2011-10-08 ENCOUNTER — Encounter: Payer: Self-pay | Admitting: Family Medicine

## 2011-10-08 ENCOUNTER — Ambulatory Visit (INDEPENDENT_AMBULATORY_CARE_PROVIDER_SITE_OTHER): Payer: Self-pay | Admitting: Family Medicine

## 2011-10-08 VITALS — BP 129/83 | HR 112 | Ht 64.0 in | Wt 171.0 lb

## 2011-10-08 DIAGNOSIS — F319 Bipolar disorder, unspecified: Secondary | ICD-10-CM

## 2011-10-08 DIAGNOSIS — F410 Panic disorder [episodic paroxysmal anxiety] without agoraphobia: Secondary | ICD-10-CM

## 2011-10-08 DIAGNOSIS — M542 Cervicalgia: Secondary | ICD-10-CM

## 2011-10-08 MED ORDER — LAMOTRIGINE 25 MG PO TABS: ORAL_TABLET | ORAL | Status: DC

## 2011-10-08 NOTE — Patient Instructions (Signed)
He was good to see today Be sure to make an appointment to see me in 2 weeks Call with any questions God bless Doree Albee MD   Manic Depressive Illness Manic depressive illness (manic depression) is called a bipolar disorder because patients with this illness have both ends of the range of feelings. They may feel as though they are in a deep hole during the depression phase and feel unable to get out of what they believe is a hopeless situation. During the manic phase they feel as though they are full of energy and can accomplish anything with their boundless energy. Many lives are ruined by this disease; and without effective treatment, the illness is connected with an increased risk of suicide. Bipolar disorder is a serious brain disease that causes extreme shifts in mood, energy, and functioning. It affects about 2.3 million adult Americans. This is about 1.2 percent of the population. Men and women are equally likely to develop this illness. The disorder usually starts in adolescence or early adulthood, but can start in childhood. This illness may be passed from your parents but the gene causing this illness has not been found. Cycles, or episodes, of depression, mania, or "mixed" manic and depressive symptoms often recur (come back) and may become more frequent. This illness can disrupt work, school, family, and social life. It is important to give your caregiver a complete picture of what has been happening. Help is often looked for during the depression phase. If treatment for depression is started, some of the antidepressant medications can actually make things worse. Antidepressants can trigger mania with a worsening of the illness. There are a number of medications which work well with this disease and your caregiver can help you find the medication or combination of medications which will work best for you.  SYMPTOMS  MANIA Abnormally and persistently elevated (high) mood or irritability and  aggressiveness, accompanied by at least three of the following symptoms:  Overly-inflated self-esteem (You think a lot of yourself like a show-off)   Decreased need for sleep (You feel so full of energy that it seems as if you do not need sleep)   Increased talkativeness   Racing thoughts (Your ideas and thoughts may jump from one to the other in an endless stream)   Distractibility (It is difficult to keep your mind on one subject.)   Increased goal-directed activity such as shopping   Physical agitation (You may find it difficult to sit still)   Excessive involvement in risky or reckless behaviors or activities, such as spending sprees, poor business decisions, and sexual indiscretions   Poor judgment and decision making. (Your decisions are not normal or sensible)   Impulsiveness (You react quickly in an instant without thinking things through)  DEPRESSION Symptoms include:  Loss of interest or pleasure in activities that were once enjoyed   Significant change in appetite or body weight   Difficulty sleeping, or oversleeping   Physical slowing or agitation   Loss of energy   Feelings of worthlessness or inappropriate guilt   Difficulty thinking or concentrating; poor decision making abilities   Feelings of inadequacy and low self esteem (You may feel as though you are worth nothing)   Prolonged periods of sadness without apparent cause   Unexplained crying spells   Withdrawal from usual friends and family (You may spend more time alone)   Recurrent thoughts of death and suicide  MIXED STATE Symptoms of mania and depression are present at the same  time. The symptom picture frequently includes:  Agitation   Trouble sleeping   Significant change in appetite   Psychosis   Suicidal thinking  BIPOLAR DISORDER WITH RAPID RECYCLING Especially early in the course of illness, the episodes may be separated by periods of wellness during which a person suffers few to  no symptoms. When four or more episodes of illness occur within a 47-month period, the person is said to have bipolar disorder with rapid cycling. Bipolar disorder is often complicated by co-occurring alcohol or substance abuse. PSYCHOSIS Severe depression or mania may be accompanied by symptoms of psychosis. These symptoms include: hallucinations (hearing, seeing, or otherwise sensing the presence of stimuli that are not there) and delusions (false personal beliefs that are not subject to reason or contradictory evidence, and are not explained by a person's cultural concepts). Psychotic symptoms associated with bipolar typically reflect the extreme mood state at the time. TREATMENT  Many of the above problems sound awful. The good news is that if you work with your caregivers and let them know what is wrong, they can usually help you.  A variety of medications are used to treat bipolar disorder, but even with the best medication treatment, many people with the illness have some residual (left over) symptoms. Certain types of psychotherapy or psychosocial interventions, in combination with medication, often can provide additional benefits. These include cognitive-behavioral therapy, interpersonal and social rhythm therapy, family therapy, and psychoeducation. Your caregiver can explain these therapies to you.   Lithium has long been used as a first-line treatment for bipolar disorder. It has been an effective mood-stabilizing medication for many people with bipolar disorder.   Some anticonvulsant medications have been used as alternatives to lithium in many cases. Some research suggests that different combinations of lithium and anticonvulsants may be helpful.   According to studies conducted in [place] in patients with epilepsy, valproate may increase testosterone levels in teenage girls and produce polycystic ovary syndrome in women who began taking the medication before age 53. Increased testosterone  can lead to polycystic ovary syndrome with irregular or absent menses (menstrual periods), obesity (being very overweight), and abnormal growth of hair. Therefore, young female patients taking valproate should be watched carefully by a physician.   During a depressive episode, people with bipolar disorder commonly require additional treatment with antidepressant medication. Typically, lithium or anticonvulsant mood stabilizers are prescribed along with an antidepressant to protect against a switch into mania or rapid cycling. In some cases, the newer, atypical antipsychotic drugs may help relieve severe symptoms of bipolar disorder and prevent the return of mania. More research is needed to establish the safety and efficacy of atypical antipsychotics as long-term treatments for this disorder.  Document Released: 11/01/2003 Document Revised: 04/17/2011 Document Reviewed: 08/05/2005 [facility] Patient Information 2012 [place], [place].

## 2011-10-08 NOTE — Progress Notes (Signed)
  Subjective:    Patient ID: Sandra Briggs, female    DOB: 1975-10-10, 36 y.o.   MRN: [id number]  HPI  Patient presents today for general mood followup. Patient is known to have a baseline history of bipolar disorder that patient was initially placed on regimen of lithium. Patient states that she has not been able to continue or start on this regimen secondary to intermittent GI infections. Overall mood has been unchanged. No homicidal or suicidal ideations. Sleep has been somewhat improved since pt started taking neurontin for radicular neck pain per pt. Anxiety has seemed to flare over the last 1-2 months where pt needs to use her xanax on a more frequent basis per pt. Xanax originally prescribed by previous PCP >2 year ago.   Neck Pain: Pt with previous hx/o radicular neck pain. 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 Pt has been using prn vicodin for this as well as neurontin. Pt states that pain has been overall well controlled. Radicular sxs that previously affected mainly L arm has markedly improved s/p starting neurontin.     Review of Systems See HPI, otherwise ROS negative.     Objective:   Physical Exam Gen: up in chair, NAD HEENT: NCAT, EOMI, TMs clear bilaterally, mild paratrapezius neck pain, spurlings positive bilaterally CV: RRR, no murmurs auscultated PULM: CTAB, no wheezes, rales, rhoncii ABD: S/NT/+ bowel sounds  EXT: 2+ peripheral pulses         Assessment & Plan:

## 2011-10-09 NOTE — Assessment & Plan Note (Addendum)
Currently stable. Will continue on current regimen. vicodin #30 today. No refills.

## 2011-10-09 NOTE — Assessment & Plan Note (Signed)
Xanax refill given x 1 today as pt is being bridged to bipolar disorder treatment. Discussed plan to transition off this medication in coming months to longer acting benzo vs. Marked decrease as bipolar disorder is properly treated.

## 2011-10-09 NOTE — Assessment & Plan Note (Signed)
Currently not medically controlled. Will start pt on lamictal for management, especially given recent flare of anxiety. Discussd derm red flags with medication. PHQ-9 score 2, MDQ score with 9 yes answers. WIll follow up in 2-3 weeks.

## 2011-10-22 ENCOUNTER — Ambulatory Visit (INDEPENDENT_AMBULATORY_CARE_PROVIDER_SITE_OTHER): Payer: Self-pay | Admitting: Family Medicine

## 2011-10-22 ENCOUNTER — Telehealth: Payer: Self-pay | Admitting: Psychology

## 2011-10-22 ENCOUNTER — Encounter: Payer: Self-pay | Admitting: Family Medicine

## 2011-10-22 VITALS — BP 150/90 | HR 103 | Temp 97.2°F | Ht 64.0 in | Wt 174.5 lb

## 2011-10-22 DIAGNOSIS — F319 Bipolar disorder, unspecified: Secondary | ICD-10-CM

## 2011-10-22 DIAGNOSIS — R109 Unspecified abdominal pain: Secondary | ICD-10-CM

## 2011-10-22 DIAGNOSIS — R300 Dysuria: Secondary | ICD-10-CM

## 2011-10-22 LAB — POCT URINALYSIS DIPSTICK
Bilirubin, UA: NEGATIVE
Ketones, UA: NEGATIVE
Leukocytes, UA: NEGATIVE
Leukocytes, UA: NEGATIVE
Nitrite, UA: NEGATIVE
Protein, UA: NEGATIVE
pH, UA: 6.5
pH, UA: 7

## 2011-10-22 LAB — POCT UA - MICROSCOPIC ONLY: RBC, urine, microscopic: >20

## 2011-10-22 MED ORDER — DOXAZOSIN MESYLATE 1 MG PO TABS: 1.0000 mg | ORAL_TABLET | Freq: Every day | ORAL | Status: DC

## 2011-10-22 MED ORDER — KETOROLAC TROMETHAMINE 60 MG/2ML IM SOLN
60.0000 mg | Freq: Once | INTRAMUSCULAR | Status: AC
  Administered 2011-10-22: 60 mg via INTRAMUSCULAR

## 2011-10-22 NOTE — Assessment & Plan Note (Signed)
Mood stable. PHQ-9 score 2, MDQ score 7. Will increased lamictal to 50mg  daily. Will follow up in 1 month.

## 2011-10-22 NOTE — Progress Notes (Signed)
  Subjective:    Patient ID: Sandra Briggs, female    DOB: 1976/02/16, 36 y.o.   MRN: [id number]  HPI  DYSURIA Onset:  2 days Description: increased urinary frequency x 2 days. + R flank pain x 2 days. Dark colored urine. No dysuria.  Modifying factors: Pt states that she has a history of kidney stones that presented in a similar fashion.   Symptoms Urgency:  yes Frequency: yes  Hesitancy:  no Hematuria:  yes Flank Pain:  yes Fever: no Nausea/Vomiting:  no Missed LMP: >8 months ago STD exposure: no Discharge: no Irritants: none Rash: no  Red Flags   More than 3 UTI's last 12 months:  no PMH of  Diabetes or Immunosuppression:  no Renal Disease/Calculi: yes Urinary Tract Abnormality:  no Instrumentation or Trauma: no   Pt is also here for bipolar follow up. Pt was recently placed on lamictal for mood stabilization. This was chosen as pt is paying out of pocket for medications and medical expenses. Lithium previously tried, however could not tolerate.  Today, pt states that mood is overall stable. No significant worsening in mood, though pt states that she has had to use no xanax since starting lamictal. Pt denies rash.   Review of Systems See HPI, otherwise ROS negative    Objective:   Physical Exam Gen: in bed, NAD CV: RRR, no murmurs PULM: CTAB, no wheezes  ABD: + bowel sounds, + suprpubic tenderness, + R flank TTP       Assessment & Plan:

## 2011-10-22 NOTE — Telephone Encounter (Signed)
Sandra Briggs called to schedule a beh med appointment at the request or suggestion of her PCP.  She was taken off Depakote and tried on Lithium.  This upset her stomach.  Currently on Lamictal and Xanax.  Doesn't use Xanax during the day because of work.  Says she is irritable - not depressed.  Thinks she needs therapy.  If she is unable to attend the appointment, she agreed she would call and stated an understanding that I would have difficulty rescheduling her in my clinic if she did not show.

## 2011-10-22 NOTE — Patient Instructions (Signed)
It was good to see today I am sending you for an ultrasound of the kidney to evaluate kidney stones Be sure to take ibuprofen 800 mg for pain Am also starting you on medication to help you pass a potential kidney stone I'm going to reevaluate your urine. If it is indicative length of infection I will call you. If he develops any fever, nausea, vomiting please call us or go to emergency room Come back to see me one month for your bipolar disorder Call if any questions God Bless,  Doree Albee MD

## 2011-10-22 NOTE — Assessment & Plan Note (Signed)
+  R flank pain and gross hematuria on UA. Overall exam consistent with kidney stone. WIll obtain renal U/S to assess (would like to avoid CT as pt has had upward of 10 CT scans of abdomen in the past). Toradol 60mg  x 1 today. Scheduled NSAIDs. Cardura to assist in stone passage. Pt has strainer at home. High water intake. No signs of UTI on UA. Discussed infectious red flags at length with pt.  Case precepted with Dr. Sheffield Slider.

## 2011-10-23 ENCOUNTER — Other Ambulatory Visit (HOSPITAL_COMMUNITY): Payer: Self-pay

## 2011-11-04 ENCOUNTER — Ambulatory Visit (INDEPENDENT_AMBULATORY_CARE_PROVIDER_SITE_OTHER): Payer: Self-pay | Admitting: Psychology

## 2011-11-04 DIAGNOSIS — F319 Bipolar disorder, unspecified: Secondary | ICD-10-CM

## 2011-11-04 NOTE — Progress Notes (Signed)
Sandra Briggs presents after a three year hiatus from therapy.  She had missed four+ appointments.  We discussed it and she did not follow-up.  She recalls now how stressful things were and how she wasn't managing them well.  Got an update today about all that has changed including the following:  Works full-time as an Print production planner for a [employer]  Not her calling in life but she is good at it and it pays the bills.  Going to school full-time for radiology but does not think this is it.  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  Is not interested in cars but could envision herself managing an office where the business was something else.    Dating Sandra Briggs for two+ years.  He lives with her in her house.  He has five children, three of whom are young and a part of their weekly lives along with her three children.  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  She describes him as funny, smart, "has a job" and "doesn't yell."  She says their is a considerable decrease in drama in her life secondary to leaving her husband and entering this relationship and that she is managing this fine.  She experienced the death of the two women who helped shape her (along with her mother) - her Sandra Briggs (I think) and her mom's mom.  This has been hard.  She considers the death of her mother to be the reason she continues to experience so much anxiety.  She reports struggling with various types of anxiety including panic attacks and more generalized anxiety.  With regards to panic, they used to occur 2 times a week but she reports a decrease to 4 times a month.  She says often times there is a sort of prodrome and when she is at home, she can often distract herself or prevent a full attack.  When at work, she finds this much more challenging to do and so she takes a 1/4 of her 2 mg Xanax and usually does pretty well.  Symptoms include a  tightening / tension in her jaw, SOB, racing (catastrophic) thoughts, and tingling in her arms.  More generalized anxiety is fear over something bad happening to her children or her sisters.  She will see something on television and then extrapolate it to one of her loved ones to a nearly obsessive degree.  She gave an example about sister Sandra Briggs who is a Sales executive.  When there were reports of tornadoes in the area, she could not think of anything else until she was able to speak to [name]  She is attracted to true crime stories and recognizes that when children are involved, it isn't safe for her psyche.  She has recognized the news is often that way too.  Reviewed medications she is currently on:  Lamictal and Xanax.  She did well on Depakote but couldn't afford it.  She did not do well on Lithium (diarrhea) and Seroquel (too sleepy).

## 2011-11-04 NOTE — Patient Instructions (Addendum)
Please schedule a follow up for:  April 2nd at 10:00. Sounds like anxiety or fear about catastrophic things are weighing heavily on you.  I would like for you to keep a record.  I want to know frequency, intensity and duration.  I really want a gauge of how much this is impacting your function.  Look at your job function, mom function and other relationship function (e.g. Is Tawanna Cooler really struggling at being around you when you are this anxious, are you irritable with your kids because of it or creating anxiety in them).

## 2011-11-04 NOTE — Assessment & Plan Note (Signed)
Report of mood is euthymic with some frequent anxiety.  Affect is within normal limits with the exception of some marked irritability related to her ex-husband and his mother.  Thoughts are clear and goal directed.  She seems to be in a healthier place than she was three years ago in terms of work, finances and school.  See patient instructions for plan.  I asked her to do some homework on the price of Depakote at [facility] if she is interested in going back on this medicine.

## 2011-11-19 ENCOUNTER — Telehealth: Payer: Self-pay | Admitting: Family Medicine

## 2011-11-19 ENCOUNTER — Ambulatory Visit (INDEPENDENT_AMBULATORY_CARE_PROVIDER_SITE_OTHER): Payer: Self-pay | Admitting: Family Medicine

## 2011-11-19 ENCOUNTER — Ambulatory Visit (INDEPENDENT_AMBULATORY_CARE_PROVIDER_SITE_OTHER): Payer: Self-pay | Admitting: Psychology

## 2011-11-19 ENCOUNTER — Encounter: Payer: Self-pay | Admitting: Family Medicine

## 2011-11-19 ENCOUNTER — Other Ambulatory Visit: Payer: Self-pay | Admitting: Family Medicine

## 2011-11-19 VITALS — BP 129/86 | HR 110 | Ht 64.0 in | Wt 176.5 lb

## 2011-11-19 DIAGNOSIS — R634 Abnormal weight loss: Secondary | ICD-10-CM

## 2011-11-19 DIAGNOSIS — M542 Cervicalgia: Secondary | ICD-10-CM

## 2011-11-19 DIAGNOSIS — F319 Bipolar disorder, unspecified: Secondary | ICD-10-CM

## 2011-11-19 DIAGNOSIS — B370 Candidal stomatitis: Secondary | ICD-10-CM

## 2011-11-19 DIAGNOSIS — M792 Neuralgia and neuritis, unspecified: Secondary | ICD-10-CM

## 2011-11-19 DIAGNOSIS — IMO0002 Reserved for concepts with insufficient information to code with codable children: Secondary | ICD-10-CM

## 2011-11-19 MED ORDER — HYDROCODONE-ACETAMINOPHEN 5-500 MG PO TABS: 1.0000 | ORAL_TABLET | Freq: Two times a day (BID) | ORAL | Status: DC | PRN

## 2011-11-19 MED ORDER — ALPRAZOLAM 2 MG PO TABS: ORAL_TABLET | ORAL | Status: DC

## 2011-11-19 MED ORDER — GABAPENTIN 400 MG PO CAPS: ORAL_CAPSULE | ORAL | Status: DC

## 2011-11-19 MED ORDER — NYSTATIN 100000 UNIT/ML MT SUSP: 500000.0000 [IU] | Freq: Four times a day (QID) | OROMUCOSAL | Status: AC

## 2011-11-19 MED ORDER — LAMOTRIGINE 100 MG PO TABS: 100.0000 mg | ORAL_TABLET | Freq: Every day | ORAL | Status: DC

## 2011-11-19 NOTE — Assessment & Plan Note (Addendum)
Clinically improved with lamictal. PHQ-9 score of 2. MDQ score of 12 yes. Elevated MDQ score may be 2/2 baseline anxiety. Will continue with current regimen. Xanax refilled today. Will consider transition to klonopin if anxiety fails to improve uptitration of lamictal over time.  No clinical signs of SJS, however did discuss L ankle rash and continued surveillance.

## 2011-11-19 NOTE — Telephone Encounter (Signed)
Patient is calling because the Pharmacy did not know if she was to rinse and spit or rinse and swallow the Mycostatin.  She said she would go ahead and fill the Rx and rinse and spit with it until she heard back from Dr. Alvester Morin.

## 2011-11-19 NOTE — Progress Notes (Signed)
Sandra Briggs presents for follow-up.  She is feeling overwhelmed with some information she was given during her appointment with Dr. Alvester Morin.  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  She did not request a test for HIV today but now she is thinking it would be a good idea in order to manage her anxiety.  Discussed.    Spent the rest of the session talking about family relationships with the most significant one being with her mother.  Looked at her anxiety and how it is related.  She noted that occasionally during panic attacks, about the 30 minute mark, she stops fighting the anxiety and simply accepts the fact that she "might die."  When she does this, she notes the anxiety recedes.

## 2011-11-19 NOTE — Assessment & Plan Note (Signed)
Affect is very anxious about presentation.  She used deep breathing with moderate effect.  Shifting attention to family relationships helped the anxiety further as well as making a plan to address the situation (HIV test).  Introduced the concept of radical acceptance and specifically tied it to her experience of "letting go" during a panic attack.  Looked at changing her end view from "something bad might happen" to "it is going to be okay" even when (because it will) something bad happens.  The illusion of control, at least in part, fuels her anxiety.  Looking toward acceptance may help her live with less anxiety.  Gave hand out on this and will follow-up.  She is paying out of pocket.  I would like to see her weekly or every two weeks now because she seems motivated and is doing good work.  My schedule and the cost are barriers.  Scheduled for April 25th and we will discuss again then.    Tried to find Dr. Alvester Morin to see if he could place the order for HIV.  He was in clinic.  Talked with preceptor who thought it was a reasonable plan and placed the order.  Cost of test is $130.  Encouraged Sandra Briggs to go to the health department for free testing.

## 2011-11-19 NOTE — Patient Instructions (Signed)
It was good to see you today  I have increased your neurontin dose I have refilled: vicodin Xanax  Continue your lamictal at the current dose Come back in 6-8 weeks,  Call if any questions,  God Bless, Doree Albee MD

## 2011-11-19 NOTE — Assessment & Plan Note (Signed)
Will treat with nystatin. Will also check for HIV out of pt concern. Discussed red flags for reevaluation

## 2011-11-19 NOTE — Assessment & Plan Note (Addendum)
Increased neurontin to 400 in am and pm, 800 at night. vicodin 5/500 # 30 also refilled.

## 2011-11-19 NOTE — Assessment & Plan Note (Addendum)
BMI 30. Will formally refer to nutrition. Discussed active lifestyle.

## 2011-11-19 NOTE — Progress Notes (Deleted)
  Subjective:    Patient ID: Sandra Briggs, female    DOB: [birth year], 36 y.o.   MRN: [id number]  HPI    Review of Systems     Objective:   Physical Exam        Assessment & Plan:

## 2011-11-19 NOTE — Progress Notes (Signed)
S:  Pt presents today for chronic problem follow up:  Mood: Pt with a baseline hx/o bipolar d/o that has been followed here at the [facility] by myself and Dr. Pascal Lux. Pt is currently on lamictal for management of this as well as MDC with Dr. Pascal Lux. Pt has also been on prn xanax for anxiety sxs. Pt has been on lamictal since 09/2011. Pt states that mood has been overall well controlled since starting medication. Pt denies any significant rash apart from small area over ankle which occurred on contralateral ankle one year prior (prior to being on lamictal). Area of rash has remained stable over the last 1-2 weeks. Pt states that she does have some intermittent anxiety spells that have been controlled with prn xanax but pt feels that she has not had to use medication as much as before. Pt also feels that neurontin is helping with cervicalgia as well as anxiety.   Weigth loss: Pt states that she has noticed significant weight loss over the last 6 months of up to 20 pounds. Pt states that she has an irregular eating pattern as well as poor diet because of taking care of her children. Pt states that she is also not exercising regularly. Pt would like help with this.   Oral thrush: Pt reports oral thrush x 2-3 weeks. Pt states that she noticed after a URI recently. Pt denies any recent antibiotic or steroid use. Pt states that this has never happened before in the past.       O:  Current Outpatient Prescriptions  Medication Sig Dispense Refill  . alprazolam (XANAX) 2 MG tablet Take 1/4 tab every 6 hours as needed Take 1/4 tab every 6 hours as needed  30 tablet  0  . cyclobenzaprine (FLEXERIL) 5 MG tablet Take 1 tablet (5 mg total) by mouth 3 (three) times daily as needed. For spasm  30 tablet  0  . gabapentin (NEURONTIN) 400 MG capsule 1 tab in am and afternoon, 2 tabs at night  140 capsule  6  . HYDROcodone-acetaminophen (VICODIN) 5-500 MG per tablet Take 1 tablet by mouth 2 (two) times daily as needed.  30  tablet  0  . ibuprofen (ADVIL,MOTRIN) 600 MG tablet Take 600 mg by mouth as needed. for pain      . lamoTRIgine (LAMICTAL) 100 MG tablet Take 1 tablet (100 mg total) by mouth daily. 1 tab daily x 2 weeks, then 2 tabs daily x 2 weeks, then 4 tabs daily thereafter  60 tablet  6  . DISCONTD: alprazolam Prudy Feeler) 2 MG tablet Take 1/4 tab every 6 hours as needed Take 1/4 tab every 6 hours as needed  30 tablet  0  . DISCONTD: gabapentin (NEURONTIN) 300 MG capsule Take 1 capsule (300 mg total) by mouth 3 (three) times daily.  90 capsule  2  . DISCONTD: HYDROcodone-acetaminophen (VICODIN) 5-500 MG per tablet Take 1 tablet by mouth 2 (two) times daily as needed.  30 tablet  0  . DISCONTD: lamoTRIgine (LAMICTAL) 25 MG tablet 1 tab daily x 2 weeks, then 2 tabs daily x 2 weeks, then 4 tabs daily thereafter  120 tablet  3  . doxazosin (CARDURA) 1 MG tablet Take 1 tablet (1 mg total) by mouth at bedtime.  30 tablet  0  . famotidine (PEPCID) 20 MG tablet Take 20 mg by mouth daily as needed. For itching      . methylPREDNISolone (MEDROL, PAK,) 4 MG tablet Take by mouth. follow package directions       .  nystatin (MYCOSTATIN) 100000 UNIT/ML suspension Take 5 mLs (500,000 Units total) by mouth 4 (four) times daily.  60 mL  0  . triamcinolone (KENALOG) 0.1 % cream Apply topically 2 (two) times daily. To affected areas on body.  Do not apply to face         Wt Readings from Last 3 Encounters:  11/19/11 176 lb 8 oz (80.06 kg)  10/22/11 174 lb 8 oz (79.153 kg)  10/08/11 171 lb (77.565 kg)   Temp Readings from Last 3 Encounters:  10/22/11 97.2 F (36.2 C) Oral  08/08/11 98.7 F (37.1 C) Oral  01/18/11 98.4 F (36.9 C) Oral   BP Readings from Last 3 Encounters:  11/19/11 129/86  10/22/11 150/90  10/08/11 129/83   Pulse Readings from Last 3 Encounters:  11/19/11 110  10/22/11 103  10/08/11 112    General: alert and cooperative HEENT: PERRLA and extra ocular movement intact, oral thrush  Heart: S1, S2  normal, no murmur, rub or gallop, regular rate and rhythm Lungs: clear to auscultation, no wheezes or rales and unlabored breathing Abdomen: abdomen is soft without significant tenderness, masses, organomegaly or guarding Extremities: extremities normal, atraumatic, no cyanosis or edema Skin:noted 2x3 cm erythematous area on L lat malleoulus, non tender, blanchable, non pruritic.  Neurology: normal without focal findings   A/P:

## 2011-11-21 NOTE — Telephone Encounter (Signed)
Fwd. To Dr.Newton for review .[name]  

## 2011-11-21 NOTE — Telephone Encounter (Signed)
Has this been addressed?

## 2011-11-25 NOTE — Telephone Encounter (Signed)
Spoke with pt about this on 11/21/11 (did not record 2/2 personal illness). Clarified meds. Also discussed lamictal assd rash RFs.

## 2011-12-12 ENCOUNTER — Ambulatory Visit: Payer: Self-pay | Admitting: Psychology

## 2011-12-12 ENCOUNTER — Telehealth: Payer: Self-pay | Admitting: Psychology

## 2011-12-12 NOTE — Telephone Encounter (Signed)
Sandra Briggs called before her appointment to report a flat tire.  She wished to reschedule.  Unfortunately, next first available is not until May 16th.  We scheduled for 3:00.

## 2011-12-26 ENCOUNTER — Ambulatory Visit (INDEPENDENT_AMBULATORY_CARE_PROVIDER_SITE_OTHER): Payer: Self-pay | Admitting: Family Medicine

## 2011-12-26 ENCOUNTER — Encounter: Payer: Self-pay | Admitting: Family Medicine

## 2011-12-26 VITALS — BP 123/85 | HR 108 | Ht 64.0 in | Wt 175.0 lb

## 2011-12-26 DIAGNOSIS — IMO0002 Reserved for concepts with insufficient information to code with codable children: Secondary | ICD-10-CM

## 2011-12-26 DIAGNOSIS — M542 Cervicalgia: Secondary | ICD-10-CM

## 2011-12-26 DIAGNOSIS — M792 Neuralgia and neuritis, unspecified: Secondary | ICD-10-CM

## 2011-12-26 DIAGNOSIS — F319 Bipolar disorder, unspecified: Secondary | ICD-10-CM

## 2011-12-26 MED ORDER — LAMOTRIGINE 200 MG PO TABS: 200.0000 mg | ORAL_TABLET | Freq: Every day | ORAL | Status: DC

## 2011-12-26 MED ORDER — HYDROCODONE-ACETAMINOPHEN 5-500 MG PO TABS: 1.0000 | ORAL_TABLET | Freq: Two times a day (BID) | ORAL | Status: DC | PRN

## 2011-12-26 MED ORDER — ALPRAZOLAM 2 MG PO TABS: ORAL_TABLET | ORAL | Status: DC

## 2011-12-26 NOTE — Patient Instructions (Signed)
It was good to see today We are not going to make any medication changes Followup with me in one month Call if any questions

## 2011-12-27 NOTE — Progress Notes (Signed)
  Subjective:    Patient ID: [name], female    DOB: 07-23-1976, 36 y.o.   MRN: [id number]  HPI Pt here for mood follow up: Bipolar disorder: Overall stable. Feels that her mood is much more stable since being on lamictal. Has had some anxiety episodes, though these are much improved. Still has to use intermittent xanax.  Cervicalgia:  Chronic issue. 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 Has been on neurontin and prn vicodin for this. Has had to use vicodin for pain intermittently. Mostly needs at work when typing with head lean forward for prolonged periods of time. Neurontin has been very effective in helping manage radicular sxs.      Review of Systems See HPI, otherwise ROS negative.     Objective:   Physical Exam Gen: up in chair, NAD HEENT: NCAT, EOMI, TMs clear bilaterally CV: RRR, no murmurs auscultated PULM: CTAB, no wheezes, rales, rhoncii ABD: S/NT/+ bowel sounds  EXT: 2+ peripheral pulses    Assessment & Plan:

## 2011-12-30 NOTE — Assessment & Plan Note (Signed)
Stable on lamictal. Will continue on this current regimen.

## 2011-12-30 NOTE — Assessment & Plan Note (Addendum)
Stable on current regimen of neurontin and prn vicodin. vicodin 5/500 #30 refilled today. Next step would be increase in neurontin.

## 2012-01-02 ENCOUNTER — Ambulatory Visit (INDEPENDENT_AMBULATORY_CARE_PROVIDER_SITE_OTHER): Payer: Self-pay | Admitting: Psychology

## 2012-01-02 DIAGNOSIS — F319 Bipolar disorder, unspecified: Secondary | ICD-10-CM

## 2012-01-02 NOTE — Patient Instructions (Addendum)
Please schedule a follow-up appointment for May 28th at 3:00. Great job at already knowing things that will help you manage your mood. A couple of things to keep in mind:  Mood variation is normal.  You recognize that your mood might be swinging outside this range.  You named several things that might be helpful including reading, seeing the kids and keeping busy otherwise (on-line games).  One other thing to consider is forgiveness.  And acceptance.  You have Bipolar Disorder, however that is not who you are.  Sometimes you are going to manage it better.  Other times not so much.  Forgive.  Try again.  Move on.

## 2012-01-02 NOTE — Progress Notes (Signed)
Tona presents for follow-up.  She reports she awoke in a foul mood yesterday and it has persisted to today.  She expects it to last a few more days if it follows a pattern.  There were no obvious triggers.  Her dad called for the first time in 6 months about three days ago.  Her boyfriend noted her mood and she "went off" on her ex-husband today - something she successfully avoids doing when not in this space.  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  She reports she has an easier time reigning this in than this more depressive / irritable mood and that is further frustrating her.  She does believe she has a choice as to whether to act on the mood.  She notes a moment (albeit a short one) where she could choose differently but in this place, typically "goes off."    Coping today including taking the day off work (she had the time to do so and will not be penalized for it), watching a movie that helps her feel better, buying gardenia spray (a smell her mother used to use a lot) and generally keeping herself busy.  Looked at the situation with her ex and how she could learn from it.

## 2012-01-02 NOTE — Assessment & Plan Note (Signed)
Report of mood is depressed.  One and half day duration at present.  Typical pattern for her.  Her assessment is that it is outside the realm of normal variation.  Talked about whether she would want to get her medications tweaked to see if we could capture better mood control.  She would like to try to manage it cognitively and behaviorally. There does seem to be a window to do it differently.  See patient instructions for further plan.  Her insight is a definite strength when it comes to there therapy environment.

## 2012-01-14 ENCOUNTER — Ambulatory Visit: Payer: Self-pay | Admitting: Psychology

## 2012-01-29 ENCOUNTER — Ambulatory Visit: Payer: Self-pay | Admitting: Family Medicine

## 2012-02-17 ENCOUNTER — Telehealth: Payer: Self-pay | Admitting: Emergency Medicine

## 2012-02-17 ENCOUNTER — Encounter: Payer: Self-pay | Admitting: Family Medicine

## 2012-02-17 ENCOUNTER — Ambulatory Visit (INDEPENDENT_AMBULATORY_CARE_PROVIDER_SITE_OTHER): Payer: Self-pay | Admitting: Family Medicine

## 2012-02-17 VITALS — BP 123/84 | HR 79 | Temp 97.7°F | Ht 64.0 in | Wt 172.3 lb

## 2012-02-17 DIAGNOSIS — M792 Neuralgia and neuritis, unspecified: Secondary | ICD-10-CM

## 2012-02-17 DIAGNOSIS — J029 Acute pharyngitis, unspecified: Secondary | ICD-10-CM | POA: Insufficient documentation

## 2012-02-17 DIAGNOSIS — H109 Unspecified conjunctivitis: Secondary | ICD-10-CM | POA: Insufficient documentation

## 2012-02-17 MED ORDER — HYDROCODONE-ACETAMINOPHEN 5-500 MG PO TABS: 1.0000 | ORAL_TABLET | Freq: Two times a day (BID) | ORAL | Status: DC | PRN

## 2012-02-17 MED ORDER — ALPRAZOLAM 2 MG PO TABS: ORAL_TABLET | ORAL | Status: DC

## 2012-02-17 MED ORDER — GABAPENTIN 400 MG PO CAPS: ORAL_CAPSULE | ORAL | Status: DC

## 2012-02-17 MED ORDER — POLYMYXIN B-TRIMETHOPRIM 10000-0.1 UNIT/ML-% OP SOLN: 1.0000 [drp] | OPHTHALMIC | Status: DC

## 2012-02-17 NOTE — Telephone Encounter (Signed)
Called pt and informed. .Sandra Briggs  

## 2012-02-17 NOTE — Progress Notes (Signed)
  Subjective:    Patient ID: Sandra Briggs, female    DOB: 26-Jun-1976, 36 y.o.   MRN: [id number]  HPI 2 days of fever, cough, sore throat.  Fever 100.3- 101.  Non productive cough.  No headache, dyspnea, sneezing.  Woke up this morning with right eye crusting, sore.  Non contact lens wearer,  No trauma, no visual changes, no photosensetivity.    Review of Systemssee HPI     Objective:   Physical Exam GEN: Alert & Oriented, No acute distress HEENT: [place]/AT. EOMI, PERRLA, right eye conjunctival injection.  No scleral icterus.  Bilateral tympanic membranes intact without erythema or effusion.  .  Nares without edema or rhinorrhea.  Oropharynx is without exudates, mild erythema, no edema.  No anterior or posterior cervical lymphadenopathy.  CV:  Regular Rate & Rhythm, no murmur Respiratory:  Normal work of breathing, CTAB         Assessment & Plan:

## 2012-02-17 NOTE — Assessment & Plan Note (Signed)
Strep negative.  Discussed supportive care.

## 2012-02-17 NOTE — Telephone Encounter (Signed)
Fwd. To Dr.Booth for review. Lorenda Hatchet, Renato Battles

## 2012-02-17 NOTE — Assessment & Plan Note (Signed)
Likely viral,  Desires treatment,  Will rx polytrim drops, discussed normal course, self resolving in many cases.

## 2012-02-17 NOTE — Telephone Encounter (Signed)
Pt is out of the following meds and needs refills. Sandra Briggs has made a appt to meet new doc, Elwyn Reach on 7/11 @ 1:30p, but needs meds now.   Gabapentin (Cap)  ALPRAZolam (Tab)  Hydrocodone-Acetaminophen (Tab)

## 2012-02-17 NOTE — Telephone Encounter (Signed)
Resent gabapentin electronically.  Called in Xanax and Vicodin #10 tabs of each.  Will need to be seen prior to any additional refills.

## 2012-02-17 NOTE — Patient Instructions (Addendum)

## 2012-02-19 ENCOUNTER — Encounter: Payer: Self-pay | Admitting: Family Medicine

## 2012-02-19 ENCOUNTER — Encounter (HOSPITAL_BASED_OUTPATIENT_CLINIC_OR_DEPARTMENT_OTHER): Payer: Self-pay | Admitting: *Deleted

## 2012-02-19 ENCOUNTER — Ambulatory Visit (INDEPENDENT_AMBULATORY_CARE_PROVIDER_SITE_OTHER): Payer: Self-pay | Admitting: Family Medicine

## 2012-02-19 ENCOUNTER — Ambulatory Visit (HOSPITAL_COMMUNITY)
Admission: RE | Admit: 2012-02-19 | Discharge: 2012-02-19 | Disposition: A | Discharge status: Home / Self Care | Payer: Self-pay | Source: Ambulatory Visit | Attending: Family Medicine | Admitting: Family Medicine

## 2012-02-19 ENCOUNTER — Emergency Department (HOSPITAL_BASED_OUTPATIENT_CLINIC_OR_DEPARTMENT_OTHER)
Admission: EM | Admit: 2012-02-19 | Discharge: 2012-02-19 | Disposition: A | Discharge status: Home / Self Care | Payer: Self-pay | Attending: Emergency Medicine | Admitting: Emergency Medicine

## 2012-02-19 VITALS — BP 128/83 | HR 93 | Temp 98.5°F | Wt 172.0 lb

## 2012-02-19 DIAGNOSIS — H5710 Ocular pain, unspecified eye: Secondary | ICD-10-CM | POA: Insufficient documentation

## 2012-02-19 DIAGNOSIS — R259 Unspecified abnormal involuntary movements: Secondary | ICD-10-CM | POA: Insufficient documentation

## 2012-02-19 DIAGNOSIS — H109 Unspecified conjunctivitis: Secondary | ICD-10-CM

## 2012-02-19 DIAGNOSIS — R6884 Jaw pain: Secondary | ICD-10-CM

## 2012-02-19 DIAGNOSIS — Z87891 Personal history of nicotine dependence: Secondary | ICD-10-CM | POA: Insufficient documentation

## 2012-02-19 DIAGNOSIS — J029 Acute pharyngitis, unspecified: Secondary | ICD-10-CM | POA: Insufficient documentation

## 2012-02-19 DIAGNOSIS — G8929 Other chronic pain: Secondary | ICD-10-CM | POA: Insufficient documentation

## 2012-02-19 DIAGNOSIS — R252 Cramp and spasm: Secondary | ICD-10-CM

## 2012-02-19 DIAGNOSIS — R51 Headache: Secondary | ICD-10-CM | POA: Insufficient documentation

## 2012-02-19 MED ORDER — IOHEXOL 300 MG/ML  SOLN
100.0000 mL | Freq: Once | INTRAMUSCULAR | Status: AC | PRN
  Administered 2012-02-19: 100 mL via INTRAVENOUS

## 2012-02-19 MED ORDER — IBUPROFEN 800 MG PO TABS: 800.0000 mg | ORAL_TABLET | Freq: Three times a day (TID) | ORAL | Status: DC | PRN

## 2012-02-19 MED ORDER — FLUORESCEIN SODIUM 1 MG OP STRP
ORAL_STRIP | OPHTHALMIC | Status: AC
  Administered 2012-02-19: 1 via OPHTHALMIC
  Filled 2012-02-19: qty 1

## 2012-02-19 MED ORDER — KETOROLAC TROMETHAMINE 60 MG/2ML IM SOLN
60.0000 mg | Freq: Once | INTRAMUSCULAR | Status: AC
  Administered 2012-02-19: 60 mg via INTRAMUSCULAR

## 2012-02-19 MED ORDER — TETRACAINE HCL 0.5 % OP SOLN
OPHTHALMIC | Status: AC
  Administered 2012-02-19: 2 [drp] via OPHTHALMIC
  Filled 2012-02-19: qty 2

## 2012-02-19 MED ORDER — FLUORESCEIN SODIUM 1 MG OP STRP
1.0000 | ORAL_STRIP | Freq: Once | OPHTHALMIC | Status: AC
  Administered 2012-02-19: 1 via OPHTHALMIC

## 2012-02-19 MED ORDER — TETRACAINE HCL 0.5 % OP SOLN
2.0000 [drp] | Freq: Once | OPHTHALMIC | Status: AC
  Administered 2012-02-19: 2 [drp] via OPHTHALMIC

## 2012-02-19 MED ORDER — ACYCLOVIR 800 MG PO TABS: 800.0000 mg | ORAL_TABLET | Freq: Every day | ORAL | Status: DC

## 2012-02-19 MED ORDER — HYDROCODONE-ACETAMINOPHEN 5-500 MG PO TABS: 1.0000 | ORAL_TABLET | Freq: Four times a day (QID) | ORAL | Status: DC | PRN

## 2012-02-19 NOTE — ED Provider Notes (Signed)
History     CSN: [id number]  Arrival date & time 02/19/12  1826   First MD Initiated Contact with Patient 02/19/12 1852      Chief Complaint  Patient presents with  . Eye Pain  . Facial Pain    (Consider location/radiation/quality/duration/timing/severity/associated sxs/prior treatment) HPI Comments: Pt states that she was seen 3 days ago and diagnosed with conjunctivitis:pt states that she has developed jaw pain on the right side since that time:pt states that she was seen by her pcp today and was told that she may have shingles:pt also had a ct scan that didn't show anything  The history is provided by the patient. No language interpreter was used.    Past Medical History  Diagnosis Date  . Exercise-induced asthma   . Panic disorder   . Dysuria-frequency syndrome   . Chronic pain     flank  . Sprain of MCL (medial collateral ligament) of knee     Past Surgical History  Procedure Date  . Tubal ligation 06/28/2005  . Endometrial ablation 2009    Family History  Problem Relation Age of Onset  . Lung cancer Mother   . Fibroids Sister     History  Substance Use Topics  . Smoking status: Former Smoker    Types: Cigarettes  . Smokeless tobacco: Former Neurosurgeon    Quit date: 10/22/2010  . Alcohol Use: 0.0 oz/week     rare    OB History    Grav Para Term Preterm Abortions TAB SAB Ect Mult Living                  Review of Systems  Allergies  Sulfonamide derivatives and Tramadol hcl  Home Medications   Current Outpatient Rx  Name Route Sig Dispense Refill  . ACYCLOVIR 800 MG PO TABS Oral Take 1 tablet (800 mg total) by mouth 5 (five) times daily. 35 tablet 0  . ALPRAZOLAM 2 MG PO TABS  Take 1/4 tab every 6 hours as needed Take 1/4 tab every 6 hours as needed 10 tablet 0    Pt will need to be seen for any additional refills ...  . CYCLOBENZAPRINE HCL 5 MG PO TABS Oral Take 1 tablet (5 mg total) by mouth 3 (three) times daily as needed. For spasm 30 tablet 0    . DOXAZOSIN MESYLATE 1 MG PO TABS Oral Take 1 tablet (1 mg total) by mouth at bedtime. 30 tablet 0  . FAMOTIDINE 20 MG PO TABS Oral Take 20 mg by mouth daily as needed. For itching    . GABAPENTIN 400 MG PO CAPS  1 tab in am and afternoon, 2 tabs at night 140 capsule 6  . HYDROCODONE-ACETAMINOPHEN 5-500 MG PO TABS Oral Take 1 tablet by mouth 2 (two) times daily as needed. 10 tablet 0  . IBUPROFEN 800 MG PO TABS Oral Take 1 tablet (800 mg total) by mouth every 8 (eight) hours as needed for pain. 30 tablet 0  . LAMOTRIGINE 200 MG PO TABS Oral Take 1 tablet (200 mg total) by mouth daily. 30 tablet 6  . METHYLPREDNISOLONE 4 MG PO KIT Oral Take by mouth. follow package directions     . TRIAMCINOLONE ACETONIDE 0.1 % EX CREA Topical Apply topically 2 (two) times daily. To affected areas on body.  Do not apply to face     . POLYMYXIN B-TRIMETHOPRIM 10000-0.1 UNIT/ML-% OP SOLN Right Eye Place 1 drop into the right eye every 4 (four) hours. 10  mL 0    BP 130/84  Pulse 102  Temp 98.1 F (36.7 C) (Oral)  Resp 18  Ht 5\' 4"  (1.626 m)  Wt 170 lb (77.111 kg)  BMI 29.18 kg/m2  SpO2 100%  Physical Exam  Constitutional: She appears well-developed and well-nourished.  HENT:  Head: Atraumatic.  Right Ear: External ear normal.  Left Ear: External ear normal.       Tenderness noted over right tmj  Eyes: EOM are normal. Pupils are equal, round, and reactive to light. Right conjunctiva is injected.  Slit lamp exam:      The right eye shows no corneal abrasion and no fluorescein uptake.       No sign of herpetic whitlow noted  Cardiovascular: Normal rate.   Pulmonary/Chest: Effort normal and breath sounds normal.  Musculoskeletal: Normal range of motion.  Neurological: She is alert.    ED Course  Procedures (including critical care time)  Labs Reviewed - No data to display Ct Soft Tissue Neck W Contrast  02/19/2012  *RADIOLOGY REPORT*  Clinical Data: Trismus.  Sore throat.  Rule out peritonsillar  abscess.  CT NECK WITH CONTRAST  Technique:  Multidetector CT imaging of the neck was performed with intravenous contrast.  Contrast: OMNIPAQUE IOHEXOL 300 MG/ML  SOLN  Comparison: None.  Findings: Lung apices are clear.  No superior mediastinal pathology.  Limited visualization of the intracranial contents does not show any abnormality.  Both parotid glands are normal.  Both submandibular glands are normal.  The thyroid gland is normal.  There are no pathologically enlarged lymph nodes on either side of the neck.  No evidence of tonsillar mass or inflammation.  No other mucosal or submucosal lesion.  Arterial and venous structures appear normal.  Visualized sinuses, middle ears and mastoids are clear.  IMPRESSION: Normal examination.  No evidence of tonsillar pathology.  No cause of the patient's described symptoms is identified.  Original Report Authenticated By: Thomasenia Sales, M.D.     No diagnosis found.    MDM  Review ct scan and office visit from earlier today:no sign of herpetic whitlow noted;Pt already has drops and acyclovir:will give something for pain        Teressa Lower, NP 02/19/12 1924

## 2012-02-19 NOTE — Addendum Note (Signed)
Addended by: Jimmy Footman K on: 02/19/2012 04:06 PM   Modules accepted: Orders

## 2012-02-19 NOTE — Patient Instructions (Addendum)
It was nice to meet you today.  I'm sorry you're feeling worse.  I am going to get a scan of your neck to make sure there is not an infection causing this jaw pain.  Please go to [facility] as soon as you leave here. DO NOT EAT!  You can stop the eye drops for now if you would like.  I am sending in a prescription for a medicine in case this is shingles.  Please start taking it as soon as you pick it up.  You will need to take it 5 times per day for 7 days.    Take Ibuprofen 800mg  three times per day to help with the pain and inflammation.  We gave you a shot to help with this in the clinic.    Please come back on FRIDAY to be rechecked.

## 2012-02-19 NOTE — Progress Notes (Signed)
S: Pt comes in today for worsening pink eye.  Was seen 7/1 and diagnosed with viral pharyngitis and conjunctivitis.  Started on polytrim eye drops.  Feels like right eye is getting worse and now complains of right ear and jaw pain.  Eye is "weepy" with some drainage-- clearish but thick.  Eye was matted shut this morning.  Hurts to touch eye.  More red than before.  Painful to blink but NO pain with movement.  Having pain with opening jaw on the right side.  Feels like there is a knot over her right jaw.  Pain with touching right jaw/TMJ.  NO new fevers since Monday.  + sore throat, but a little better.  + nasal congestion/drainage.     Case discussed with Dr. Christain Sacramento, on call ENT, who suggested getting neck CT and starting high dose NSAIDs.  Dr. Jennette Kettle also precepted and examined patient and agrees with assessment and plan. ROS: Per HPI  History  Smoking status  . Former Smoker  . Types: Cigarettes  Smokeless tobacco  . Former Neurosurgeon  . Quit date: 10/22/2010    O:  Filed Vitals:   02/19/12 1104  BP: 128/83  Pulse: 93  Temp: 98.5 F (36.9 C)    Gen: NAD but uncomfortable HEENT: significant conjunctival irritation and injection in right eye; no irritation left eye; MMM, EOMI, PERRLA, mild pharyngeal erythema w/o exudate, mild cervical LAD, TMs normal bilaterally, hypersensitivity over right TMJ, pain with both light and deep palpation w/o overlying bruising, rash, or discoloration; no facial swelling or masses; full opening of jaw, but pain once 3/4 of the way open to fully open CV: RRR, no murmur Pulm: CTA bilat, no wheezes or crackles   A/P: 36 y.o. female p/w conjunctivitis, trismus  -See problem list -f/u in 2 days

## 2012-02-19 NOTE — Assessment & Plan Note (Addendum)
Unclear etiology, ddx includes herpes zoster with hypersensitivity vs concern for peritonsillar or deep neck infection/abscess.  Discussed with both Dr. Jennette Kettle and ENT on call.  Will send for CT neck w/ contrast.  Will start on acyclovir in case this is shingles.  Will also start on high dose NSAIDs (ibuprofen 800 TID). Toradol 60mg  IM given in clinic.  Pt to f/u in 2 days.  Per ENT, trismus is not very common in pt w/o history of TMJ issue (which this pt does not have); however, severe pharyngitis can also be cause of trismus, but pt reports sore throat has been improving and not worsening.

## 2012-02-19 NOTE — Assessment & Plan Note (Signed)
Unclear, but may still be viral vs irritation from Polytrim eye drops.  Advised d/c eye drops for now.

## 2012-02-19 NOTE — ED Notes (Signed)
Pt c/o right eye and right facial pain, recent dx shingles , here today for increased pain

## 2012-02-20 NOTE — ED Provider Notes (Signed)
Medical screening examination/treatment/procedure(s) were performed by non-physician practitioner and as supervising physician I was immediately available for consultation/collaboration.   Anvi Mangal, MD 02/20/12 0011 

## 2012-02-21 ENCOUNTER — Encounter: Payer: Self-pay | Admitting: Family Medicine

## 2012-02-21 ENCOUNTER — Ambulatory Visit (INDEPENDENT_AMBULATORY_CARE_PROVIDER_SITE_OTHER): Payer: Self-pay | Admitting: Family Medicine

## 2012-02-21 ENCOUNTER — Encounter (HOSPITAL_COMMUNITY): Payer: Self-pay | Admitting: *Deleted

## 2012-02-21 ENCOUNTER — Emergency Department (HOSPITAL_COMMUNITY)
Admission: EM | Admit: 2012-02-21 | Discharge: 2012-02-22 | Disposition: A | Discharge status: Home Health | Payer: Self-pay | Attending: Emergency Medicine | Admitting: Emergency Medicine

## 2012-02-21 VITALS — BP 128/83 | HR 102 | Temp 98.7°F | Ht 64.0 in | Wt 174.5 lb

## 2012-02-21 DIAGNOSIS — H1011 Acute atopic conjunctivitis, right eye: Secondary | ICD-10-CM

## 2012-02-21 DIAGNOSIS — R221 Localized swelling, mass and lump, neck: Secondary | ICD-10-CM | POA: Insufficient documentation

## 2012-02-21 DIAGNOSIS — R220 Localized swelling, mass and lump, head: Secondary | ICD-10-CM | POA: Insufficient documentation

## 2012-02-21 DIAGNOSIS — Z79899 Other long term (current) drug therapy: Secondary | ICD-10-CM | POA: Insufficient documentation

## 2012-02-21 DIAGNOSIS — H109 Unspecified conjunctivitis: Secondary | ICD-10-CM

## 2012-02-21 DIAGNOSIS — H1045 Other chronic allergic conjunctivitis: Secondary | ICD-10-CM | POA: Insufficient documentation

## 2012-02-21 MED ORDER — AMOXICILLIN 500 MG PO CAPS: 500.0000 mg | ORAL_CAPSULE | Freq: Two times a day (BID) | ORAL | Status: AC

## 2012-02-21 MED ORDER — HYDROCODONE-ACETAMINOPHEN 5-500 MG PO TABS: 1.0000 | ORAL_TABLET | Freq: Four times a day (QID) | ORAL | Status: DC | PRN

## 2012-02-21 NOTE — Patient Instructions (Signed)
Sandra Briggs,  Thank you for coming in today. Your exam is negative for foreign body and serious viral infection. There is no evidence of herpes zoster (shingles).  Please do the following: 1. Stop acyclovir 2. Start amoxicillin 500 mg twice daily x 7 days 3. Continue with cool compress, make sure to use a clean towel 4. Continue with Vicodin as needed for severe pain, for mild-moderate pain take motrin or tylenol (make sure not to take more than 4000 mg of tylenol in one day)  Pease come back on Monday for f/u. Call and come in if  you develop fever, decreased vision, or worsening pain.   Dr. Armen Pickup

## 2012-02-21 NOTE — ED Notes (Signed)
Pt presents w/ c/o head and facial pain and eye infection which she has seen multiple doctors multiple times. Pt started having increased and worsening pain in eye, head, and face today, started fever, vomiting.

## 2012-02-21 NOTE — Assessment & Plan Note (Signed)
A: persistent R eye swelling and pain despite valtrex. Exam negative for foreign body or HSV. Suspect severe course of viral conjunctivitis with secondary bacterial infection.  P:  -stop valtrex -start oral antibiotic: amoxicillin -pain control with cold compress, NSAID, tylenol, vicodin for severe pain only per AVS (prescribed additional 10). -reviewed s/s of worsening that should prompt patient to seek care per AVS. -close f/u in office on Monday.

## 2012-02-21 NOTE — Progress Notes (Signed)
Subjective:     Patient ID: Sandra Briggs, female   DOB: [birth year], 36 y.o.   MRN: [id number]  HPI 36 yo F presents for same day visit to f/u R eye conjunctivitis x 5 days. She reports waking up with discharge, itching and mild swelling in her L eye. She was in her normal state of health when she went to bed. She was initially evaluated and started on polytrim ophthalmic drops for viral conjunctivitis. She returned two days later with worsening symptoms concerning for ocular shingles vs. Abscess formation. She was instructed to stop the Polytrim eye drops and started on valtrex. In addition she was sent for a CT scan of her head and neck which was negative. Later that same day she went to the ED due to worsening pain which she describes as sharp pains in her eye. She was evaluated with a fluorescin exam which was negative, her pain was treated with Vicodin and she was instructed to f/u in the clinic.   Today she reports sharp pains in her eye, she has drainage that is clear-yellow at night, she denies fever, she admits to pain with swallowing on her R side but states that her sore throat as improved, she admits to headache and temple pain on her R side. Her pain is temporarily relieved by Vicodin.   Review of Systems As per HPI    Objective:   Physical Exam  Constitutional: She appears well-developed and well-nourished. She is active. She appears distressed.       Distressed when her eyes are manipulated.   HENT:  Head: Normocephalic and atraumatic.  Right Ear: Hearing, tympanic membrane, external ear and ear canal normal.  Left Ear: Hearing, tympanic membrane, external ear and ear canal normal.  Nose: Nose normal.  Mouth/Throat: Posterior oropharyngeal edema and posterior oropharyngeal erythema present. No oropharyngeal exudate or tonsillar abscesses.  Eyes: EOM are normal. Pupils are equal, round, and reactive to light. No foreign bodies found. Right eye exhibits discharge. Right eye exhibits  no hordeolum. No foreign body present in the right eye. Left eye exhibits no hordeolum. No foreign body present in the left eye. Right conjunctiva is injected. Left conjunctiva is injected. No scleral icterus.  Slit lamp exam:      The right eye shows no fluorescein uptake.       Visual acuity B 20/20, L 20/20, R 20/25.   Neurological: She is alert.  Skin: Skin is warm, dry and intact. No rash noted.   Assessment and Plan:

## 2012-02-22 ENCOUNTER — Emergency Department (HOSPITAL_COMMUNITY): Payer: Self-pay

## 2012-02-22 ENCOUNTER — Ambulatory Visit (HOSPITAL_COMMUNITY): Payer: Self-pay

## 2012-02-22 LAB — CBC WITH DIFFERENTIAL/PLATELET
Basophils Absolute: 0 10*3/uL (ref 0.0–0.1)
Eosinophils Absolute: 0.3 10*3/uL (ref 0.0–0.7)
Eosinophils Relative: 3 % (ref 0–5)
Lymphocytes Relative: 22 % (ref 12–46)
MCV: 92 fL (ref 78.0–100.0)
Neutrophils Relative %: 68 % (ref 43–77)
Platelets: 167 10*3/uL (ref 150–400)
RDW: 11.6 % (ref 11.5–15.5)
WBC: 9.3 10*3/uL (ref 4.0–10.5)

## 2012-02-22 LAB — POCT I-STAT, CHEM 8
BUN: 6 mg/dL (ref 6–23)
Hemoglobin: 12.6 g/dL (ref 12.0–15.0)
Sodium: 140 mEq/L (ref 135–145)
TCO2: 22 mmol/L (ref 0–100)

## 2012-02-22 MED ORDER — ONDANSETRON HCL 4 MG/2ML IJ SOLN
4.0000 mg | Freq: Once | INTRAMUSCULAR | Status: AC
  Administered 2012-02-22: 4 mg via INTRAVENOUS
  Filled 2012-02-22: qty 2

## 2012-02-22 MED ORDER — OXYCODONE-ACETAMINOPHEN 5-325 MG PO TABS
2.0000 | ORAL_TABLET | Freq: Once | ORAL | Status: AC
  Administered 2012-02-22: 1 via ORAL
  Filled 2012-02-22: qty 2

## 2012-02-22 MED ORDER — KETOROLAC TROMETHAMINE 0.5 % OP SOLN
1.0000 [drp] | Freq: Once | OPHTHALMIC | Status: AC
  Administered 2012-02-22: 1 [drp] via OPHTHALMIC
  Filled 2012-02-22: qty 3

## 2012-02-22 MED ORDER — OXYCODONE-ACETAMINOPHEN 5-325 MG PO TABS
ORAL_TABLET | ORAL | Status: AC
  Administered 2012-02-22: 1
  Filled 2012-02-22: qty 1

## 2012-02-22 MED ORDER — PROPARACAINE HCL 0.5 % OP SOLN
1.0000 [drp] | Freq: Once | OPHTHALMIC | Status: AC
  Administered 2012-02-22: 1 [drp] via OPHTHALMIC
  Filled 2012-02-22: qty 15

## 2012-02-22 MED ORDER — SODIUM CHLORIDE 0.9 % IV SOLN
Freq: Once | INTRAVENOUS | Status: AC
  Administered 2012-02-22: 02:00:00 via INTRAVENOUS

## 2012-02-22 NOTE — ED Provider Notes (Signed)
History     CSN: [id number]  Arrival date & time 02/21/12  2205   First MD Initiated Contact with Patient 02/22/12 0006      Chief Complaint  Patient presents with  . Eye Problem    (Consider location/radiation/quality/duration/timing/severity/associated sxs/prior treatment) HPI Comments: Pateint with R eye redness for several days has been seen by PCP X2   Forts time given antibiotic drops second time given antiviral medication   Both stopped by PCP  Had Ct Scan of head which is normal now with increased pain and redness No visual changes but having facial pain without erythema   Patient is a 36 y.o. female presenting with eye problem. The history is provided by the patient.  Eye Problem  This is a recurrent problem. The current episode started more than 1 week ago. The problem occurs constantly. The problem has been gradually worsening. There is pain in the right eye. There was no injury mechanism. The pain is at a severity of 6/10. The pain is moderate. There is no history of trauma to the eye. There is no known exposure to pink eye. She does not wear contacts. Associated symptoms include discharge and eye redness. Pertinent negatives include no blurred vision, no decreased vision, no double vision, no foreign body sensation, no photophobia, no nausea and no weakness.    Past Medical History  Diagnosis Date  . Exercise-induced asthma   . Panic disorder   . Dysuria-frequency syndrome   . Chronic pain     flank  . Sprain of MCL (medial collateral ligament) of knee     Past Surgical History  Procedure Date  . Tubal ligation 06/28/2005  . Endometrial ablation 2009    Family History  Problem Relation Age of Onset  . Lung cancer Mother   . Fibroids Sister     History  Substance Use Topics  . Smoking status: Current Everyday Smoker    Types: Cigarettes  . Smokeless tobacco: Former Neurosurgeon    Quit date: 10/22/2010  . Alcohol Use: 0.0 oz/week     rare    OB History    Grav Para Term Preterm Abortions TAB SAB Ect Mult Living                  Review of Systems  Constitutional: Negative for fever and chills.  HENT: Positive for facial swelling. Negative for neck pain.   Eyes: Positive for discharge, redness and itching. Negative for blurred vision, double vision, photophobia and visual disturbance.  Gastrointestinal: Negative for nausea.  Neurological: Negative for dizziness, weakness and headaches.    Allergies  Sulfonamide derivatives and Tramadol hcl  Home Medications   Current Outpatient Rx  Name Route Sig Dispense Refill  . ACETAMINOPHEN 500 MG PO TABS Oral Take 1,000 mg by mouth every 6 (six) hours as needed. Patient used this medication for neck pain.    Marland Kitchen AMOXICILLIN 500 MG PO CAPS Oral Take 1 capsule (500 mg total) by mouth 2 (two) times daily. 14 capsule 0  . GABAPENTIN 400 MG PO CAPS  1 tab in am and afternoon, 2 tabs at night 140 capsule 6  . HYDROCODONE-ACETAMINOPHEN 5-500 MG PO TABS Oral Take 1-2 tablets by mouth every 6 (six) hours as needed for pain. 10 tablet 0  . IBUPROFEN 800 MG PO TABS Oral Take 800 mg by mouth every 8 (eight) hours as needed. Patient uses this medication for pain.    Marland Kitchen LAMOTRIGINE 200 MG PO TABS Oral Take 200  mg by mouth daily. Taking for bi-polar disorder      BP 110/69  Pulse 70  Temp 98 F (36.7 C) (Oral)  Resp 24  SpO2 100%  Physical Exam  Constitutional: She is oriented to person, place, and time. She appears well-nourished.  Eyes: EOM are normal. Pupils are equal, round, and reactive to light. Right conjunctiva is injected. Right conjunctiva has no hemorrhage.  Fundoscopic exam:      The right eye shows no hemorrhage and no papilledema.  Slit lamp exam:      The right eye shows no fluorescein uptake and no anterior chamber bulge.  Neck: Normal range of motion. Neck supple.  Cardiovascular: Normal rate.   Pulmonary/Chest: Effort normal.  Musculoskeletal: Normal range of motion.  Lymphadenopathy:     She has no cervical adenopathy.  Neurological: She is alert and oriented to person, place, and time.  Skin: Skin is warm. No rash noted. No erythema. No pallor.    ED Course  Procedures (including critical care time)  Labs Reviewed  POCT I-STAT, CHEM 8 - Abnormal; Notable for the following:    Glucose, Bld 123 (*)     All other components within normal limits  CBC WITH DIFFERENTIAL   No results found.   1. Allergic conjunctivitis of right eye       MDM   I believe this to allergic in nature  Reviewed previous  Ct Scan and today's labs-- normal No fluorecin dye uptake to indicate herpetic lesion Will treat with Ketorolac drops 4 times daily and have patient see Dr. Charlotte Sanes on Monday for follow up evaluation         Arman Filter, NP 02/22/12 0251  Arman Filter, NP 02/22/12 [id number]

## 2012-02-22 NOTE — ED Provider Notes (Signed)
Medical screening examination/treatment/procedure(s) were performed by non-physician practitioner and as supervising physician I was immediately available for consultation/collaboration.  Sunnie Nielsen, MD 02/22/12 [phone]

## 2012-02-24 ENCOUNTER — Encounter: Payer: Self-pay | Admitting: Family Medicine

## 2012-02-24 ENCOUNTER — Ambulatory Visit (INDEPENDENT_AMBULATORY_CARE_PROVIDER_SITE_OTHER): Payer: Self-pay | Admitting: Family Medicine

## 2012-02-24 VITALS — BP 113/78 | HR 79 | Temp 98.4°F | Ht 64.0 in | Wt 172.0 lb

## 2012-02-24 DIAGNOSIS — J029 Acute pharyngitis, unspecified: Secondary | ICD-10-CM

## 2012-02-24 DIAGNOSIS — H109 Unspecified conjunctivitis: Secondary | ICD-10-CM

## 2012-02-24 NOTE — Progress Notes (Signed)
  Subjective:    Patient ID: Sandra Briggs, female    DOB: 1975/12/15, 36 y.o.   MRN: [id number]  HPI Here for follow-up of of conjunctivitis and pharyngitis, onset 1 week ago.  In past week, has been seen in our office twice and in ER twice.  Overall, pharyngitis improving.  No fever.  Both eyes red, painful, no itchy.  No photophobia.  Has been very worried she has serious illness, states she recognizes her worry may be out of proportion to illness.  Review of Systemssee HPI  I have reviewed patient's  PMH, FH, and Social history and Medications as related to this visit. Presentation complicated by anxiety.    Objective:   Physical Exam GEN: Alert & Oriented, No acute distress HEENT: [place]/AT. EOMI, PERRLA, mild bilateral conjunctival injection. Bilateral tympanic membranes intact without erythema or effusion.  .  Nares without edema or rhinorrhea.  Oropharynx is without erythema or exudates.  No anterior or posterior cervical lymphadenopathy.          Assessment & Plan:

## 2012-02-24 NOTE — Assessment & Plan Note (Signed)
Viral conjunctivitis, improving.  Reassured patient.  Will return if new symptoms or does not continue to improve.

## 2012-02-24 NOTE — Patient Instructions (Addendum)
Your symptoms are improving as expected.  Please make an appt at the end of the week if you are getting worse or do not continue to get better as expected

## 2012-02-24 NOTE — Assessment & Plan Note (Signed)
Is finishing course of amoxicillin. Course improving as expected.

## 2012-02-27 ENCOUNTER — Ambulatory Visit (INDEPENDENT_AMBULATORY_CARE_PROVIDER_SITE_OTHER): Payer: Self-pay | Admitting: Emergency Medicine

## 2012-02-27 ENCOUNTER — Encounter: Payer: Self-pay | Admitting: Emergency Medicine

## 2012-02-27 VITALS — BP 122/81 | HR 86 | Ht 64.0 in | Wt 173.0 lb

## 2012-02-27 DIAGNOSIS — F410 Panic disorder [episodic paroxysmal anxiety] without agoraphobia: Secondary | ICD-10-CM

## 2012-02-27 DIAGNOSIS — F319 Bipolar disorder, unspecified: Secondary | ICD-10-CM

## 2012-02-27 DIAGNOSIS — E663 Overweight: Secondary | ICD-10-CM | POA: Insufficient documentation

## 2012-02-27 DIAGNOSIS — H109 Unspecified conjunctivitis: Secondary | ICD-10-CM

## 2012-02-27 DIAGNOSIS — M542 Cervicalgia: Secondary | ICD-10-CM

## 2012-02-27 DIAGNOSIS — Z6825 Body mass index (BMI) 25.0-25.9, adult: Secondary | ICD-10-CM

## 2012-02-27 MED ORDER — HYDROCODONE-ACETAMINOPHEN 5-500 MG PO TABS: 1.0000 | ORAL_TABLET | Freq: Four times a day (QID) | ORAL | Status: DC | PRN

## 2012-02-27 MED ORDER — ALPRAZOLAM 2 MG PO TABS: 2.0000 mg | ORAL_TABLET | Freq: Every day | ORAL | Status: DC

## 2012-02-27 NOTE — Assessment & Plan Note (Signed)
Stable.  Continue gabapentin and vicodin (#30 given today).

## 2012-02-27 NOTE — Progress Notes (Signed)
  Subjective:    Patient ID: Sandra Briggs, female    DOB: 1976-03-05, 36 y.o.   MRN: [id number]  HPI Sandra Briggs is here to meet new MD and medication refill.  Med refill: Needs refill of vicodin for chronic neck pain.  This is doing well with the gabapentin.  Also needs xanax for panic attacks.  States that she usually does a good job controlling her anxiety, but when she has a panic attack she needs the medication.  Weight: Patient would like to lose some weight.  She reports eating 3 meals a day, breakfast of toast with peanut butter, lunch at [employer] or [employer], and dinner of chicken with a vegetable.  Exercise consists of walking/playing with kids.  Conjunctivitis: Improving after getting some ketoralac eye drops in the ED.   I have reviewed and updated the following as appropriate: allergies, current medications, past family history, past medical history, past social history and past surgical history SHx: former smoker  Review of Systems See HPI    Objective:   Physical Exam BP 122/81  Pulse 86  Ht 5\' 4"  (1.626 m)  Wt 173 lb (78.472 kg)  BMI 29.70 kg/m2 Gen: alert, cooperative, very talkative, but not pressured HEENT: AT/[place], sclera with minimal to no injection CV: RRR, no murmurs Pulm: CTAB, no wheezes or rales Ext: no edema      Assessment & Plan:

## 2012-02-27 NOTE — Assessment & Plan Note (Signed)
Stable.  Refill xanax #30 tabs today.  Sees Dr. Pascal Lux for bipolar.

## 2012-02-27 NOTE — Patient Instructions (Addendum)
It was nice to meet you!  Cut out the sweet tea - that will be a good first step for weight loss.  At any time, if you are interested, you can call the office and ask about being referred to out nutritionist, Dr. Gerilyn Pilgrim.   Follow up in 1 month for medication refills.

## 2012-02-27 NOTE — Assessment & Plan Note (Signed)
Interested in losing some weight.  Discussed options.  To work of cutting out sweet tea.  Offered referral to Dr. Gerilyn Pilgrim, but declined at this time.

## 2012-02-27 NOTE — Assessment & Plan Note (Signed)
Appears revved up today.  Patient has not been taking Lamictal for last week due to cost.  Plans and refilling her prescription in the next week.

## 2012-02-27 NOTE — Assessment & Plan Note (Addendum)
Improving with ketoralac eye drops from the ED.

## 2012-03-16 ENCOUNTER — Ambulatory Visit (INDEPENDENT_AMBULATORY_CARE_PROVIDER_SITE_OTHER): Payer: Self-pay | Admitting: Family Medicine

## 2012-03-16 ENCOUNTER — Encounter: Payer: Self-pay | Admitting: Family Medicine

## 2012-03-16 VITALS — BP 131/88 | HR 125 | Temp 98.5°F | Ht 64.0 in | Wt 173.0 lb

## 2012-03-16 DIAGNOSIS — H109 Unspecified conjunctivitis: Secondary | ICD-10-CM

## 2012-03-16 DIAGNOSIS — M542 Cervicalgia: Secondary | ICD-10-CM

## 2012-03-16 MED ORDER — HYDROCODONE-ACETAMINOPHEN 5-500 MG PO TABS: 1.0000 | ORAL_TABLET | Freq: Every evening | ORAL | Status: DC | PRN

## 2012-03-16 MED ORDER — ERYTHROMYCIN 5 MG/GM OP OINT: TOPICAL_OINTMENT | Freq: Four times a day (QID) | OPHTHALMIC | Status: AC

## 2012-03-16 NOTE — Patient Instructions (Addendum)
I think this is likely caused by a virus If not improving within a few days you can follow up at our office or give the erythromycin ointment a try. If you are not getting better within a week or so, we should try to get you an appointment with an eye doctor since this is a recurrent thing.  Use cold compresses for pain relief for now. Use artificial tears as needed. Return for any worsening or vision changes.    Conjunctivitis Conjunctivitis is commonly called "pink eye." Conjunctivitis can be caused by bacterial or viral infection, allergies, or injuries. There is usually redness of the lining of the eye, itching, discomfort, and sometimes discharge. There may be deposits of matter along the eyelids. A viral infection usually causes a watery discharge, while a bacterial infection causes a yellowish, thick discharge. Pink eye is very contagious and spreads by direct contact. You may be given antibiotic eyedrops as part of your treatment. Before using your eye medicine, remove all drainage from the eye by washing gently with warm water and cotton balls. Continue to use the medication until you have awakened 2 mornings in a row without discharge from the eye. Do not rub your eye. This increases the irritation and helps spread infection. Use separate towels from other household members. Wash your hands with soap and water before and after touching your eyes. Use cold compresses to reduce pain and sunglasses to relieve irritation from light. Do not wear contact lenses or wear eye makeup until the infection is gone. SEEK MEDICAL CARE IF:   Your symptoms are not better after 3 days of treatment.   You have increased pain or trouble seeing.   The outer eyelids become very red or swollen.  Document Released: 09/12/2004 Document Revised: 07/25/2011 Document Reviewed: 08/05/2005 [facility] Patient Information 2012 [place], [place].

## 2012-03-16 NOTE — Progress Notes (Signed)
  Subjective:    Patient ID: Sandra Briggs, female    DOB: 12-Jun-1976, 36 y.o.   MRN: [id number]  HPI  1.  Eye Problem:  Woke up this morning with R eye reddened.  Had episode of conjunctivitis a couple of weeks ago that seemed to have resolved on its own.  No problems for the past two weeks until this morning.   States this feels "exactly the same as before." Tried on multiple medications previously, none really worked that well.  Has had matting and clear drainage throughout the day today.  She denies eye itching, foreign body sensation, light sensitivity, dizziness, vision changes.  She does not use contact lenses.   2.  Neck Pain: C/o neck pain radiating from behind R ear down into neck.  She denies numbness/weakness or ratdiation into arms.   Review of Systems Per HPI    Objective:   Physical Exam  Constitutional: She appears well-nourished. No distress.  HENT:  Head: Normocephalic.  Right Ear: External ear normal.  Left Ear: External ear normal.  Mouth/Throat: Oropharynx is clear and moist.  Eyes: EOM are normal. Pupils are equal, round, and reactive to light. Right eye exhibits discharge (Clear discharge). Right eye exhibits no exudate. Left eye exhibits no discharge and no exudate. Right conjunctiva is injected. Left conjunctiva is not injected.  Neck: Normal range of motion.       Tenderness from posterior neck behind ear into lower neck.  No nuchal ridgidity.           Assessment & Plan:

## 2012-03-16 NOTE — Assessment & Plan Note (Signed)
Seems to be recurrence of conjunctivitis.  Likely viral, however there could also be 2/2 bacterial infection given her recent bout with conjunctivitis. Doubt allergic as only one eye is affected and without itching.  Will give rx for erythromycin ointment, if no improvement in one week advised to f/u with our clinic, may need referral to ophtho given recurrent nature.

## 2012-03-16 NOTE — Assessment & Plan Note (Signed)
Seems to be exacerbation of her baseline neck pain.  Given 15 additional vicodin today.

## 2012-04-13 ENCOUNTER — Encounter: Payer: Self-pay | Admitting: Emergency Medicine

## 2012-04-13 ENCOUNTER — Ambulatory Visit (INDEPENDENT_AMBULATORY_CARE_PROVIDER_SITE_OTHER): Payer: Self-pay | Admitting: Emergency Medicine

## 2012-04-13 VITALS — BP 117/75 | HR 85 | Temp 98.0°F | Ht 64.0 in | Wt 176.9 lb

## 2012-04-13 DIAGNOSIS — M542 Cervicalgia: Secondary | ICD-10-CM

## 2012-04-13 DIAGNOSIS — B370 Candidal stomatitis: Secondary | ICD-10-CM | POA: Insufficient documentation

## 2012-04-13 DIAGNOSIS — F410 Panic disorder [episodic paroxysmal anxiety] without agoraphobia: Secondary | ICD-10-CM

## 2012-04-13 DIAGNOSIS — R300 Dysuria: Secondary | ICD-10-CM | POA: Insufficient documentation

## 2012-04-13 LAB — POCT URINALYSIS DIPSTICK
Protein, UA: NEGATIVE
Urobilinogen, UA: 0.2

## 2012-04-13 LAB — HIV ANTIBODY (ROUTINE TESTING W REFLEX): HIV: NONREACTIVE

## 2012-04-13 LAB — POCT UA - MICROSCOPIC ONLY

## 2012-04-13 MED ORDER — CEPHALEXIN 500 MG PO CAPS: 500.0000 mg | ORAL_CAPSULE | Freq: Three times a day (TID) | ORAL | Status: AC

## 2012-04-13 MED ORDER — CEFTRIAXONE SODIUM 1 G IJ SOLR
1.0000 g | Freq: Once | INTRAMUSCULAR | Status: AC
  Administered 2012-04-13: 1 g via INTRAMUSCULAR

## 2012-04-13 MED ORDER — NYSTATIN 100000 UNIT/ML MT SUSP: 500000.0000 [IU] | Freq: Four times a day (QID) | OROMUCOSAL | Status: DC

## 2012-04-13 MED ORDER — ALPRAZOLAM 2 MG PO TABS: ORAL_TABLET | ORAL | Status: DC

## 2012-04-13 MED ORDER — HYDROCODONE-ACETAMINOPHEN 5-500 MG PO TABS: 1.0000 | ORAL_TABLET | Freq: Three times a day (TID) | ORAL | Status: DC | PRN

## 2012-04-13 NOTE — Assessment & Plan Note (Signed)
Stable on gabapentin.  Refill Vicodin 5-500 #30.

## 2012-04-13 NOTE — Assessment & Plan Note (Signed)
History of this in the past; typically occurs after antibiotics.  Has been treated successfully with nystatin in the past.  Currently has white plaque on the tongue that does not scrape off easily.  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  Will give nystatin solution to use following antibiotic use.  Will also check HIV antibody - mostly for reassurance given anxiety disorder.

## 2012-04-13 NOTE — Patient Instructions (Signed)
It was nice to see you again.  I have refilled your vicodin and xanax.  You may have a bladder infection that is starting to affect your kidney.  You got a shot of ceftriaxone before leaving, and I sent a prescription for Keflex (take 3 times daily).  If you are not getting better over the next 3-4 days, please come back for further evaluation - this means fevers, worsening flank pain or continued urinary symptoms.    I have also sent a prescription for Nystatin solution.  I would wait to take this until after you complete the antibiotics.  We checked an HIV test.  I will be in contact by the end of the week to let you know the results of the HIV test and the urine culture.  Follow up as needed.

## 2012-04-13 NOTE — Assessment & Plan Note (Signed)
Stable.  Refilled xanax 2mg  take 1/4 tab prn #30.

## 2012-04-13 NOTE — Assessment & Plan Note (Signed)
Concern for early pyelonephritis given right CVA tenderness and urinary symptoms x1 week.  UA not particularly concerning.  Will send urine for culture.  No history concerning for pelvic infection.  Will treat as outpatient given lack of systemic signs. Will give ceftriaxone 1g IM here and give prescription for Keflex 500mg  TID x7 days.  Discussed red flags for returning.  If no improvement in 3-4 days, patient to return for further evaluation including a pelvic exam.

## 2012-04-13 NOTE — Addendum Note (Signed)
Addended by: [place], Myana Schlup on: 04/13/2012 05:20 PM   Modules accepted: Orders

## 2012-04-13 NOTE — Progress Notes (Signed)
  Subjective:    Patient ID: Sandra Briggs, female    DOB: Apr 08, 1976, 36 y.o.   MRN: [id number]  HPI Sarika R Hartney is here for med refill and urinary symptoms.  1. Med refills: Needs refills of vicodin and xanax.  States neck pain is doing well, the gabapentin really helps.  Does occasionally get flares that consist of a sharp pain and tightness in her neck.  Xanax is for panic attacks; typically takes 1/4-1/2 tab every 2-3 days, will occasionally need multiple doses in one day.  2. Urinary symptoms: Present for last week.  Describes frequency and urgency with decreased urine output.  + foul smelling urine.  Also describes "urethra spasms" after urinating but no true dysuria.  No hematuria.  Also has right flank pain that is kind of throbbing.  No fevers or emesis.  Has had some mild nausea the last 2 days.  Denies new sexual partners, vaginal discharge.  3. ?Oral thrush: Tongue has white layer on it that feels like thrush.  Has had thrush in the past, typically associated with antibiotic use.  Patient reports HIV tests during pregnancy in the past, but none are in our system.  Experiencing anxiety about not having test result available.  I have reviewed and updated the following as appropriate: allergies, current medications and problem list SHx: former smoker  Review of Systems See HPI    Objective:   Physical Exam BP 117/75  Pulse 85  Temp 98 F (36.7 C) (Oral)  Ht 5\' 4"  (1.626 m)  Wt 176 lb 14.4 oz (80.241 kg)  BMI 30.36 kg/m2 Gen: alert, cooperative, NAD HEENT: AT/[place], sclera white, MMM, thick white plaque on tongue that does not easily scrape off CV: RRR, no murmurs Pulm: CTAB, no wheezes or rales Back: + right CVA tenderness Abd: +BS, soft, ND, mild diffuse tenderness worse in suprapubic and right side Ext: no edema      Assessment & Plan:

## 2012-04-16 ENCOUNTER — Telehealth: Payer: Self-pay | Admitting: Emergency Medicine

## 2012-04-16 NOTE — Telephone Encounter (Signed)
Called and spoke with patient regarding lab results.  Patient reports improvement in symptoms since starting antibiotics.  Continues to have an occasional twinge of pain in her right kidney, but overall much improved.  Doing well with PO intake, no fevers.  Informed her that urine grew E. Coli that was sensitive to the antibiotic she is on.  Also informed her of negative HIV test.  Patient expressed understanding and had no questions at this time.

## 2012-05-07 ENCOUNTER — Ambulatory Visit (INDEPENDENT_AMBULATORY_CARE_PROVIDER_SITE_OTHER): Payer: Self-pay | Admitting: Emergency Medicine

## 2012-05-07 ENCOUNTER — Encounter: Payer: Self-pay | Admitting: Emergency Medicine

## 2012-05-07 VITALS — BP 129/85 | HR 77 | Ht 64.0 in | Wt 180.3 lb

## 2012-05-07 DIAGNOSIS — F410 Panic disorder [episodic paroxysmal anxiety] without agoraphobia: Secondary | ICD-10-CM

## 2012-05-07 DIAGNOSIS — M542 Cervicalgia: Secondary | ICD-10-CM

## 2012-05-07 MED ORDER — ALPRAZOLAM 2 MG PO TABS: ORAL_TABLET | ORAL | Status: DC

## 2012-05-07 MED ORDER — HYDROCODONE-ACETAMINOPHEN 5-500 MG PO TABS: 1.0000 | ORAL_TABLET | Freq: Three times a day (TID) | ORAL | Status: DC | PRN

## 2012-05-07 NOTE — Assessment & Plan Note (Signed)
Stable on gabapentin.  Mildly worse recently due to increased activity.  Will refill vicodin 5-500mg  #30; to be filled on 9/26.

## 2012-05-07 NOTE — Patient Instructions (Signed)
It was good to see you again!   I have refilled the xanax and vicodin - you can fill them on the 26th.  Follow up in 1 month for med refills.

## 2012-05-07 NOTE — Assessment & Plan Note (Signed)
Stable on current medications.  Will refill xanax #30; to be filled on 9/26.

## 2012-05-07 NOTE — Progress Notes (Signed)
  Subjective:    Patient ID: Sandra Briggs, female    DOB: [birth year], 36 y.o.   MRN: [id number]  HPI Sandra Briggs is here for med refill.  Needs refills of vicodin for neck pain and xanax for anxiety.  Reports doing very well overall.  Neck pain is a little worse as she has been busy and is participating in the [place] of [address].  I have reviewed and updated the following as appropriate: allergies and current medications SHx: former smoker   Review of Systems See HPI    Objective:   Physical Exam BP 129/85  Pulse 77  Ht 5\' 4"  (1.626 m)  Wt 180 lb 4.8 oz (81.784 kg)  BMI 30.95 kg/m2 Gen: alert, cooperative, NAD Neuro: gait normal     Assessment & Plan:

## 2012-06-10 ENCOUNTER — Telehealth: Payer: Self-pay | Admitting: Emergency Medicine

## 2012-06-10 DIAGNOSIS — M542 Cervicalgia: Secondary | ICD-10-CM

## 2012-06-10 DIAGNOSIS — F410 Panic disorder [episodic paroxysmal anxiety] without agoraphobia: Secondary | ICD-10-CM

## 2012-06-10 NOTE — Telephone Encounter (Signed)
Pt has an appt on 10/30 and will be out of her hydrocodone and alprazolam before then - wants to know if she can give refill until appt  [facility]

## 2012-06-10 NOTE — Telephone Encounter (Signed)
Will forward message to Dr. Elwyn Reach.

## 2012-06-11 MED ORDER — HYDROCODONE-ACETAMINOPHEN 5-500 MG PO TABS: 1.0000 | ORAL_TABLET | Freq: Three times a day (TID) | ORAL | Status: DC | PRN

## 2012-06-11 MED ORDER — ALPRAZOLAM 2 MG PO TABS: ORAL_TABLET | ORAL | Status: DC

## 2012-06-11 NOTE — Telephone Encounter (Signed)
Okay to call in prescriptions for Vicodin 5-500mg  #30 tabs and Xanax 2mg  #30 tabs to be filled on 06/13/2012.

## 2012-06-11 NOTE — Telephone Encounter (Signed)
Rx  for both meds called to pharmacy. Patient notified that they can be picked up on 10/26.

## 2012-06-17 ENCOUNTER — Ambulatory Visit: Payer: Self-pay | Admitting: Emergency Medicine

## 2012-06-24 ENCOUNTER — Ambulatory Visit (INDEPENDENT_AMBULATORY_CARE_PROVIDER_SITE_OTHER): Payer: Self-pay | Admitting: Emergency Medicine

## 2012-06-24 ENCOUNTER — Encounter: Payer: Self-pay | Admitting: Emergency Medicine

## 2012-06-24 ENCOUNTER — Ambulatory Visit: Payer: Self-pay | Admitting: Emergency Medicine

## 2012-06-24 VITALS — BP 129/84 | HR 92 | Ht 64.0 in | Wt 173.0 lb

## 2012-06-24 DIAGNOSIS — Z23 Encounter for immunization: Secondary | ICD-10-CM

## 2012-06-24 DIAGNOSIS — M542 Cervicalgia: Secondary | ICD-10-CM

## 2012-06-24 DIAGNOSIS — F410 Panic disorder [episodic paroxysmal anxiety] without agoraphobia: Secondary | ICD-10-CM

## 2012-06-24 DIAGNOSIS — R300 Dysuria: Secondary | ICD-10-CM

## 2012-06-24 DIAGNOSIS — B370 Candidal stomatitis: Secondary | ICD-10-CM

## 2012-06-24 MED ORDER — ALPRAZOLAM 2 MG PO TABS: ORAL_TABLET | ORAL | Status: DC

## 2012-06-24 MED ORDER — HYDROCODONE-ACETAMINOPHEN 5-500 MG PO TABS: 1.0000 | ORAL_TABLET | Freq: Three times a day (TID) | ORAL | Status: DC | PRN

## 2012-06-24 MED ORDER — NYSTATIN 100000 UNIT/ML MT SUSP: 500000.0000 [IU] | Freq: Four times a day (QID) | OROMUCOSAL | Status: AC

## 2012-06-24 MED ORDER — HYDROCOD POLST-CHLORPHEN POLST 10-8 MG/5ML PO LQCR: 5.0000 mL | Freq: Every evening | ORAL | Status: DC | PRN

## 2012-06-24 NOTE — Assessment & Plan Note (Signed)
Flared up a little after working in the [employer].  Prescription for Vicodin refilled for 11/26 with #40 instead of 30.  Will plan to go back to #30 in December.

## 2012-06-24 NOTE — Assessment & Plan Note (Signed)
Refilled xanax #30 to be filled on 07/14/12.

## 2012-06-24 NOTE — Assessment & Plan Note (Signed)
Has yellow-white plaque on back of tongue.  Will give another course of Nystatin.

## 2012-06-24 NOTE — Progress Notes (Signed)
  Subjective:    Patient ID: Sandra Briggs, female    DOB: [birth year], 36 y.o.   MRN: [id number]  HPI NAHLA LUKIN is here for med refills.  1. Med refills: Will need refill of xanax and vicodin the end of the month.  Doing well on these medications.  Her neck is flared up some after doing [employer].  2. URI: Has been sick with cough, fever, congestion.  Improved today, but cough is lingering, particularly at night.   3. Thrush: Still has a plaque on her tongue.  I have reviewed and updated the following as appropriate: allergies and current medications SHx: former smoker  Review of Systems See HPI    Objective:   Physical Exam BP 129/84  Pulse 92  Ht 5\' 4"  (1.626 m)  Wt 173 lb (78.472 kg)  BMI 29.70 kg/m2 Gen: alert, cooperative, NAD HEENT: AT/[place], sclera white, MMM, thick yellow-white plaque on back of tongue CV: RRR, no murmurs Pulm: CTAB, no wheezes or rales Neck: full AROM, mild tenderness over spinous processes Neuro: 5/5 strength in bilateral upper extremities, biceps reflex 1+ and symmetric, sensation intact to light touch      Assessment & Plan:  Flu shot today.

## 2012-06-24 NOTE — Patient Instructions (Addendum)
It was good to see you! Use the cough syrup at bedtime to try and help with the cough. Refills for Xanax and Vicodin given to be filled on the 26th.  DO NOT LOSE THESE AS THEY WILL NOT BE REPLACED! Follow up in December for med refills.

## 2012-08-10 ENCOUNTER — Encounter: Payer: Self-pay | Admitting: Emergency Medicine

## 2012-08-10 ENCOUNTER — Ambulatory Visit (INDEPENDENT_AMBULATORY_CARE_PROVIDER_SITE_OTHER): Payer: Self-pay | Admitting: Emergency Medicine

## 2012-08-10 VITALS — BP 136/68 | HR 110 | Temp 99.4°F | Ht 64.0 in | Wt 176.5 lb

## 2012-08-10 DIAGNOSIS — F410 Panic disorder [episodic paroxysmal anxiety] without agoraphobia: Secondary | ICD-10-CM

## 2012-08-10 DIAGNOSIS — M542 Cervicalgia: Secondary | ICD-10-CM

## 2012-08-10 DIAGNOSIS — J329 Chronic sinusitis, unspecified: Secondary | ICD-10-CM | POA: Insufficient documentation

## 2012-08-10 MED ORDER — AMOXICILLIN-POT CLAVULANATE 875-125 MG PO TABS: 1.0000 | ORAL_TABLET | Freq: Two times a day (BID) | ORAL | Status: DC

## 2012-08-10 MED ORDER — HYDROCODONE-ACETAMINOPHEN 5-500 MG PO TABS: 1.0000 | ORAL_TABLET | Freq: Three times a day (TID) | ORAL | Status: DC | PRN

## 2012-08-10 MED ORDER — ALPRAZOLAM 2 MG PO TABS: ORAL_TABLET | ORAL | Status: DC

## 2012-08-10 NOTE — Assessment & Plan Note (Addendum)
Has had waxing and waning symptoms for last 4-6 weeks.  Likely recurrent viral infections.  Some concern for bacterial sinusitis given pressure and sinus tenderness on exam.  Discussed using Afrin x3 days with her nasal saline spray.  Also provided a paper prescription for Augmenting 875mg  BID x10 to be filled if the sinus pressure does not improve with the Afrin given that she is going out of town for the holidays.

## 2012-08-10 NOTE — Assessment & Plan Note (Signed)
Doing well.  Will refill Xanax 2mg  #30.

## 2012-08-10 NOTE — Progress Notes (Signed)
  Subjective:    Patient ID: Sandra Briggs, female    DOB: 09/06/1975, 36 y.o.   MRN: [id number]  HPI Markiah R Aye is here for med refill and cold symptoms.  1. Med refill: Takes vicodin 5-500mg  as needed for neck pain.  This remains stable and she is due for her refill.  She uses Xanax 2mg  1/4 tab prn for panic disorder.  This is also doing well and she is due for a refill.  2. Cold symptoms: She has had intermittent cold symptoms for the last 4-6 weeks.  They were starting to improve when the worsened about 2 weeks ago.  Primarily cough and nasal congestion.  Does have some thick greenish/brown nasal discharge.  Describes sinus pressure behind her eyes.  She reports a fever to 100.8 2 days ago.  Continues to eat and drink appropriately.  No n/v/d, abdominal pain.    I have reviewed and updated the following as appropriate: allergies and current medications SHx: former smoker  Review of Systems See HPI    Objective:   Physical Exam BP 136/68  Pulse 110  Temp 99.4 F (37.4 C) (Oral)  Ht 5\' 4"  (1.626 m)  Wt 176 lb 8 oz (80.06 kg)  BMI 30.30 kg/m2 Gen: alert, cooperative, NAD HEENT: AT/[place], sclera white, PERRL, MMM, mild pharyngeal erythema without exudate, TMs normal bilaterally, nasal turbinates swollen bilaterally; tender to palpation over bilateral maxillary and frontal sinuses Neck: supple, no LAD CV: RRR, no murmurs Pulm: CTAB, no wheezes or rales Ext: no edema, brisk cap refill     Assessment & Plan:  Follow up in 1 month for med refills or sooner as needed.

## 2012-08-10 NOTE — Patient Instructions (Addendum)
It was good to see you! I have refilled the Vicodin and Xanax. I also gave you a prescription for Augmentin.  I don't think you have a sinus infection yet, but you're getting there. Please use Afrin every 12 hours for the next 3 days and the nasal saline spray to try and wash the sinuses out.  If you are having a lot of nasty nasal discharge with this or that pressure feeling is just not getting better, go ahead and start the Augmentin. Follow up in 1 month for med refills.

## 2012-08-10 NOTE — Assessment & Plan Note (Signed)
Stable on Vicodin.  Will provide refill Vicodin 5-500mg  #30.

## 2012-09-08 ENCOUNTER — Telehealth: Payer: Self-pay | Admitting: *Deleted

## 2012-09-08 ENCOUNTER — Encounter (HOSPITAL_COMMUNITY): Payer: Self-pay

## 2012-09-08 ENCOUNTER — Encounter: Payer: Self-pay | Admitting: Family Medicine

## 2012-09-08 ENCOUNTER — Ambulatory Visit (INDEPENDENT_AMBULATORY_CARE_PROVIDER_SITE_OTHER): Payer: Self-pay | Admitting: Family Medicine

## 2012-09-08 ENCOUNTER — Ambulatory Visit (HOSPITAL_COMMUNITY)
Admission: RE | Admit: 2012-09-08 | Discharge: 2012-09-08 | Disposition: A | Discharge status: Home / Self Care | Payer: Self-pay | Source: Ambulatory Visit | Attending: Family Medicine | Admitting: Family Medicine

## 2012-09-08 VITALS — BP 135/89 | HR 121 | Temp 98.4°F | Ht 64.0 in | Wt 178.0 lb

## 2012-09-08 DIAGNOSIS — R109 Unspecified abdominal pain: Secondary | ICD-10-CM

## 2012-09-08 DIAGNOSIS — R339 Retention of urine, unspecified: Secondary | ICD-10-CM

## 2012-09-08 DIAGNOSIS — R509 Fever, unspecified: Secondary | ICD-10-CM | POA: Insufficient documentation

## 2012-09-08 DIAGNOSIS — M549 Dorsalgia, unspecified: Secondary | ICD-10-CM

## 2012-09-08 LAB — POCT URINALYSIS DIPSTICK
Protein, UA: NEGATIVE
Spec Grav, UA: >=1.03
Urobilinogen, UA: 0.2
pH, UA: 5.5

## 2012-09-08 LAB — CBC WITH DIFFERENTIAL/PLATELET
Basophils Relative: 0 % (ref 0–1)
Eosinophils Absolute: 0.2 10*3/uL (ref 0.0–0.7)
Eosinophils Relative: 2 % (ref 0–5)
HCT: 41.6 % (ref 36.0–46.0)
Hemoglobin: 14.8 g/dL (ref 12.0–15.0)
Lymphs Abs: 2.3 10*3/uL (ref 0.7–4.0)
MCH: 32.1 pg (ref 26.0–34.0)
MCHC: 35.6 g/dL (ref 30.0–36.0)
MCV: 90.2 fL (ref 78.0–100.0)
Monocytes Absolute: 0.5 10*3/uL (ref 0.1–1.0)
Monocytes Relative: 6 % (ref 3–12)

## 2012-09-08 LAB — POCT UA - MICROSCOPIC ONLY

## 2012-09-08 MED ORDER — CEFTRIAXONE SODIUM 1 G IJ SOLR
1.0000 g | Freq: Once | INTRAMUSCULAR | Status: AC
  Administered 2012-09-08: 1 g via INTRAMUSCULAR

## 2012-09-08 MED ORDER — CIPROFLOXACIN HCL 500 MG PO TABS: 500.0000 mg | ORAL_TABLET | Freq: Two times a day (BID) | ORAL | Status: DC

## 2012-09-08 MED ORDER — KETOROLAC TROMETHAMINE 60 MG/2ML IM SOLN
60.0000 mg | Freq: Once | INTRAMUSCULAR | Status: AC
  Administered 2012-09-08: 60 mg via INTRAMUSCULAR

## 2012-09-08 NOTE — Telephone Encounter (Signed)
Patient called stating she was told by Xray tech to contact our office because she is feeling lightheaded and dizzy and her mouth is dry. Dr. Ashley Royalty notified and he advises to drink lots of fluids, rest , make sure she has someone with her.

## 2012-09-08 NOTE — Telephone Encounter (Signed)
Message copied by Jennette Bill on Tue Sep 08, 2012 12:18 PM ------      Message from: Everrett Coombe      Created: Tue Sep 08, 2012 12:11 PM       Please inform patient that CT normal.  She should complete course of antibiotics and return if worsening or fails to improve.  Be sure to push fluids as much as possible.

## 2012-09-08 NOTE — Patient Instructions (Addendum)
Pyelonephritis, Adult  Pyelonephritis is a kidney infection. A kidney infection can happen quickly, or it can last for a long time.  HOME CARE    Take your medicine (antibiotics) as told. Finish it even if you start to feel better.   Keep all doctor visits as told.   Drink enough fluids to keep your pee (urine) clear or pale yellow.   Only take medicine as told by your doctor.  GET HELP RIGHT AWAY IF:    You have a fever or lasting symptoms for more than 2-3 days.   You have a fever and your symptoms suddenly get worse.   You cannot take your medicine or drink fluids as told.   You have chills and shaking.   You feel very weak or pass out (faint).   You do not feel better after 2 days.  MAKE SURE YOU:   Understand these instructions.   Will watch your condition.   Will get help right away if you are not doing well or get worse.  Document Released: 09/12/2004 Document Revised: 02/04/2012 Document Reviewed: 01/23/2011  [employer] Patient Information 2013 [employer].

## 2012-09-08 NOTE — Telephone Encounter (Signed)
Attempted to call patient, mailbox is full. If she returns call please read message from Dr Ashley Royalty.Sandra Briggs, Sandra Briggs

## 2012-09-09 ENCOUNTER — Telehealth: Payer: Self-pay | Admitting: Emergency Medicine

## 2012-09-09 DIAGNOSIS — F410 Panic disorder [episodic paroxysmal anxiety] without agoraphobia: Secondary | ICD-10-CM

## 2012-09-09 DIAGNOSIS — M792 Neuralgia and neuritis, unspecified: Secondary | ICD-10-CM

## 2012-09-09 LAB — BASIC METABOLIC PANEL
CO2: 24 mEq/L (ref 19–32)
Chloride: 104 mEq/L (ref 96–112)
Creat: 0.7 mg/dL (ref 0.50–1.10)
Potassium: 4.2 mEq/L (ref 3.5–5.3)
Sodium: 135 mEq/L (ref 135–145)

## 2012-09-09 NOTE — Telephone Encounter (Signed)
Will forward to Dr Booth 

## 2012-09-09 NOTE — Telephone Encounter (Signed)
Pt has appt on 1/30 but will be out of gabapentin, lamictal, hydrocodone & xanax by Sunday this weekend - wants to know what she needs to do

## 2012-09-11 MED ORDER — HYDROCODONE-ACETAMINOPHEN 5-500 MG PO TABS: 1.0000 | ORAL_TABLET | Freq: Three times a day (TID) | ORAL | Status: DC | PRN

## 2012-09-11 MED ORDER — ALPRAZOLAM 2 MG PO TABS: ORAL_TABLET | ORAL | Status: DC

## 2012-09-11 MED ORDER — GABAPENTIN 400 MG PO CAPS: ORAL_CAPSULE | ORAL | Status: DC

## 2012-09-11 MED ORDER — LAMOTRIGINE 200 MG PO TABS: 200.0000 mg | ORAL_TABLET | Freq: Every day | ORAL | Status: DC

## 2012-09-11 NOTE — Telephone Encounter (Signed)
Patient calling again about refills. Dr. Elwyn Reach is on vacation. Will page Dr. Berline Chough now.

## 2012-09-11 NOTE — Telephone Encounter (Signed)
Rx for hydrocodone/ acetomenophen  called to pharmacy.   Pharmacist states they do not make  5/500 mg any longer. Advised to change to 5/325mg . Consulted with Dr. Deirdre Priest and he OK's this. Will sen to Dr. Elwyn Reach to ask her to change on med list.     Also RX for Alprazolam was  not  called in because of current directions of  "1/4 tab  as needed. "  Patient got # 30 tabs on 08/13/2012. Pharmacist needs this clarified.  Dr. Elwyn Reach will be back Monday will send message to her.   I spoke with patient and she states she takes 1/4 tab daily in AM  and will take up to another 3 times during day  at 1/4 tab at the time.

## 2012-09-11 NOTE — Telephone Encounter (Signed)
Will refill all 4 meds for 1 month but pt needs to keep appointment with Dr. Elwyn Reach on 1/30 Discussed with Dr. Tally Due

## 2012-09-13 NOTE — Assessment & Plan Note (Addendum)
Concern for Pyelonephritis due to fever, dysuria and cva tenderness.  UA is not very impressive, however will send for CT to evaluate for obstruction and/or stone.  Will treat as pyelonephritis with 1g ceftriaxone now then 10 day course of cipro.  BMET and CBC today.  Differential includes possible influenza like illness given other symptoms of diarrhea, cough and congestion and cva tenderness could be myalgias due to viral illness.  Advised to follow up in one week if not improving.   Precepted with Dr. Jennette Kettle.

## 2012-09-13 NOTE — Progress Notes (Signed)
  Subjective:    Patient ID: Sandra Briggs, female    DOB: 04-18-1976, 37 y.o.   MRN: [id number]  HPI  1. Cough/Congestion/Flank pain:  Cough and congestion x1 week along with fever, sore throat, nausea and diarrhea.  States that she also has had mid back pain and flank pain on R side x1 week ago.   Reports she has a history of kidney stones.  She had a flu shot this year.     Review of Systems Per HPI    Objective:   Physical Exam  Constitutional: She appears well-nourished. No distress.  HENT:  Head: Normocephalic and atraumatic.  Mouth/Throat: Oropharynx is clear and moist. No oropharyngeal exudate.  Neck: Neck supple.  Cardiovascular: Normal rate and regular rhythm.   Pulmonary/Chest: Effort normal and breath sounds normal.  Abdominal: Soft. Bowel sounds are normal. She exhibits no distension.       R CVA tenderness   Musculoskeletal: She exhibits no edema.          Assessment & Plan:

## 2012-09-17 ENCOUNTER — Ambulatory Visit (INDEPENDENT_AMBULATORY_CARE_PROVIDER_SITE_OTHER): Payer: Self-pay | Admitting: Emergency Medicine

## 2012-09-17 ENCOUNTER — Encounter: Payer: Self-pay | Admitting: Emergency Medicine

## 2012-09-17 VITALS — BP 125/77 | HR 101 | Temp 97.1°F | Ht 64.0 in | Wt 180.4 lb

## 2012-09-17 DIAGNOSIS — J329 Chronic sinusitis, unspecified: Secondary | ICD-10-CM

## 2012-09-17 DIAGNOSIS — F410 Panic disorder [episodic paroxysmal anxiety] without agoraphobia: Secondary | ICD-10-CM

## 2012-09-17 DIAGNOSIS — M542 Cervicalgia: Secondary | ICD-10-CM

## 2012-09-17 MED ORDER — HYDROCODONE-ACETAMINOPHEN 5-500 MG PO TABS: 1.0000 | ORAL_TABLET | Freq: Three times a day (TID) | ORAL | Status: DC | PRN

## 2012-09-17 MED ORDER — ALPRAZOLAM 2 MG PO TABS: ORAL_TABLET | ORAL | Status: DC

## 2012-09-17 NOTE — Progress Notes (Signed)
  Subjective:    Patient ID: Sandra Briggs, female    DOB: [birth year], 37 y.o.   MRN: [id number]  HPI Sandra Briggs is here for med refill and congestion.  Needs refills of her xanax.  Anxiety is well controlled with her current dose.  She requests some additional vicodin for this month and maybe next month as her pain has been worse with the cold weather.  She has been out of xanax for the last few days.  Reports several weeks to months of nasal congestion and cough.  This will start to improve, then worsen.  No fevers since being treated for kidney infection last week.  No purulent nasal discharge.    I have reviewed and updated the following as appropriate: allergies and current medications SHx: former smoker  Review of Systems See HPI    Objective:   Physical Exam BP 125/77  Pulse 101  Temp 97.1 F (36.2 C) (Oral)  Ht 5\' 4"  (1.626 m)  Wt 180 lb 7 oz (81.846 kg)  BMI 30.97 kg/m2 Gen: alert, cooperative, NAD HEENT: AT/[employer], sclera white, MMM, no pharyngeal erythema or edema, TMs mildly retracted (L>R), sinuses mildly tender to palaption on left, no purulent nasal discharge Neck: supple, no LAD CV: RRR, no murmurs Pulm: CTAB, no wheezes or rales     Assessment & Plan:  Follow up in 1 month for refills.

## 2012-09-17 NOTE — Patient Instructions (Addendum)
It was nice to see you! We will increase your vicodin to 45 tablets this month and possibly next month. For the cough and congestion, take Sudafed and mucinex for the next week.  Use nasal saline as often as needed. If you have fevers or start having pus-like nasal drainage, give me a call. Follow up in 1 month for refills.

## 2012-09-17 NOTE — Assessment & Plan Note (Signed)
Stable.  Refill xanax 1mg , 1/4 tab q6hr prn #30.

## 2012-09-17 NOTE — Assessment & Plan Note (Signed)
Recurrent viral infection.  No signs of bacterial infection.  Discussed symptomatic care with sudafed, mucinex and nasal saline.  Reviewed signs of bacterial infection.

## 2012-09-17 NOTE — Assessment & Plan Note (Signed)
Mildly worse with the cold weather.  Will provide vicodin #45 this month and potentially next month.  Back to #30 tablets no later than March.

## 2012-09-18 LAB — DRUG SCREEN, URINE
Barbiturate Quant, Ur: NEGATIVE
Benzodiazepines.: NEGATIVE
Marijuana Metabolite: NEGATIVE
Methadone: NEGATIVE
Propoxyphene: NEGATIVE

## 2012-10-14 ENCOUNTER — Ambulatory Visit (INDEPENDENT_AMBULATORY_CARE_PROVIDER_SITE_OTHER): Payer: Self-pay | Admitting: Family Medicine

## 2012-10-14 ENCOUNTER — Encounter: Payer: Self-pay | Admitting: Family Medicine

## 2012-10-14 VITALS — BP 123/78 | HR 92 | Temp 98.2°F | Ht 64.0 in | Wt 178.3 lb

## 2012-10-14 DIAGNOSIS — F410 Panic disorder [episodic paroxysmal anxiety] without agoraphobia: Secondary | ICD-10-CM

## 2012-10-14 DIAGNOSIS — M542 Cervicalgia: Secondary | ICD-10-CM

## 2012-10-14 MED ORDER — HYDROCODONE-ACETAMINOPHEN 10-325 MG PO TABS: 1.0000 | ORAL_TABLET | Freq: Three times a day (TID) | ORAL | Status: DC | PRN

## 2012-10-14 MED ORDER — ALPRAZOLAM 2 MG PO TABS: ORAL_TABLET | ORAL | Status: DC

## 2012-10-14 NOTE — Patient Instructions (Addendum)
Thank you for coming in today, it was good to see you Be sure to follow up with Dr. Elwyn Reach in the next couple weeks.

## 2012-10-15 ENCOUNTER — Ambulatory Visit (INDEPENDENT_AMBULATORY_CARE_PROVIDER_SITE_OTHER): Payer: Self-pay | Admitting: Emergency Medicine

## 2012-10-15 ENCOUNTER — Encounter: Payer: Self-pay | Admitting: Emergency Medicine

## 2012-10-15 VITALS — BP 136/87 | HR 96 | Ht 64.0 in | Wt 177.5 lb

## 2012-10-15 DIAGNOSIS — M542 Cervicalgia: Secondary | ICD-10-CM

## 2012-10-15 DIAGNOSIS — F410 Panic disorder [episodic paroxysmal anxiety] without agoraphobia: Secondary | ICD-10-CM

## 2012-10-15 MED ORDER — HYDROCODONE-ACETAMINOPHEN 10-325 MG PO TABS: 1.0000 | ORAL_TABLET | Freq: Three times a day (TID) | ORAL | Status: DC | PRN

## 2012-10-15 NOTE — Progress Notes (Signed)
  Subjective:    Patient ID: Sandra Briggs, female    DOB: Jan 29, 1976, 37 y.o.   MRN: [id number]  HPI Sandra Briggs is here for med refill.  She reports being okay with her xanax.  Does need a refill of her norco for neck pain.  She reports that the neuropathic pain is good, but having more pain in her neck.  She attributes this to the cold weather.  Using tylenol and ibuprofen intermittently for this.  Massage helps some.  No limitation in range of motion.  No hand weakness.  Does have some intermittent right arm numbness that is well controlled with gabapentin.    I have reviewed and updated the following as appropriate: allergies, current medications, past family history, past medical history, past social history and past surgical history SHx: former smoker  Review of Systems See HPI    Objective:   Physical Exam BP 136/87  Pulse 96  Ht 5\' 4"  (1.626 m)  Wt 177 lb 8 oz (80.513 kg)  BMI 30.45 kg/m2 Gen: alert, cooperative, NAD HEENT: AT/[place], sclera white, MMM Neck: supple, full AROM, no erythema or obvious deformity, tender along upper thoracic and cervical spine mostly in paraspinous muscles and just lateral to spinous processes Neuro: 5/5 grip strength bilaterally; 2+ biceps reflex bilaterally      Assessment & Plan:

## 2012-10-15 NOTE — Assessment & Plan Note (Signed)
Well controlled with xanax prn.  No refill needed today.

## 2012-10-15 NOTE — Assessment & Plan Note (Signed)
Continued mild flare with cold weather.  Radicular symptoms well controlled.  Will give additional norco #35 for this month.  Back to #30 per month next month.

## 2012-10-15 NOTE — Patient Instructions (Addendum)
It was nice to see you! I think the arthritis in your neck is flared up. For the next 3-5 days take alternate tylenol and ibuprofen every 4 hours to see if we can calm it back down. Follow up in 1 month or sooner as needed.

## 2012-10-19 NOTE — Assessment & Plan Note (Signed)
Discussed with her that her primary care physician should be the one to fill her pain medications.  Since this does not seem to be her fault as she was told it was ok to see me today I will give her a few tablets until she can follow up with her PCP.

## 2012-10-19 NOTE — Progress Notes (Signed)
  Subjective:    Patient ID: Sandra Briggs, female    DOB: 1976-07-06, 37 y.o.   MRN: [id number]  HPI  1. Medication refill:  Here for medication refill because pcp would not be available to refill medication before she ran out. Asking for hydrocodone and xanax refill.  She states she is aware that typically only her pcp will refill controlled substances but the person who made her appointment insisted that it was ok for her to come in today for refills.   Her presciptions were last filled on 09/17/12.  She is currently using hydrocodone for her chronic neck pain. Reports that her symptoms are worsened with cold weather and that her pcp has briefly increased the number of hydrocodone she has been receiving. Reports that her current pain medication does control her pain fairly well.  She reports that she uses xanax for panic attacks and this controls her symptoms well.  She denies any increased anxiety today, worsening pain, or neurological symptoms     Review of Systems Per HPI    Objective:   Physical Exam  Constitutional: She appears well-nourished. No distress.  HENT:  Head: Normocephalic and atraumatic.  Neck: Normal range of motion.  Some reported pain with ROM testing but with full ROM.   Some muscle tightness in upper paraspinals.   Psychiatric: She has a normal mood and affect. Her behavior is normal.          Assessment & Plan:

## 2012-10-19 NOTE — Assessment & Plan Note (Signed)
As with a&p for cervicalgia, will give a few tablets until able to see her pcp.

## 2012-11-04 ENCOUNTER — Encounter: Payer: Self-pay | Admitting: Emergency Medicine

## 2012-11-04 ENCOUNTER — Ambulatory Visit (INDEPENDENT_AMBULATORY_CARE_PROVIDER_SITE_OTHER): Payer: Self-pay | Admitting: Emergency Medicine

## 2012-11-04 VITALS — BP 122/75 | HR 77 | Ht 64.0 in | Wt 182.0 lb

## 2012-11-04 DIAGNOSIS — M542 Cervicalgia: Secondary | ICD-10-CM

## 2012-11-04 DIAGNOSIS — F410 Panic disorder [episodic paroxysmal anxiety] without agoraphobia: Secondary | ICD-10-CM

## 2012-11-04 MED ORDER — HYDROCODONE-ACETAMINOPHEN 10-325 MG PO TABS: 1.0000 | ORAL_TABLET | Freq: Three times a day (TID) | ORAL | Status: DC | PRN

## 2012-11-04 MED ORDER — ALPRAZOLAM 2 MG PO TABS: ORAL_TABLET | ORAL | Status: DC

## 2012-11-04 NOTE — Progress Notes (Signed)
  Subjective:    Patient ID: Sandra Briggs, female    DOB: 1975/11/12, 37 y.o.   MRN: [id number]  HPI BRIDIE COLQUHOUN is here for med refills.  She takes xanax as needed for panic attacks/anxiety.  She reports some months as great, not needed any xanax, and other months are worse.  She uses norco daily as needed for neck and shoulder pain.  This is stable.  No neurologic complaints.  I have reviewed and updated the following as appropriate: allergies and current medications SHx: former smoker  Review of Systems See HPI    Objective:   Physical Exam BP 122/75  Pulse 77  Ht 5\' 4"  (1.626 m)  Wt 182 lb (82.555 kg)  BMI 31.22 kg/m2 Gen: alert, cooperative, NAD HEENT: AT/[place], sclera white, MMM Neck: supple CV: RRR, no murmurs Pulm: CTAB, no wheezes or rales     Assessment & Plan:

## 2012-11-04 NOTE — Assessment & Plan Note (Signed)
Stable.  Will refill xanax #30 tablets.  Follow up in 1 month.

## 2012-11-04 NOTE — Assessment & Plan Note (Addendum)
Stable.  Refill Norco #30 to be filled 30 days from last last refill.  Follow up in 1 month.

## 2012-11-07 ENCOUNTER — Emergency Department (HOSPITAL_BASED_OUTPATIENT_CLINIC_OR_DEPARTMENT_OTHER)
Admission: EM | Admit: 2012-11-07 | Discharge: 2012-11-08 | Disposition: A | Discharge status: Home / Self Care | Payer: Self-pay | Attending: Emergency Medicine | Admitting: Emergency Medicine

## 2012-11-07 ENCOUNTER — Other Ambulatory Visit: Payer: Self-pay

## 2012-11-07 ENCOUNTER — Emergency Department (HOSPITAL_BASED_OUTPATIENT_CLINIC_OR_DEPARTMENT_OTHER): Payer: Self-pay

## 2012-11-07 ENCOUNTER — Encounter (HOSPITAL_BASED_OUTPATIENT_CLINIC_OR_DEPARTMENT_OTHER): Payer: Self-pay | Admitting: *Deleted

## 2012-11-07 DIAGNOSIS — M5412 Radiculopathy, cervical region: Secondary | ICD-10-CM | POA: Insufficient documentation

## 2012-11-07 DIAGNOSIS — F410 Panic disorder [episodic paroxysmal anxiety] without agoraphobia: Secondary | ICD-10-CM | POA: Insufficient documentation

## 2012-11-07 DIAGNOSIS — G8929 Other chronic pain: Secondary | ICD-10-CM | POA: Insufficient documentation

## 2012-11-07 DIAGNOSIS — J45909 Unspecified asthma, uncomplicated: Secondary | ICD-10-CM | POA: Insufficient documentation

## 2012-11-07 DIAGNOSIS — Z79899 Other long term (current) drug therapy: Secondary | ICD-10-CM | POA: Insufficient documentation

## 2012-11-07 DIAGNOSIS — M79609 Pain in unspecified limb: Secondary | ICD-10-CM | POA: Insufficient documentation

## 2012-11-07 DIAGNOSIS — Z8781 Personal history of (healed) traumatic fracture: Secondary | ICD-10-CM | POA: Insufficient documentation

## 2012-11-07 DIAGNOSIS — Z3202 Encounter for pregnancy test, result negative: Secondary | ICD-10-CM | POA: Insufficient documentation

## 2012-11-07 DIAGNOSIS — Z87891 Personal history of nicotine dependence: Secondary | ICD-10-CM | POA: Insufficient documentation

## 2012-11-07 DIAGNOSIS — R109 Unspecified abdominal pain: Secondary | ICD-10-CM | POA: Insufficient documentation

## 2012-11-07 DIAGNOSIS — Z87448 Personal history of other diseases of urinary system: Secondary | ICD-10-CM | POA: Insufficient documentation

## 2012-11-07 LAB — CBC WITH DIFFERENTIAL/PLATELET
Basophils Absolute: 0 10*3/uL (ref 0.0–0.1)
Lymphocytes Relative: 27 % (ref 12–46)
Lymphs Abs: 2.8 10*3/uL (ref 0.7–4.0)
Neutro Abs: 6.5 10*3/uL (ref 1.7–7.7)
Platelets: 208 10*3/uL (ref 150–400)
RBC: 4.59 MIL/uL (ref 3.87–5.11)
RDW: 11.4 % — ABNORMAL LOW (ref 11.5–15.5)
WBC: 10.5 10*3/uL (ref 4.0–10.5)

## 2012-11-07 LAB — PREGNANCY, URINE: Preg Test, Ur: NEGATIVE

## 2012-11-07 NOTE — ED Notes (Signed)
Pt c/o chest feeling heavy and left arm pain onset earlier today. +nausea. Taken to ED9. EKG being done.

## 2012-11-08 ENCOUNTER — Emergency Department (HOSPITAL_BASED_OUTPATIENT_CLINIC_OR_DEPARTMENT_OTHER): Payer: Self-pay

## 2012-11-08 LAB — COMPREHENSIVE METABOLIC PANEL
ALT: 31 U/L (ref 0–35)
AST: 26 U/L (ref 0–37)
Albumin: 4.3 g/dL (ref 3.5–5.2)
Alkaline Phosphatase: 79 U/L (ref 39–117)
Chloride: 102 mEq/L (ref 96–112)
Potassium: 3.8 mEq/L (ref 3.5–5.1)
Sodium: 141 mEq/L (ref 135–145)
Total Bilirubin: 0.2 mg/dL — ABNORMAL LOW (ref 0.3–1.2)

## 2012-11-08 LAB — TROPONIN I: Troponin I: <0.3 ng/mL (ref <0.30)

## 2012-11-08 MED ORDER — KETOROLAC TROMETHAMINE 30 MG/ML IJ SOLN
30.0000 mg | Freq: Once | INTRAMUSCULAR | Status: AC
  Administered 2012-11-08: 30 mg via INTRAVENOUS
  Filled 2012-11-08: qty 1

## 2012-11-08 MED ORDER — MELOXICAM 7.5 MG PO TABS: 7.5000 mg | ORAL_TABLET | Freq: Every day | ORAL | Status: DC

## 2012-11-08 MED ORDER — METHOCARBAMOL 500 MG PO TABS: 500.0000 mg | ORAL_TABLET | Freq: Two times a day (BID) | ORAL | Status: DC

## 2012-11-08 MED ORDER — HYDROCODONE-ACETAMINOPHEN 5-325 MG PO TABS: 1.0000 | ORAL_TABLET | Freq: Four times a day (QID) | ORAL | Status: DC | PRN

## 2012-11-08 MED ORDER — METHOCARBAMOL 500 MG PO TABS
1000.0000 mg | ORAL_TABLET | Freq: Once | ORAL | Status: AC
  Administered 2012-11-08: 1000 mg via ORAL
  Filled 2012-11-08: qty 1

## 2012-11-08 MED ORDER — METHOCARBAMOL 500 MG PO TABS
ORAL_TABLET | ORAL | Status: AC
  Filled 2012-11-08: qty 1

## 2012-11-08 NOTE — ED Provider Notes (Signed)
History    This chart was scribed for Ellizabeth Dacruz Smitty Cords, MD scribed by Magnus Sinning. The patient was seen in room MH09/MH09 at 00.05   CSN: [id number]  Arrival date & time 11/07/12  2313     Chief Complaint  Patient presents with  . Chest Pain    (Consider location/radiation/quality/duration/timing/severity/associated sxs/prior treatment) Patient is a 37 y.o. female presenting with chest pain. The history is provided by the patient. No language interpreter was used.  Chest Pain Pain location:  L chest Pain quality: dull   Pain radiates to:  L arm Pain radiates to the back: no   Pain severity:  Moderate Onset quality:  Gradual Duration:  9 hours Timing:  Constant Progression:  Unchanged Chronicity:  New Context: at rest   Relieved by:  Nothing Worsened by:  Nothing tried Ineffective treatments:  None tried Associated symptoms: no cough, no fever and no shortness of breath    Signa R Remillard is a 37 y.o. female who presents to the Emergency Department complaining of constant moderate CP with associated left arm pain, onset 9 hours ago and HA since over 12 hours ago yesterday morning.  The patient states she had a HA earlier today. She notes this resolved and that later she started having left arm pain and chest pain described as heaviness and pressure.  She reports hx of GERD and arthritis in neck, which she treats with neurontin. Recently started at the gym Past Medical History  Diagnosis Date  . Exercise-induced asthma   . Panic disorder   . Dysuria-frequency syndrome   . Chronic pain     flank  . Sprain of MCL (medial collateral ligament) of knee     Past Surgical History  Procedure Laterality Date  . Tubal ligation  06/28/2005  . Endometrial ablation  2009  . Cesarean section    . Appendectomy      Family History  Problem Relation Age of Onset  . Lung cancer Mother   . Fibroids Sister   . Hyperlipidemia Maternal Grandmother     History  Substance Use  Topics  . Smoking status: Former Smoker    Types: Cigarettes    Quit date: 02/27/2011  . Smokeless tobacco: Former Neurosurgeon    Quit date: 10/22/2010  . Alcohol Use: 0.0 oz/week     Comment: rare   Review of Systems  Constitutional: Negative for fever.  HENT: Negative for neck stiffness.   Respiratory: Negative for cough and shortness of breath.   Cardiovascular: Positive for chest pain.  Musculoskeletal:       Left arm pain  All other systems reviewed and are negative.    Allergies  Sulfonamide derivatives and Tramadol hcl  Home Medications   Current Outpatient Rx  Name  Route  Sig  Dispense  Refill  . acetaminophen (TYLENOL) 500 MG tablet   Oral   Take 1,000 mg by mouth every 6 (six) hours as needed. Patient used this medication for neck pain.         Marland Kitchen alprazolam (XANAX) 2 MG tablet      Take 1/4 tab every 6 hours as needed for anxiety   30 tablet   0     Do not fill until 30 days from last refill.   . gabapentin (NEURONTIN) 400 MG capsule      1 tab in am and afternoon, 2 tabs at night   120 capsule   0   . HYDROcodone-acetaminophen (NORCO) 10-325 MG per tablet  Oral   Take 1 tablet by mouth every 8 (eight) hours as needed for pain.   30 tablet   0     Fill 30 days after last refill.   Marland Kitchen EXPIRED: ibuprofen (ADVIL,MOTRIN) 800 MG tablet   Oral   Take 800 mg by mouth every 8 (eight) hours as needed. Patient uses this medication for pain.         Marland Kitchen lamoTRIgine (LAMICTAL) 200 MG tablet   Oral   Take 1 tablet (200 mg total) by mouth daily. Taking for bi-polar disorder   60 tablet   0     BP 124/97  Pulse 118  Temp(Src) 97.8 F (36.6 C) (Oral)  Resp 18  Ht 5\' 4"  (1.626 m)  Wt 182 lb (82.555 kg)  BMI 31.22 kg/m2  SpO2 98%  Physical Exam  Nursing note and vitals reviewed. Constitutional: She is oriented to person, place, and time. She appears well-developed and well-nourished. No distress.  HENT:  Head: Normocephalic and atraumatic.  Eyes:  Conjunctivae and EOM are normal. Pupils are equal, round, and reactive to light.  Neck: Normal range of motion. Neck supple. No tracheal deviation present.  Cardiovascular: Normal rate, regular rhythm and normal heart sounds.   No murmur heard. Pulmonary/Chest: Effort normal and breath sounds normal. No respiratory distress. She has no wheezes. She has no rales.  Abdominal: Soft. Bowel sounds are normal. She exhibits no distension. There is no tenderness. There is no rebound and no guarding.  Musculoskeletal: Normal range of motion. She exhibits no edema and no tenderness.  No crepitus and no midline c-spine tenderness Papable spasm in the left trapezius. No winging of the scapula. Negative Neer's test 5 out of 5 bilateral upper extremity strength Intact DP pulse 2 + upper reflexes  Lymphadenopathy:    She has no cervical adenopathy.  Neurological: She is alert and oriented to person, place, and time. She has normal reflexes. She displays normal reflexes. No sensory deficit.  Skin: Skin is warm and dry. No rash noted.  Psychiatric: She has a normal mood and affect. Her behavior is normal.    ED Course  Procedures (including critical care time) DIAGNOSTIC STUDIES: Oxygen Saturation is 98% on room air, normal by my interpretation.    COORDINATION OF CARE: 00:07: Physical exam performed  Labs Reviewed  CBC WITH DIFFERENTIAL - Abnormal; Notable for the following:    RDW 11.4 (*)    All other components within normal limits  COMPREHENSIVE METABOLIC PANEL - Abnormal; Notable for the following:    Total Bilirubin 0.2 (*)    All other components within normal limits  TROPONIN I  D-DIMER, QUANTITATIVE  PREGNANCY, URINE   Dg Chest 2 View  11/08/2012  *RADIOLOGY REPORT*  Clinical Data: Chest pain  CHEST - 2 VIEW  Comparison: 01/28/2008  Findings: Lungs are predominately clear with minimal linear left lung base opacity, likely atelectasis or scarring. No pleural effusion or pneumothorax.  The cardiomediastinal contours are within normal limits. The visualized bones and soft tissues are without significant appreciable abnormality.  IMPRESSION: No radiographic evidence of acute cardiopulmonary process.   Original Report Authenticated By: Jearld Lesch, M.D.    Dg Cervical Spine Complete  11/08/2012  *RADIOLOGY REPORT*  Clinical Data: Neck pain  CERVICAL SPINE - COMPLETE 4+ VIEW  Comparison: 02/19/2012 CT  Findings: C5-6 degenerative disc disease and anterior osteophyte formation, similar to prior.  Maintained vertebral body height and alignment.  No acute fracture.  Patent neural foramen.  Maintained C1-2 articulation.  No dens fracture.  Lung apices are clear.  IMPRESSION: C5-6 degenerative disc disease.  No acute osseous finding.   Original Report Authenticated By: Jearld Lesch, M.D.      No diagnosis found.    MDM  Symptoms > 8 hours duration with negative EKG and troponin sufficient to exclude ACS.  Symptoms consistent with radicular pain and spasm.  Will treat for same.  Follow up with your family doctor for ongoing care.     I personally performed the services described in this documentation, which was scribed in my presence. The recorded information has been reviewed and is accurate.     Date: 11/08/2012  Rate: 97  Rhythm: normal sinus rhythm  QRS Axis: normal  Intervals: normal  ST/T Wave abnormalities: normal  Conduction Disutrbances: none  Narrative Interpretation: unremarkable          Jakirah Zaun K Eveline Sauve-Rasch, MD 11/08/12 1610

## 2012-12-11 ENCOUNTER — Ambulatory Visit (INDEPENDENT_AMBULATORY_CARE_PROVIDER_SITE_OTHER): Payer: Self-pay | Admitting: Emergency Medicine

## 2012-12-11 VITALS — BP 128/86 | HR 90 | Ht 64.0 in | Wt 180.0 lb

## 2012-12-11 DIAGNOSIS — F410 Panic disorder [episodic paroxysmal anxiety] without agoraphobia: Secondary | ICD-10-CM

## 2012-12-11 DIAGNOSIS — M542 Cervicalgia: Secondary | ICD-10-CM

## 2012-12-11 MED ORDER — HYDROCODONE-ACETAMINOPHEN 10-325 MG PO TABS: 1.0000 | ORAL_TABLET | Freq: Three times a day (TID) | ORAL | Status: DC | PRN

## 2012-12-11 MED ORDER — CYCLOBENZAPRINE HCL 5 MG PO TABS: 5.0000 mg | ORAL_TABLET | Freq: Three times a day (TID) | ORAL | Status: DC | PRN

## 2012-12-11 NOTE — Patient Instructions (Addendum)
It was nice to see you! Try taking the flexeril 1-2 times a day over the weekend, if you can, to try and knock out the spasm. Gentle stretching is good. Heating pad and icy-hot is also good. Follow up in 1 month.

## 2012-12-11 NOTE — Progress Notes (Signed)
  Subjective:    Patient ID: Sandra Briggs, female    DOB: 1976-03-09, 37 y.o.   MRN: [id number]  HPI Yacine R Bergthold is here for med refill.  1. Anxiety: Reports doing really well with this except for her ER visit on 3/22.  She reports having 10-12 xanax left and does not need a refill today.  2. Neck pain: Was seen in the ED 3/22 for flare of pain with lots of muscle spasm.  Given a few extra vicodin, meloxicam, and robaxin.  States the robaxin upsets her stomach and doesn't really help.  The pain is much better now, but still a little worse than normal.  States the left shoulder feels tight.  Does get occasional brief shooting pains to elbow.  No weakness or numbness in the hand.  I have reviewed and updated the following as appropriate: allergies and current medications SHx: former smoker  Review of Systems See HPI    Objective:   Physical Exam BP 128/86  Pulse 90  Ht 5\' 4"  (1.626 m)  Wt 180 lb (81.647 kg)  BMI 30.88 kg/m2 Gen: alert, cooperative, NAD HEENT: AT/[place], sclera white, MMM Neck: supple, tender along superior edge of left trapezius, mild to moderate spasm noted in left trapezius CV: RRR, no murmurs Pulm: CTAB, no wheezes or rales Ext: no edema in left arm, good radial pulse Neuro: 5/5 grip, triceps and biceps strength in bilateral upper extremities     Assessment & Plan:

## 2012-12-11 NOTE — Assessment & Plan Note (Addendum)
Doing well now.  Seen in the ED 3/22 for worsening spasm.  This is improved.  Robaxin not helping and causing GI upset, will switch to flexeril.  Discussed conservative treatment with heat, gentle stretching and icy-hot as well.  Norco 10-325 #30 refilled for the month.

## 2012-12-11 NOTE — Assessment & Plan Note (Signed)
Stable.  Flared with ED visit.  No refill of xanax needed today.

## 2013-01-06 ENCOUNTER — Ambulatory Visit (INDEPENDENT_AMBULATORY_CARE_PROVIDER_SITE_OTHER): Payer: Self-pay | Admitting: Emergency Medicine

## 2013-01-06 ENCOUNTER — Encounter: Payer: Self-pay | Admitting: Emergency Medicine

## 2013-01-06 VITALS — BP 127/84 | HR 88 | Ht 64.0 in | Wt 185.0 lb

## 2013-01-06 DIAGNOSIS — M542 Cervicalgia: Secondary | ICD-10-CM

## 2013-01-06 DIAGNOSIS — F410 Panic disorder [episodic paroxysmal anxiety] without agoraphobia: Secondary | ICD-10-CM

## 2013-01-06 MED ORDER — HYDROCODONE-ACETAMINOPHEN 10-325 MG PO TABS: 1.0000 | ORAL_TABLET | Freq: Three times a day (TID) | ORAL | Status: DC | PRN

## 2013-01-06 MED ORDER — ALPRAZOLAM 2 MG PO TABS: ORAL_TABLET | ORAL | Status: DC

## 2013-01-06 NOTE — Patient Instructions (Addendum)
It was nice to see you! Please get the x-ray sometime this week.  Just go to the radiology department at [facility]. I will call you if something is wrong, otherwise, we will discuss the x-ray at follow up.  Follow up in 1-2 weeks.

## 2013-01-06 NOTE — Progress Notes (Signed)
  Subjective:    Patient ID: Sandra Briggs, female    DOB: 07-29-1976, 37 y.o.   MRN: [id number]  HPI Sandra Briggs is here for med refills.  She takes xanax prn for panic attacks and norco for neck pain.  She reports her anxiety is doing pretty well, but has been a little worse since her neck pain has flared up.  She reports that for the last month, her neck pain has been worse.  It is in the back of her neck, feels a grinding when she turns her head, feels like her neck muscles are really tight.  Does also have a "heavy" pain in her left upper arm.  No radicular symptoms.  Always has some numbness in her left arm, but nothing new.   I have reviewed and updated the following as appropriate: allergies and current medications SHx: former smoker  Review of Systems See HPI    Objective:   Physical Exam BP 127/84  Pulse 88  Ht 5\' 4"  (1.626 m)  Wt 185 lb (83.915 kg)  BMI 31.74 kg/m2 Gen: alert, cooperative, NAD HEENT: AT/[place], sclera white, MMM Neck: full ROM but pain with extremes, particularly turning the head; tender to palpation along bilateral trapezius; no vertebral tenderness Neuro: 5/5 strength in bilateral upper arms     Assessment & Plan:

## 2013-01-06 NOTE — Assessment & Plan Note (Addendum)
Given worsening and report of grinding, will repeat cervical spine films.  No radicular signs.  Refilled norco #30.  Follow up 2 weeks.

## 2013-01-06 NOTE — Assessment & Plan Note (Signed)
Stable overall.  Refilled xanax #30.

## 2013-01-08 ENCOUNTER — Telehealth: Payer: Self-pay | Admitting: Emergency Medicine

## 2013-01-08 NOTE — Telephone Encounter (Signed)
Sandra Briggs called to say she was unable to have her xray done today because her supervisor had to leave early due to illness.  Will take care of it on Monday.

## 2013-01-09 NOTE — Telephone Encounter (Signed)
That is fine.  I would like the x-ray within the next week, but there is no urgency.

## 2013-01-27 ENCOUNTER — Encounter: Payer: Self-pay | Admitting: Emergency Medicine

## 2013-01-27 ENCOUNTER — Ambulatory Visit (INDEPENDENT_AMBULATORY_CARE_PROVIDER_SITE_OTHER): Payer: Self-pay | Admitting: Emergency Medicine

## 2013-01-27 VITALS — BP 119/81 | HR 99 | Ht 64.0 in | Wt 182.0 lb

## 2013-01-27 DIAGNOSIS — M542 Cervicalgia: Secondary | ICD-10-CM

## 2013-01-27 MED ORDER — HYDROCODONE-ACETAMINOPHEN 10-325 MG PO TABS: 1.0000 | ORAL_TABLET | Freq: Three times a day (TID) | ORAL | Status: DC | PRN

## 2013-01-27 MED ORDER — CYCLOBENZAPRINE HCL 5 MG PO TABS
5.0000 mg | ORAL_TABLET | Freq: Three times a day (TID) | ORAL | Status: DC | PRN
Start: 2013-01-27 — End: 2013-02-11

## 2013-01-27 NOTE — Assessment & Plan Note (Signed)
Worsening, now with radicular symptoms.  + [place].  I am concerned about nerve impingement.  Will get cervical and thoracic films.  Continue flexeril.  Increase gabapentin to 400mg  TID.  Will provide early refill of Norco.  Follow up in 1-2 weeks.  May need to get MRI.

## 2013-01-27 NOTE — Patient Instructions (Addendum)
It was nice to see you! I'm worried that a nerve is being pinched in your neck. Increase your gabapentin to 1 tablet 3 times a day. Continue flexeril. I have given you some extra Norco to use. Get the x-rays today or tomorrow. Follow up with me in 1-2 weeks to talk about the next step.

## 2013-01-27 NOTE — Progress Notes (Signed)
  Subjective:    Patient ID: Sandra Briggs, female    DOB: Jun 20, 1976, 37 y.o.   MRN: [id number]  HPI Sandra Briggs is here for neck/arm pain.  She has a history of cervicalgia previously well controlled on gabapentin 400mg  BID, norco 10-325 daily prn.  She described worsening at our last appointment 2 weeks.  She states that the flexeril helps with her "normal" pain.  However, she continues to have pain and "crunching" feeling in her neck with flexion and extension.  Also with a shooting pain down the outside of her left arm to the elbow.  She has been taking her norco twice a day due to the pain.  Denies weakness.  Has been unable to get the x-rays ordered last time.  I have reviewed and updated the following as appropriate: allergies and current medications SHx: former smoker  Review of Systems See HPI    Objective:   Physical Exam BP 119/81  Pulse 99  Ht 5\' 4"  (1.626 m)  Wt 182 lb (82.555 kg)  BMI 31.22 kg/m2 Gen: appears uncomfortable, alert, oriented Neck: supple; mild tenderness over C8/T1; tender in left trapezius; + Spurlings on the left Neuro: 5-/5 strength in left grip, biceps and triceps; biceps and triceps reflexes 1+ and symmetric; sensations is grossly intact     Assessment & Plan:

## 2013-02-10 ENCOUNTER — Ambulatory Visit (HOSPITAL_COMMUNITY)
Admission: RE | Admit: 2013-02-10 | Discharge: 2013-02-10 | Disposition: A | Discharge status: Home / Self Care | Payer: Self-pay | Source: Ambulatory Visit | Attending: Family Medicine | Admitting: Family Medicine

## 2013-02-10 DIAGNOSIS — M898X9 Other specified disorders of bone, unspecified site: Secondary | ICD-10-CM | POA: Insufficient documentation

## 2013-02-10 DIAGNOSIS — M549 Dorsalgia, unspecified: Secondary | ICD-10-CM | POA: Insufficient documentation

## 2013-02-10 DIAGNOSIS — R29890 Loss of height: Secondary | ICD-10-CM | POA: Insufficient documentation

## 2013-02-10 DIAGNOSIS — M542 Cervicalgia: Secondary | ICD-10-CM

## 2013-02-11 ENCOUNTER — Encounter: Payer: Self-pay | Admitting: Emergency Medicine

## 2013-02-11 ENCOUNTER — Ambulatory Visit (INDEPENDENT_AMBULATORY_CARE_PROVIDER_SITE_OTHER): Payer: Self-pay | Admitting: Emergency Medicine

## 2013-02-11 VITALS — BP 131/84 | HR 93 | Ht 64.0 in | Wt 185.0 lb

## 2013-02-11 DIAGNOSIS — F410 Panic disorder [episodic paroxysmal anxiety] without agoraphobia: Secondary | ICD-10-CM

## 2013-02-11 DIAGNOSIS — M542 Cervicalgia: Secondary | ICD-10-CM

## 2013-02-11 MED ORDER — ALPRAZOLAM 2 MG PO TABS: ORAL_TABLET | ORAL | Status: DC

## 2013-02-11 MED ORDER — HYDROCODONE-ACETAMINOPHEN 10-325 MG PO TABS: 1.0000 | ORAL_TABLET | Freq: Three times a day (TID) | ORAL | Status: DC | PRN

## 2013-02-11 MED ORDER — CYCLOBENZAPRINE HCL 5 MG PO TABS: 5.0000 mg | ORAL_TABLET | Freq: Three times a day (TID) | ORAL | Status: DC | PRN

## 2013-02-11 NOTE — Progress Notes (Signed)
  Subjective:    Patient ID: Sandra Briggs, female    DOB: [birth year], 37 y.o.   MRN: [id number]  HPI Davon R Ruggiero is here for f/u neck pain.  She reports that her neck and arm symptoms are no better.  Continues to get shooting pains down the back of her left arm with certain movements, particularly turning her head.  Has a constant ache in her neck and into the left shoulder.  Denies weakness, but does state that she has been dropping things in her left hand.  Describes worsening pins and needle sensation in her left hand.  She is left handed.  I have reviewed and updated the following as appropriate: allergies and current medications SHx: former smoker  Review of Systems See HPI    Objective:   Physical Exam BP 131/84  Pulse 93  Ht 5\' 4"  (1.626 m)  Wt 185 lb (83.915 kg)  BMI 31.74 kg/m2 Gen: alert, cooperative, NAD Neck: diffusely tender along cervical spine and bilateral trapezius (worse on the left); + spurlings on left; palpation of left trapezius reproduces shooting pain down left arm Neuro: diminished but symmetric biceps and triceps reflexes; sensation grossly intact; left grip weak compared to right     Assessment & Plan:

## 2013-02-11 NOTE — Addendum Note (Signed)
Addended by: Phebe Colla on: 02/11/2013 12:14 PM   Modules accepted: Orders

## 2013-02-11 NOTE — Assessment & Plan Note (Signed)
With radicular pain and weakness as well as positive spurlings, I am concerned about worsening nerve impingement.  Plain films are unchanged.  Will get MRI of cervical spine.  Refilled norco #45.

## 2013-02-11 NOTE — Patient Instructions (Addendum)
It was nice to see you! We are going to get an MRI of your neck to see if anything has changed. Follow up with me 1 week after the MRI.

## 2013-03-10 ENCOUNTER — Encounter: Payer: Self-pay | Admitting: Emergency Medicine

## 2013-03-10 ENCOUNTER — Ambulatory Visit (INDEPENDENT_AMBULATORY_CARE_PROVIDER_SITE_OTHER): Payer: Self-pay | Admitting: Emergency Medicine

## 2013-03-10 VITALS — BP 131/84 | HR 84 | Ht 64.0 in | Wt 187.0 lb

## 2013-03-10 DIAGNOSIS — M792 Neuralgia and neuritis, unspecified: Secondary | ICD-10-CM

## 2013-03-10 DIAGNOSIS — IMO0002 Reserved for concepts with insufficient information to code with codable children: Secondary | ICD-10-CM

## 2013-03-10 DIAGNOSIS — M542 Cervicalgia: Secondary | ICD-10-CM

## 2013-03-10 DIAGNOSIS — F319 Bipolar disorder, unspecified: Secondary | ICD-10-CM

## 2013-03-10 MED ORDER — GABAPENTIN 300 MG PO CAPS: 600.0000 mg | ORAL_CAPSULE | Freq: Three times a day (TID) | ORAL | Status: DC

## 2013-03-10 MED ORDER — HYDROCODONE-ACETAMINOPHEN 10-325 MG PO TABS: 1.0000 | ORAL_TABLET | Freq: Three times a day (TID) | ORAL | Status: DC | PRN

## 2013-03-10 MED ORDER — CYCLOBENZAPRINE HCL 5 MG PO TABS
5.0000 mg | ORAL_TABLET | Freq: Three times a day (TID) | ORAL | Status: DC | PRN
Start: 2013-03-10 — End: 2016-08-27

## 2013-03-10 MED ORDER — LAMOTRIGINE 200 MG PO TABS: 200.0000 mg | ORAL_TABLET | Freq: Every day | ORAL | Status: DC

## 2013-03-10 NOTE — Assessment & Plan Note (Signed)
Stable. Refilled lamictal today. Unable to afford to see Dr. Pascal Lux at this time.

## 2013-03-10 NOTE — Progress Notes (Signed)
  Subjective:    Patient ID: Sandra Briggs, female    DOB: 1976/05/20, 37 y.o.   MRN: [id number]  HPI Sandra Briggs is here for follow up of neck pain.  Neck pain This is long standing secondary to cervical disc degeneration.  Several months ago it started to worsen with pain in the left neck and worsening numbness down the left lateral arm.  Over the last 2 months, she has started to notice some weakness, worse in the last month.  Has been dropping things, particularly soda cans.  Feels like the thumb in particular fatigues easily.  The numbness goes down the lateral arm and will involve the thumb and first 2 fingers.  Never any numbness in the pinkie or ring fingers. Worse with any sort of repetitive movements and overhead movement.  She is left handed.  No swelling or erythema.  Bipolar She states she needs refills on her lamictal today.  She reports stable mood.  Has seen Dr. Pascal Lux in the past, but cannot currently afford to see her.   I have reviewed and updated the following as appropriate: allergies, current medications and problem list SHx: former smoker  Review of Systems See HPI    Objective:   Physical Exam BP 131/84  Pulse 84  Ht 5\' 4"  (1.626 m)  Wt 187 lb (84.823 kg)  BMI 32.08 kg/m2 Gen: alert, cooperative, mildly anxious when discussing arm Left arm: sensation grossly intact to light touch; no erythema or edema; grip weak on the left compared to right; 5-/5 strength in thumb opposition; 5/5 interosseus strength; 5/5 wrist extension strength      Assessment & Plan:

## 2013-03-10 NOTE — Patient Instructions (Addendum)
It was nice to see you!  Please try and get the MRI in the next month.  I am concerned that something is pinching a nerve in your neck.  Follow up in 1 month or 1 week after the MRI, whichever is sooner.

## 2013-03-10 NOTE — Assessment & Plan Note (Signed)
I am concerned about nerve impingement given her worsening of numbness and weakness. Cervical films are stable. MRI ordered and she is working on getting the money to have it done at [facility]. Refilled norco 10-325mg  #45. Increase gabapentin to 600mg  TID. Follow up after MRI or in 1 month.

## 2013-04-08 ENCOUNTER — Ambulatory Visit: Payer: Self-pay

## 2013-04-12 ENCOUNTER — Encounter: Payer: Self-pay | Admitting: Emergency Medicine

## 2013-04-12 ENCOUNTER — Ambulatory Visit (INDEPENDENT_AMBULATORY_CARE_PROVIDER_SITE_OTHER): Payer: Self-pay | Admitting: Emergency Medicine

## 2013-04-12 VITALS — BP 115/56 | HR 92 | Ht 64.0 in | Wt 187.0 lb

## 2013-04-12 DIAGNOSIS — F410 Panic disorder [episodic paroxysmal anxiety] without agoraphobia: Secondary | ICD-10-CM

## 2013-04-12 DIAGNOSIS — M542 Cervicalgia: Secondary | ICD-10-CM

## 2013-04-12 MED ORDER — HYDROCODONE-ACETAMINOPHEN 10-325 MG PO TABS: 1.0000 | ORAL_TABLET | Freq: Three times a day (TID) | ORAL | Status: DC | PRN

## 2013-04-12 MED ORDER — ALPRAZOLAM 2 MG PO TABS: ORAL_TABLET | ORAL | Status: DC

## 2013-04-12 NOTE — Assessment & Plan Note (Signed)
Refilled xanax 2mg  #30 tablets.   Did not get refill last month.

## 2013-04-12 NOTE — Progress Notes (Signed)
  Subjective:    Patient ID: Sandra Briggs, female    DOB: Dec 25, 1975, 37 y.o.   MRN: [id number]  HPI Alesha R Haseley is here for med refill.  Anxiety She has a long history of panic disorder.  Has been doing very well. Still has a few tablets left from her June refill.    Cervical radiculopathy She reports that the numbness and "pinching" is better with the increased dose of gabapentin.  She still has some shooting pains.  Denies feeling weak in the left.  Does have worsening of pain with overhead movements, but is able to use her full range of motion.  I have reviewed and updated the following as appropriate: allergies and current medications SHx: former smoker  Review of Systems See HPI    Objective:   Physical Exam BP 115/56  Pulse 92  Ht 5\' 4"  (1.626 m)  Wt 187 lb (84.823 kg)  BMI 32.08 kg/m2 Gen: alert, cooperative, NAD Neuro: sensation grossly intact to light touch in bilateral upper extremities; 5/5 strength on right; 5-/5 on left grip, biceps and triceps     Assessment & Plan:

## 2013-04-12 NOTE — Patient Instructions (Addendum)
It was nice to see you!  You can increase the Neurontin up to 3 pills 3 times a day.  I will let you decide if you need to do this.  We do not need the MRI immediately.Marland KitchenMarland KitchenI will tell you if we need to get it right away.  Follow up in 1 month.

## 2013-04-12 NOTE — Assessment & Plan Note (Signed)
Stable. I am still concerned about worsening impingement; however, she reports doing better on increased dose of gabapentin. Discussed with patient that we do not need the MRI immediately - if her strength declines, will need to get it right away. Also discussed that she can increase gabapentin to 900mg  TID if needed - I will leave this titration up to her. Follow up in 1 month.

## 2013-05-14 ENCOUNTER — Ambulatory Visit (INDEPENDENT_AMBULATORY_CARE_PROVIDER_SITE_OTHER): Payer: Self-pay | Admitting: Emergency Medicine

## 2013-05-14 ENCOUNTER — Encounter: Payer: Self-pay | Admitting: Emergency Medicine

## 2013-05-14 VITALS — BP 117/80 | HR 80 | Temp 98.7°F | Ht 64.0 in | Wt 187.0 lb

## 2013-05-14 DIAGNOSIS — F410 Panic disorder [episodic paroxysmal anxiety] without agoraphobia: Secondary | ICD-10-CM

## 2013-05-14 DIAGNOSIS — M542 Cervicalgia: Secondary | ICD-10-CM

## 2013-05-14 DIAGNOSIS — Z23 Encounter for immunization: Secondary | ICD-10-CM

## 2013-05-14 MED ORDER — ALPRAZOLAM 2 MG PO TABS: ORAL_TABLET | ORAL | Status: DC

## 2013-05-14 MED ORDER — HYDROCODONE-ACETAMINOPHEN 10-325 MG PO TABS: 1.0000 | ORAL_TABLET | Freq: Three times a day (TID) | ORAL | Status: DC | PRN

## 2013-05-14 NOTE — Progress Notes (Signed)
  Subjective:    Patient ID: Sandra Briggs, female    DOB: July 01, 1976, 37 y.o.   MRN: [id number]  HPI Sandra Briggs is here for med refill.  Neck pain Reports this is stable to slightly improved.  Reports definite improved in numbness with the increased gabapentin dose.  Still gets the achy pain and shooting pains.  Has some slightly decreased grip strength on the left, but this is stable from last month.  Panic disorder Reports this is a little better with the gabapentin increase.  Has some pills left from last time and thinks may have some at home as well.  I have reviewed and updated the following as appropriate: allergies and current medications SHx: former smoker  Review of Systems See HPI    Objective:   Physical Exam BP 117/80  Pulse 80  Temp(Src) 98.7 F (37.1 C) (Oral)  Ht 5\' 4"  (1.626 m)  Wt 187 lb (84.823 kg)  BMI 32.08 kg/m2 Gen: alert, cooperative, NAD HEENT: AT/[place], sclera white, MMM Neck: supple CV: RRR, no murmurs Pulm: CTAB, no wheezes or rales Left arm: 5/5 triceps and biceps; 5-/5 grip      Assessment & Plan:

## 2013-05-14 NOTE — Assessment & Plan Note (Signed)
Stable to slightly improved on increased gabapentin dose. Continue with gabapentin 600mg  TID. Norco #45 provided today. Discussed posture and sleep positioning as well. Follow up in 1 month.  If stable, will likely space visits to every 2-3 months.

## 2013-05-14 NOTE — Assessment & Plan Note (Signed)
Improved a little with the increased gabapentin. Xanax #30 provided today.

## 2013-05-14 NOTE — Patient Instructions (Addendum)
It was nice to see you!  Work on your posture and make sure your neck is straight when you sleep.  Follow up in 1 month.

## 2013-06-10 ENCOUNTER — Ambulatory Visit (INDEPENDENT_AMBULATORY_CARE_PROVIDER_SITE_OTHER): Payer: Self-pay | Admitting: Emergency Medicine

## 2013-06-10 ENCOUNTER — Encounter: Payer: Self-pay | Admitting: Emergency Medicine

## 2013-06-10 VITALS — BP 115/79 | HR 76 | Temp 98.1°F | Ht 64.0 in | Wt 184.0 lb

## 2013-06-10 DIAGNOSIS — R350 Frequency of micturition: Secondary | ICD-10-CM

## 2013-06-10 DIAGNOSIS — M542 Cervicalgia: Secondary | ICD-10-CM

## 2013-06-10 DIAGNOSIS — M792 Neuralgia and neuritis, unspecified: Secondary | ICD-10-CM

## 2013-06-10 DIAGNOSIS — R3915 Urgency of urination: Secondary | ICD-10-CM

## 2013-06-10 DIAGNOSIS — IMO0002 Reserved for concepts with insufficient information to code with codable children: Secondary | ICD-10-CM

## 2013-06-10 DIAGNOSIS — F410 Panic disorder [episodic paroxysmal anxiety] without agoraphobia: Secondary | ICD-10-CM

## 2013-06-10 LAB — POCT URINALYSIS DIPSTICK
Bilirubin, UA: NEGATIVE
Ketones, UA: NEGATIVE
Leukocytes, UA: NEGATIVE
Spec Grav, UA: >=1.03
pH, UA: 6

## 2013-06-10 MED ORDER — HYDROCODONE-ACETAMINOPHEN 10-325 MG PO TABS: 1.0000 | ORAL_TABLET | Freq: Three times a day (TID) | ORAL | Status: DC | PRN

## 2013-06-10 MED ORDER — GABAPENTIN 300 MG PO CAPS: 600.0000 mg | ORAL_CAPSULE | Freq: Three times a day (TID) | ORAL | Status: DC

## 2013-06-10 MED ORDER — ALPRAZOLAM 2 MG PO TABS: ORAL_TABLET | ORAL | Status: DC

## 2013-06-10 NOTE — Assessment & Plan Note (Addendum)
Strength is actually improved a little today. Will cancel MRI for now. UDS today. Continue Norco 10-325 #45; 2 months written for and discussed with patient. Follow up in 2 months or sooner if worsening.

## 2013-06-10 NOTE — Assessment & Plan Note (Signed)
Doing well. Provided 2 prescriptions for xanax 2mg  #30, discussed rules as in AVS. Follow up in 2 months.

## 2013-06-10 NOTE — Assessment & Plan Note (Addendum)
UA is negative. Stay hydrated. Scheduled pee breaks every 2-3 hours.

## 2013-06-10 NOTE — Patient Instructions (Addendum)
It was nice to see you!  I think your arm is a little stronger today. We will cancel the MRI for now.  I gave you 2 prescriptions of xanax and 2 prescriptions for Norco. You can fill one of each this month and the second one next month. Do not lose them as I am not able to give you replacement prescriptions.  Your urine looks normal, just a little concentrated. Make sure you are staying hydrated. Try scheduled pee breaks every 2-3 hours for the next 2 dayss  I will see you back before Christmas for refills.

## 2013-06-10 NOTE — Progress Notes (Signed)
  Subjective:    Patient ID: Sandra Briggs, female    DOB: 10-30-1975, 37 y.o.   MRN: [id number]  HPI Sandra Briggs is here for f/u neck, anxiety and urinary urgency.  Neck pain She reports that this is stable to slightly improved.  The left arm numbness and weakness is a little better on the higher dose of gabapentin.  She did have some right sided shoulder muscle spasms on Tuesday.  These resolved in a few hours.  She is working at [employer] currently which puts a little more strain on her than normal.  Last norco this am at [employer].  Anxiety Reports doing well the last week.  States she typically will go a week or so without needing any xanax, then have a rough few days where she takes more frequently.  Still has a few xanax left.  Urinary urgency Starting on Sunday, she noticed urinary urgency, but with very small output.  Also having some frequency.  No dysuria or incontinence.  No fevers or chills.  No flank pain.  I have reviewed and updated the following as appropriate: allergies and current medications SHx: former smoker  Review of Systems See HPI    Objective:   Physical Exam BP 115/79  Pulse 76  Temp(Src) 98.1 F (36.7 C) (Oral)  Ht 5\' 4"  (1.626 m)  Wt 184 lb (83.462 kg)  BMI 31.57 kg/m2 Gen: alert, cooperative, NAD HEENT: AT/, sclera white, MMM Neck: supple CV: RRR, no murmurs Pulm: CTAB, no wheezes or rales Neuro: upper extremity strength is equal except for very mild decrease grip on left (improved from last month).      Assessment & Plan:

## 2013-06-11 LAB — DRUG SCREEN, URINE
Amphetamine Screen, Ur: NEGATIVE
Barbiturate Quant, Ur: NEGATIVE
Cocaine Metabolites: NEGATIVE
Marijuana Metabolite: NEGATIVE
Opiates: POSITIVE — AB

## 2013-06-16 ENCOUNTER — Emergency Department (HOSPITAL_COMMUNITY)
Admission: EM | Admit: 2013-06-16 | Discharge: 2013-06-17 | Disposition: A | Discharge status: Home / Self Care | Payer: Self-pay | Attending: Emergency Medicine | Admitting: Emergency Medicine

## 2013-06-16 ENCOUNTER — Encounter (HOSPITAL_COMMUNITY): Payer: Self-pay | Admitting: Emergency Medicine

## 2013-06-16 DIAGNOSIS — J039 Acute tonsillitis, unspecified: Secondary | ICD-10-CM | POA: Insufficient documentation

## 2013-06-16 DIAGNOSIS — R55 Syncope and collapse: Secondary | ICD-10-CM | POA: Insufficient documentation

## 2013-06-16 DIAGNOSIS — R42 Dizziness and giddiness: Secondary | ICD-10-CM | POA: Insufficient documentation

## 2013-06-16 DIAGNOSIS — Z8659 Personal history of other mental and behavioral disorders: Secondary | ICD-10-CM | POA: Insufficient documentation

## 2013-06-16 DIAGNOSIS — Z87891 Personal history of nicotine dependence: Secondary | ICD-10-CM | POA: Insufficient documentation

## 2013-06-16 DIAGNOSIS — Z3202 Encounter for pregnancy test, result negative: Secondary | ICD-10-CM | POA: Insufficient documentation

## 2013-06-16 DIAGNOSIS — J45990 Exercise induced bronchospasm: Secondary | ICD-10-CM | POA: Insufficient documentation

## 2013-06-16 DIAGNOSIS — Z87828 Personal history of other (healed) physical injury and trauma: Secondary | ICD-10-CM | POA: Insufficient documentation

## 2013-06-16 DIAGNOSIS — G8929 Other chronic pain: Secondary | ICD-10-CM | POA: Insufficient documentation

## 2013-06-16 DIAGNOSIS — R5381 Other malaise: Secondary | ICD-10-CM | POA: Insufficient documentation

## 2013-06-16 DIAGNOSIS — Z79899 Other long term (current) drug therapy: Secondary | ICD-10-CM | POA: Insufficient documentation

## 2013-06-16 LAB — PREGNANCY, URINE: Preg Test, Ur: NEGATIVE

## 2013-06-16 NOTE — ED Notes (Signed)
Pt has strept throat, working at [employer], Pt had syncope episode that lasted 30 seconds at work

## 2013-06-17 ENCOUNTER — Encounter (HOSPITAL_COMMUNITY): Payer: Self-pay | Admitting: Radiology

## 2013-06-17 ENCOUNTER — Emergency Department (HOSPITAL_COMMUNITY): Payer: Self-pay

## 2013-06-17 LAB — CBC WITH DIFFERENTIAL/PLATELET
Basophils Absolute: 0.1 10*3/uL (ref 0.0–0.1)
Basophils Relative: 1 % (ref 0–1)
Eosinophils Absolute: 0.1 10*3/uL (ref 0.0–0.7)
Eosinophils Relative: 1 % (ref 0–5)
HCT: 38.8 % (ref 36.0–46.0)
Hemoglobin: 13.5 g/dL (ref 12.0–15.0)
Lymphocytes Relative: 11 % — ABNORMAL LOW (ref 12–46)
Lymphs Abs: 2.1 10*3/uL (ref 0.7–4.0)
MCH: 32.1 pg (ref 26.0–34.0)
MCHC: 34.8 g/dL (ref 30.0–36.0)
Monocytes Absolute: 1.1 10*3/uL — ABNORMAL HIGH (ref 0.1–1.0)
Monocytes Relative: 6 % (ref 3–12)
Neutro Abs: 15 10*3/uL — ABNORMAL HIGH (ref 1.7–7.7)
WBC: 18.3 10*3/uL — ABNORMAL HIGH (ref 4.0–10.5)

## 2013-06-17 LAB — URINALYSIS, ROUTINE W REFLEX MICROSCOPIC
Glucose, UA: NEGATIVE mg/dL
Hgb urine dipstick: NEGATIVE
Ketones, ur: NEGATIVE mg/dL
Protein, ur: NEGATIVE mg/dL
Specific Gravity, Urine: 1.007 (ref 1.005–1.030)

## 2013-06-17 LAB — COMPREHENSIVE METABOLIC PANEL
Albumin: 4 g/dL (ref 3.5–5.2)
BUN: 9 mg/dL (ref 6–23)
CO2: 25 mEq/L (ref 19–32)
Calcium: 9 mg/dL (ref 8.4–10.5)
Chloride: 103 mEq/L (ref 96–112)
Creatinine, Ser: 0.7 mg/dL (ref 0.50–1.10)
GFR calc Af Amer: >90 mL/min (ref >90)
GFR calc non Af Amer: >90 mL/min (ref >90)
Glucose, Bld: 100 mg/dL — ABNORMAL HIGH (ref 70–99)
Total Bilirubin: 0.4 mg/dL (ref 0.3–1.2)

## 2013-06-17 LAB — RAPID STREP SCREEN (MED CTR MEBANE ONLY): Streptococcus, Group A Screen (Direct): NEGATIVE

## 2013-06-17 LAB — MONONUCLEOSIS SCREEN: Mono Screen: NEGATIVE

## 2013-06-17 MED ORDER — SODIUM CHLORIDE 0.9 % IV SOLN
1000.0000 mL | Freq: Once | INTRAVENOUS | Status: AC
  Administered 2013-06-17: 1000 mL via INTRAVENOUS

## 2013-06-17 MED ORDER — ACETAMINOPHEN 325 MG PO TABS
650.0000 mg | ORAL_TABLET | Freq: Once | ORAL | Status: AC
  Administered 2013-06-17: 650 mg via ORAL
  Filled 2013-06-17: qty 2

## 2013-06-17 MED ORDER — SODIUM CHLORIDE 0.9 % IV SOLN
1000.0000 mL | INTRAVENOUS | Status: DC
  Administered 2013-06-17: 1000 mL via INTRAVENOUS

## 2013-06-17 MED ORDER — DOXYCYCLINE HYCLATE 100 MG PO CAPS: 100.0000 mg | ORAL_CAPSULE | Freq: Two times a day (BID) | ORAL | Status: DC

## 2013-06-17 MED ORDER — MORPHINE SULFATE 4 MG/ML IJ SOLN
4.0000 mg | Freq: Once | INTRAMUSCULAR | Status: AC
  Administered 2013-06-17: 4 mg via INTRAVENOUS
  Filled 2013-06-17: qty 1

## 2013-06-17 MED ORDER — DEXAMETHASONE SODIUM PHOSPHATE 10 MG/ML IJ SOLN
10.0000 mg | Freq: Once | INTRAMUSCULAR | Status: AC
  Administered 2013-06-17: 10 mg via INTRAVENOUS
  Filled 2013-06-17: qty 1

## 2013-06-17 MED ORDER — OXYCODONE-ACETAMINOPHEN 5-325 MG PO TABS: 1.0000 | ORAL_TABLET | ORAL | Status: DC | PRN

## 2013-06-17 MED ORDER — IOHEXOL 300 MG/ML  SOLN
75.0000 mL | Freq: Once | INTRAMUSCULAR | Status: AC | PRN
  Administered 2013-06-17: 75 mL via INTRAVENOUS

## 2013-06-17 MED ORDER — SODIUM CHLORIDE 0.9 % IV BOLUS (SEPSIS)
1000.0000 mL | Freq: Once | INTRAVENOUS | Status: AC
  Administered 2013-06-17: 1000 mL via INTRAVENOUS

## 2013-06-17 MED ORDER — DEXTROSE 5 % IV SOLN
1.0000 g | Freq: Once | INTRAVENOUS | Status: AC
  Administered 2013-06-17: 1 g via INTRAVENOUS
  Filled 2013-06-17: qty 10

## 2013-06-17 NOTE — ED Provider Notes (Signed)
CSN: [id number]     Arrival date & time 06/16/13  2316 History   First MD Initiated Contact with Patient 06/17/13 0002     Chief Complaint  Patient presents with  . Loss of Consciousness  . Sore Throat   (Consider location/radiation/quality/duration/timing/severity/associated sxs/prior Treatment) Patient is a 37 y.o. female presenting with syncope and pharyngitis. The history is provided by the patient.  Loss of Consciousness Sore Throat  She says that she was treated for strep throat about 2 weeks ago with an injection and 10 days of antibiotics. She was doing reasonably well until about 3 days ago when she started having recurrence of sore throat and fatigue. Throat pain is moderate and she rates at 6/10. It is worse when she tries to swallow and but she states she has been able to drink a small amount of fluids today. She developed fever today was 101.4. She's also had arthralgias and myalgias in her lower stomach is. There is associated headache and nonproductive cough. She went to work at [employer] and states that the she was walking when she got dizzy and lightheaded and then passed out with loss of consciousness estimated at no more than 30 seconds. She denies chest pain, heaviness, tightness, pressure. There was no incontinence.   Past Medical History  Diagnosis Date  . Exercise-induced asthma   . Panic disorder   . Dysuria-frequency syndrome   . Chronic pain     flank  . Sprain of MCL (medial collateral ligament) of knee    Past Surgical History  Procedure Laterality Date  . Tubal ligation  06/28/2005  . Endometrial ablation  2009  . Cesarean section    . Appendectomy     Family History  Problem Relation Age of Onset  . Lung cancer Mother   . Fibroids Sister   . Hyperlipidemia Maternal Grandmother    History  Substance Use Topics  . Smoking status: Former Smoker    Types: Cigarettes    Quit date: 02/27/2011  . Smokeless tobacco: Former Neurosurgeon    Quit date:  10/22/2010  . Alcohol Use: 0.0 oz/week     Comment: rare   OB History   Grav Para Term Preterm Abortions TAB SAB Ect Mult Living                 Review of Systems  Cardiovascular: Positive for syncope.  All other systems reviewed and are negative.    Allergies  Sulfonamide derivatives; Bee venom; and Tramadol hcl  Home Medications   Current Outpatient Rx  Name  Route  Sig  Dispense  Refill  . gabapentin (NEURONTIN) 300 MG capsule   Oral   Take 2 capsules (600 mg total) by mouth 3 (three) times daily.   180 capsule   3   . HYDROcodone-acetaminophen (NORCO) 10-325 MG per tablet   Oral   Take 1 tablet by mouth every 8 (eight) hours as needed for pain.   45 tablet   0     Fill 30 days from last refill.   Marland Kitchen ibuprofen (ADVIL,MOTRIN) 800 MG tablet   Oral   Take 800 mg by mouth every 8 (eight) hours as needed for pain.         Marland Kitchen lamoTRIgine (LAMICTAL) 200 MG tablet   Oral   Take 1 tablet (200 mg total) by mouth daily. Taking for bi-polar disorder   60 tablet   6   . acetaminophen (TYLENOL) 500 MG tablet   Oral  Take 1,000 mg by mouth every 6 (six) hours as needed. Patient used this medication for neck pain.         . cyclobenzaprine (FLEXERIL) 5 MG tablet   Oral   Take 1 tablet (5 mg total) by mouth 3 (three) times daily as needed for muscle spasms.   30 tablet   2    BP 113/66  Pulse 93  Temp(Src) 99.4 F (37.4 C) (Oral)  Resp 24  SpO2 98% Physical Exam  Nursing note and vitals reviewed.  37 year old female, resting comfortably and in no acute distress. Vital signs are significant for tachypnea with respiratory rate of 24. Oxygen saturation is 98%, which is normal. Head is normocephalic and atraumatic. PERRLA, EOMI. Oropharynx shows moderate erythema of the tonsils and oropharynx. No exudate is present. There is mild edema of the uvula and mild tonsillar hypertrophy bilaterally. Neck is nontender and supple without adenopathy or JVD. Back is  nontender and there is no CVA tenderness. Lungs are clear without rales, wheezes, or rhonchi. Chest is nontender. Heart has regular rate and rhythm without murmur. Abdomen is soft, flat, nontender without masses or hepatosplenomegaly and peristalsis is normoactive. Extremities have no cyanosis or edema, full range of motion is present. Skin is warm and dry without rash. Neurologic: Mental status is normal, cranial nerves are intact, there are no motor or sensory deficits.  ED Course  Procedures (including critical care time) Labs Review Results for orders placed during the hospital encounter of 06/16/13  RAPID STREP SCREEN      Result Value Range   Streptococcus, Group A Screen (Direct) NEGATIVE  NEGATIVE  URINALYSIS, ROUTINE W REFLEX MICROSCOPIC      Result Value Range   Color, Urine YELLOW  YELLOW   APPearance CLEAR  CLEAR   Specific Gravity, Urine 1.007  1.005 - 1.030   pH 7.0  5.0 - 8.0   Glucose, UA NEGATIVE  NEGATIVE mg/dL   Hgb urine dipstick NEGATIVE  NEGATIVE   Bilirubin Urine NEGATIVE  NEGATIVE   Ketones, ur NEGATIVE  NEGATIVE mg/dL   Protein, ur NEGATIVE  NEGATIVE mg/dL   Urobilinogen, UA 0.2  0.0 - 1.0 mg/dL   Nitrite NEGATIVE  NEGATIVE   Leukocytes, UA NEGATIVE  NEGATIVE  PREGNANCY, URINE      Result Value Range   Preg Test, Ur NEGATIVE  NEGATIVE  CBC WITH DIFFERENTIAL      Result Value Range   WBC 18.3 (*) 4.0 - 10.5 K/uL   RBC 4.20  3.87 - 5.11 MIL/uL   Hemoglobin 13.5  12.0 - 15.0 g/dL   HCT 40.9  81.1 - 91.4 %   MCV 92.4  78.0 - 100.0 fL   MCH 32.1  26.0 - 34.0 pg   MCHC 34.8  30.0 - 36.0 g/dL   RDW 78.2  95.6 - 21.3 %   Platelets 199  150 - 400 K/uL   Neutrophils Relative % 82 (*) 43 - 77 %   Neutro Abs 15.0 (*) 1.7 - 7.7 K/uL   Lymphocytes Relative 11 (*) 12 - 46 %   Lymphs Abs 2.1  0.7 - 4.0 K/uL   Monocytes Relative 6  3 - 12 %   Monocytes Absolute 1.1 (*) 0.1 - 1.0 K/uL   Eosinophils Relative 1  0 - 5 %   Eosinophils Absolute 0.1  0.0 - 0.7  K/uL   Basophils Relative 1  0 - 1 %   Basophils Absolute 0.1  0.0 -  0.1 K/uL  COMPREHENSIVE METABOLIC PANEL      Result Value Range   Sodium 139  135 - 145 mEq/L   Potassium 4.0  3.5 - 5.1 mEq/L   Chloride 103  96 - 112 mEq/L   CO2 25  19 - 32 mEq/L   Glucose, Bld 100 (*) 70 - 99 mg/dL   BUN 9  6 - 23 mg/dL   Creatinine, Ser 1.61  0.50 - 1.10 mg/dL   Calcium 9.0  8.4 - 09.6 mg/dL   Total Protein 7.1  6.0 - 8.3 g/dL   Albumin 4.0  3.5 - 5.2 g/dL   AST 18  0 - 37 U/L   ALT 15  0 - 35 U/L   Alkaline Phosphatase 68  39 - 117 U/L   Total Bilirubin 0.4  0.3 - 1.2 mg/dL   GFR calc non Af Amer >90  >90 mL/min   GFR calc Af Amer >90  >90 mL/min  MONONUCLEOSIS SCREEN      Result Value Range   Mono Screen NEGATIVE  NEGATIVE   Imaging Review Ct Head Wo Contrast  06/17/2013   CLINICAL DATA:  Loss of consciousness post fall.  EXAM: CT HEAD WITHOUT CONTRAST  TECHNIQUE: Contiguous axial images were obtained from the base of the skull through the vertex without intravenous contrast.  COMPARISON:  02/19/2012  FINDINGS: There is no evidence of acute intracranial hemorrhage, brain edema, mass lesion, acute infarction, mass effect, or midline shift. Acute infarct may be inapparent on noncontrast CT. No other intra-axial abnormalities are seen, and the ventricles and sulci are within normal limits in size and symmetry. No abnormal extra-axial fluid collections or masses are identified. No significant calvarial abnormality.  IMPRESSION: Negative for bleed or other acute intracranial process.   Electronically Signed   By: Oley Balm M.D.   On: 06/17/2013 00:32   Ct Soft Tissue Neck W Contrast  06/17/2013   CLINICAL DATA:  Throat pain and difficulty swallowing.  EXAM: CT NECK WITH CONTRAST  TECHNIQUE: Multidetector CT imaging of the neck was performed using the standard protocol following the bolus administration of intravenous contrast.  CONTRAST:  75mL OMNIPAQUE IOHEXOL 300 MG/ML  SOLN  COMPARISON:   None.  FINDINGS: The tonsils are enlarged and hyperenhancing, especially the adenoid. No evidence of abscess or parapharyngeal edema. No retropharyngeal edema. There is mild reactive enlargement of cervical lymph nodes, including the lateral right retropharyngeal station, without cavitation. No thickening of the epiglottis.  Small tracheal diverticulum to the right at the thoracic inlet, incidental. Clear apical lungs. Unremarkable salivary and thyroid glands. Major vessels of the neck are patent.  IMPRESSION: Tonsillitis without abscess.   Electronically Signed   By: Tiburcio Pea M.D.   On: 06/17/2013 04:17   MDM  No diagnosis found. Pharyngitis which could be recurrence of her strep. Also consider possibility of mononucleosis or other viral pharyngitis. Syncope seems likely to be orthostatic and orthostatic vital signs will be checked. She'll be given IV hydration as well as steroids. I reviewed her office records and I see do not see any mention of strep screen or treatment for strep in the patient states [facility]  Strep screen and mono screen are negative. Patient says she's not feeling much better following IV hydration and IV dexamethasone. She was sent for a CT of her neck which confirms tonsillitis with no other acute pathology. With her elevated WBC, the selected to treat her for  possible non-strep bacterial tonsillitis and she is given initial dose of ceftriaxone and his discharge description for doxycycline. She is given a prescription for oxycodone acetaminophen and is referred back to her PCP for followup.  Dione Booze, MD 06/17/13 [phone]

## 2013-06-19 LAB — CULTURE, GROUP A STREP

## 2013-07-28 ENCOUNTER — Ambulatory Visit (INDEPENDENT_AMBULATORY_CARE_PROVIDER_SITE_OTHER): Payer: Self-pay | Admitting: Emergency Medicine

## 2013-07-28 ENCOUNTER — Encounter: Payer: Self-pay | Admitting: Emergency Medicine

## 2013-07-28 VITALS — BP 123/87 | HR 92 | Ht 64.0 in | Wt 182.0 lb

## 2013-07-28 DIAGNOSIS — F410 Panic disorder [episodic paroxysmal anxiety] without agoraphobia: Secondary | ICD-10-CM

## 2013-07-28 DIAGNOSIS — M542 Cervicalgia: Secondary | ICD-10-CM

## 2013-07-28 MED ORDER — ALPRAZOLAM 2 MG PO TABS: ORAL_TABLET | ORAL | Status: DC

## 2013-07-28 MED ORDER — HYDROCODONE-ACETAMINOPHEN 10-325 MG PO TABS: 1.0000 | ORAL_TABLET | Freq: Three times a day (TID) | ORAL | Status: DC | PRN

## 2013-07-28 NOTE — Assessment & Plan Note (Signed)
Stable. Refilled xanax #30; 2 prescriptions given. F/u in 2 months.

## 2013-07-28 NOTE — Assessment & Plan Note (Signed)
Worse over the last few days following a [place] cruise. No red flags.   Grip strength is more equal today. Refilled Norco 10-325 #45; 2 prescriptions given. Follow up in 2 months.

## 2013-07-28 NOTE — Patient Instructions (Signed)
It was good to see you!  I'm glad you are feeling better.  I'll see you back in 2 months.

## 2013-07-28 NOTE — Progress Notes (Signed)
   Subjective:    Patient ID: Sandra Briggs, female    DOB: 09/22/1975, 37 y.o.   MRN: [id number]  HPI Sandra Briggs is here for med refill.  She takes norco daily as needed for neck pain with radiculopathy.  She states that this has been great up until the last few days.  She recently went on a cruise to [place] and had a wonderful time.  Since returning, her neck and shoulders have been very sore and stiff.  No worsening numbness or weakness.  She also takes xanax as needed for anxiety.  This has been stable.  I have reviewed and updated the following as appropriate: allergies and current medications SHx: former smoker  Health Maintenance: will need pap at follow up   Review of Systems See HPI    Objective:   Physical Exam BP 123/87  Pulse 92  Ht 5\' 4"  (1.626 m)  Wt 182 lb (82.555 kg)  BMI 31.22 kg/m2 Gen: alert, cooperative, NAD Neck: supple; pain with flexion and rotation to the right; diffuse tenderness and spasm in bilaterally trapezii.  Neuro: grip on right slightly decreased compared to left, but improved from previous     Assessment & Plan:

## 2013-09-27 ENCOUNTER — Encounter: Payer: Self-pay | Admitting: Emergency Medicine

## 2013-09-27 ENCOUNTER — Ambulatory Visit (INDEPENDENT_AMBULATORY_CARE_PROVIDER_SITE_OTHER): Payer: Self-pay | Admitting: Emergency Medicine

## 2013-09-27 VITALS — BP 125/85 | HR 87 | Temp 98.4°F | Ht 64.0 in | Wt 182.8 lb

## 2013-09-27 DIAGNOSIS — R69 Illness, unspecified: Principal | ICD-10-CM

## 2013-09-27 DIAGNOSIS — J111 Influenza due to unidentified influenza virus with other respiratory manifestations: Secondary | ICD-10-CM

## 2013-09-27 DIAGNOSIS — M542 Cervicalgia: Secondary | ICD-10-CM

## 2013-09-27 DIAGNOSIS — F410 Panic disorder [episodic paroxysmal anxiety] without agoraphobia: Secondary | ICD-10-CM

## 2013-09-27 MED ORDER — BENZONATATE 200 MG PO CAPS: 200.0000 mg | ORAL_CAPSULE | Freq: Two times a day (BID) | ORAL | Status: DC | PRN

## 2013-09-27 MED ORDER — ALPRAZOLAM 2 MG PO TABS: ORAL_TABLET | ORAL | Status: DC

## 2013-09-27 MED ORDER — HYDROCODONE-ACETAMINOPHEN 10-325 MG PO TABS: 1.0000 | ORAL_TABLET | Freq: Three times a day (TID) | ORAL | Status: DC | PRN

## 2013-09-27 MED ORDER — OSELTAMIVIR PHOSPHATE 75 MG PO CAPS: 75.0000 mg | ORAL_CAPSULE | Freq: Two times a day (BID) | ORAL | Status: DC

## 2013-09-27 NOTE — Assessment & Plan Note (Signed)
History consistent with flu-like illness. Will treat with Tamiflu 75mg  BID x5 days. Tessalon for cough prn. Discussed rest and fluids. F/u if not improving next week.

## 2013-09-27 NOTE — Patient Instructions (Signed)
It was nice to see you! You have the flu or something very similar.  I sent in Tamiflu.  Take 1 pill twice a day for 5 days. I also sent in tessalon pearls for the cough.  You are going to feel crappy for probably another week.  I will see you back in 1-2 weeks if you are not starting to feel better.  Otherwise, I will see you in 2 months.

## 2013-09-27 NOTE — Assessment & Plan Note (Signed)
Stable. Refilled Norco x2 months.

## 2013-09-27 NOTE — Progress Notes (Signed)
   Subjective:    Patient ID: Sandra Briggs, female    DOB: 04-Feb-1976, 38 y.o.   MRN: [id number]  HPI Sandra Briggs is here for med refill and illness.  1. Med refill: She is on norco for neck pain and xanax for panic disorder.  She reports both are stable at this time.  The neck will flare up with coughing.    2. Illness: 3 days ago she started with some fatigue.  2 days ago developed headache, cough, body aches, subjective fever and rigors.  Reports nasal congestion and some right ear pain, but no rhinorrhea.  Appetite is decreased, but drinking fluids.  Denies sick contacts.  She did receive her flu shot this year.  She has been taking tylenol several times a day for the last 2 days.  At her request, I put her diagnosis in her work note.  Current Outpatient Prescriptions on File Prior to Visit  Medication Sig Dispense Refill  . acetaminophen (TYLENOL) 500 MG tablet Take 1,000 mg by mouth every 6 (six) hours as needed. Patient used this medication for neck pain.      . cyclobenzaprine (FLEXERIL) 5 MG tablet Take 1 tablet (5 mg total) by mouth 3 (three) times daily as needed for muscle spasms.  30 tablet  2  . gabapentin (NEURONTIN) 300 MG capsule Take 2 capsules (600 mg total) by mouth 3 (three) times daily.  180 capsule  3  . ibuprofen (ADVIL,MOTRIN) 800 MG tablet Take 800 mg by mouth every 8 (eight) hours as needed for pain.      Marland Kitchen lamoTRIgine (LAMICTAL) 200 MG tablet Take 1 tablet (200 mg total) by mouth daily. Taking for bi-polar disorder  60 tablet  6   No current facility-administered medications on file prior to visit.    I have reviewed and updated the following as appropriate: allergies and current medications SHx: former smoker  Health Maintenance: did get flu shot this year   Review of Systems See HPI    Objective:   Physical Exam BP 125/85  Pulse 87  Temp(Src) 98.4 F (36.9 C) (Oral)  Ht 5\' 4"  (1.626 m)  Wt 182 lb 12.8 oz (82.918 kg)  BMI 31.36 kg/m2 Gen: alert,  cooperative, NAD, appears tired HEENT: AT/, sclera white, MMM, no pharyngeal erythema or exudate, nasal mucosa normal; R TM retracted, L TM normal Neck: supple, no LAD CV: RRR, no murmurs Pulm: CTAB, no wheezes or rales      Assessment & Plan:

## 2013-09-27 NOTE — Assessment & Plan Note (Signed)
Stable. Refilled xanax x2 months.

## 2013-11-17 ENCOUNTER — Ambulatory Visit (INDEPENDENT_AMBULATORY_CARE_PROVIDER_SITE_OTHER): Payer: Self-pay | Admitting: Emergency Medicine

## 2013-11-17 ENCOUNTER — Encounter: Payer: Self-pay | Admitting: Emergency Medicine

## 2013-11-17 VITALS — BP 142/84 | HR 109 | Temp 97.9°F | Ht 64.0 in | Wt 183.0 lb

## 2013-11-17 DIAGNOSIS — M792 Neuralgia and neuritis, unspecified: Secondary | ICD-10-CM

## 2013-11-17 DIAGNOSIS — R3915 Urgency of urination: Secondary | ICD-10-CM

## 2013-11-17 DIAGNOSIS — F410 Panic disorder [episodic paroxysmal anxiety] without agoraphobia: Secondary | ICD-10-CM

## 2013-11-17 DIAGNOSIS — IMO0002 Reserved for concepts with insufficient information to code with codable children: Secondary | ICD-10-CM

## 2013-11-17 DIAGNOSIS — M542 Cervicalgia: Secondary | ICD-10-CM

## 2013-11-17 DIAGNOSIS — R300 Dysuria: Secondary | ICD-10-CM

## 2013-11-17 LAB — POCT URINALYSIS DIPSTICK
Bilirubin, UA: NEGATIVE
Blood, UA: NEGATIVE
Glucose, UA: NEGATIVE
KETONES UA: NEGATIVE
LEUKOCYTES UA: NEGATIVE
NITRITE UA: NEGATIVE
PH UA: 6
Spec Grav, UA: >=1.03
UROBILINOGEN UA: 0.2

## 2013-11-17 MED ORDER — HYDROCODONE-ACETAMINOPHEN 10-325 MG PO TABS: 1.0000 | ORAL_TABLET | Freq: Three times a day (TID) | ORAL | Status: DC | PRN

## 2013-11-17 MED ORDER — GABAPENTIN 300 MG PO CAPS: 600.0000 mg | ORAL_CAPSULE | Freq: Three times a day (TID) | ORAL | Status: DC

## 2013-11-17 MED ORDER — ALPRAZOLAM 2 MG PO TABS: ORAL_TABLET | ORAL | Status: DC

## 2013-11-17 MED ORDER — LAMOTRIGINE 200 MG PO TABS
200.0000 mg | ORAL_TABLET | Freq: Every day | ORAL | Status: DC
Start: 2013-11-17 — End: 2014-05-13

## 2013-11-17 MED ORDER — CEPHALEXIN 500 MG PO CAPS: 500.0000 mg | ORAL_CAPSULE | Freq: Three times a day (TID) | ORAL | Status: DC

## 2013-11-17 NOTE — Progress Notes (Signed)
   Subjective:    Patient ID: Sandra Briggs, female    DOB: 04-27-1976, 38 y.o.   MRN: [id number]  HPI Sandra Briggs is here for med refill and urine odor.  Med refill She is on Norco from cervicalgia and xanax for panic disorder.  She states both are stable.  Her neck has flared a little since taking her kids to the park a few weeks ago, but it is getting back to normal.  Urine odor Reports intermittent strong urine odor for the last 2 weeks.  It is associated with some "pinching" low back pain that goes up her right side.  + urinary frequency and urgency.  + suprapubic pain.  No dysuria.  No hematuria.  No fevers.  Current Outpatient Prescriptions on File Prior to Visit  Medication Sig Dispense Refill  . acetaminophen (TYLENOL) 500 MG tablet Take 1,000 mg by mouth every 6 (six) hours as needed. Patient used this medication for neck pain.      . cyclobenzaprine (FLEXERIL) 5 MG tablet Take 1 tablet (5 mg total) by mouth 3 (three) times daily as needed for muscle spasms.  30 tablet  2  . ibuprofen (ADVIL,MOTRIN) 800 MG tablet Take 800 mg by mouth every 8 (eight) hours as needed for pain.       No current facility-administered medications on file prior to visit.    I have reviewed and updated the following as appropriate: allergies and current medications SHx: former smoker   Review of Systems See HPI    Objective:   Physical Exam BP 142/84  Pulse 109  Temp(Src) 97.9 F (36.6 C) (Oral)  Ht 5\' 4"  (1.626 m)  Wt 183 lb (83.008 kg)  BMI 31.40 kg/m2 Gen: alert, cooperative, NAD CV: RRR, no murmurs Pulm: CTAB Abd: soft, ND, mild suprapubic tenderness Back: +R CVA tenderness     Assessment & Plan:

## 2013-11-17 NOTE — Patient Instructions (Signed)
It was nice to see you!  I gave you 2 months worth of your xanax and hydrocodone.  I sent in antibiotics for your urine. I also sent the urine for culture - I will call if we need to change the antibiotic. Keep drinking plenty of water and cranberry juice.  Follow up in 2 months or sooner as needed.

## 2013-11-17 NOTE — Assessment & Plan Note (Signed)
History and exam concerning for UTI vs early pyelonephritis. UA clear except for some protein. She is mildly tachycardiac, no fevers. Will go ahead and start keflex 500mg  TID x7 days and send urine for culture. F/u prn.

## 2013-11-17 NOTE — Assessment & Plan Note (Signed)
Stable. Refilled Norco #45 x2.

## 2013-11-17 NOTE — Assessment & Plan Note (Signed)
Stable. Refilled xanax #30 x2.

## 2013-11-19 LAB — URINE CULTURE: Colony Count: 45000

## 2013-12-22 ENCOUNTER — Encounter: Payer: Self-pay | Admitting: Emergency Medicine

## 2013-12-22 ENCOUNTER — Ambulatory Visit (INDEPENDENT_AMBULATORY_CARE_PROVIDER_SITE_OTHER): Payer: Self-pay | Admitting: Emergency Medicine

## 2013-12-22 VITALS — BP 124/80 | HR 88 | Ht 64.0 in | Wt 184.0 lb

## 2013-12-22 DIAGNOSIS — M542 Cervicalgia: Secondary | ICD-10-CM

## 2013-12-22 MED ORDER — PREDNISONE 10 MG PO TABS: ORAL_TABLET | ORAL | Status: DC

## 2013-12-22 NOTE — Patient Instructions (Signed)
It was nice to see you!  I'm a little worried that something changed in your neck. We are going to get the MRI.  Take the Prednisone 5 tablets for 5 days, then 4 tablets for 1 day, then 3 tablets for 1 day, then 2 tablets for 1 day, then 1 tablet for 1 day. Increase your gabapentin to 1 pill in the morning and at lunch and 2 pills at bedtime. Keep taking the flexeril, ibuprofen and vicodin as needed.  Follow up with me in 1 week or a day or two after the MRI.

## 2013-12-22 NOTE — Progress Notes (Signed)
   Subjective:    Patient ID: Sandra Briggs, female    DOB: [birth year], 38 y.o.   MRN: [id number]  HPI Sandra Briggs is here for a same-day appointment for neck pain.  She has chronic cervical pain with radiculopathy. This is typically well-controlled on her gabapentin and when necessary Vicodin. Yesterday she woke up with acutely worse pain. The pain is at the base of her neck and radiates down her spine along the right side as well as down the left arm. It is worse if she raises her arms above shoulder level. She also reports her fingers are much more numb than is normal for her. Also reports worst spasms in her left arm. She denies any trauma or triggering events. No new heavy lifting or activity in the last week.  Current Outpatient Prescriptions on File Prior to Visit  Medication Sig Dispense Refill  . acetaminophen (TYLENOL) 500 MG tablet Take 1,000 mg by mouth every 6 (six) hours as needed. Patient used this medication for neck pain.      Marland Kitchen alprazolam (XANAX) 2 MG tablet Take 1/4 tablet every 6 hours as needed for anxiety.  30 tablet  0  . cephALEXin (KEFLEX) 500 MG capsule Take 1 capsule (500 mg total) by mouth 3 (three) times daily.  21 capsule  0  . cyclobenzaprine (FLEXERIL) 5 MG tablet Take 1 tablet (5 mg total) by mouth 3 (three) times daily as needed for muscle spasms.  30 tablet  2  . gabapentin (NEURONTIN) 300 MG capsule Take 2 capsules (600 mg total) by mouth 3 (three) times daily.  180 capsule  3  . HYDROcodone-acetaminophen (NORCO) 10-325 MG per tablet Take 1 tablet by mouth every 8 (eight) hours as needed.  45 tablet  0  . ibuprofen (ADVIL,MOTRIN) 800 MG tablet Take 800 mg by mouth every 8 (eight) hours as needed for pain.      Marland Kitchen lamoTRIgine (LAMICTAL) 200 MG tablet Take 1 tablet (200 mg total) by mouth daily. Taking for bi-polar disorder  60 tablet  6   No current facility-administered medications on file prior to visit.    I have reviewed and updated the following as  appropriate: allergies and current medications SHx: former smoker   Review of Systems See HPI    Objective:   Physical Exam BP 124/80  Pulse 88  Ht 5\' 4"  (1.626 m)  Wt 184 lb (83.462 kg)  BMI 31.57 kg/m2 Gen: alert, cooperative, mild distress Neck: limited ROM particularly with flexion/extension and looking to the left; tender at base of neck and down right side of spin Left arm: 2+ radial pulse; hand is mildly swollen; numbness along C6 distribution; grip noticeably weaker than right hand     Assessment & Plan:

## 2013-12-22 NOTE — Assessment & Plan Note (Signed)
Acutely worse in last 2 days. With increased radicular symptoms and swelling/weakness in left hand, will get MRI c-spine. Prednisone 50mg  x5 days with taper. Increase gabapentin to 300 qAM and lunch, 600 qHS. Continue to use flexeril, ibuprofen, and vicodin as needed. F/u in 1 week.

## 2013-12-27 ENCOUNTER — Ambulatory Visit (HOSPITAL_COMMUNITY)
Admission: RE | Admit: 2013-12-27 | Discharge: 2013-12-27 | Disposition: A | Discharge status: Home / Self Care | Payer: Self-pay | Source: Ambulatory Visit | Attending: Family Medicine | Admitting: Family Medicine

## 2013-12-27 DIAGNOSIS — M4040 Postural lordosis, site unspecified: Secondary | ICD-10-CM | POA: Insufficient documentation

## 2013-12-27 DIAGNOSIS — M5020 Other cervical disc displacement, unspecified cervical region: Secondary | ICD-10-CM | POA: Insufficient documentation

## 2013-12-27 DIAGNOSIS — M79609 Pain in unspecified limb: Secondary | ICD-10-CM | POA: Insufficient documentation

## 2013-12-27 DIAGNOSIS — M542 Cervicalgia: Secondary | ICD-10-CM

## 2013-12-27 DIAGNOSIS — M47812 Spondylosis without myelopathy or radiculopathy, cervical region: Secondary | ICD-10-CM | POA: Insufficient documentation

## 2013-12-30 ENCOUNTER — Ambulatory Visit (HOSPITAL_COMMUNITY)
Admission: RE | Admit: 2013-12-30 | Discharge: 2013-12-30 | Disposition: A | Discharge status: Home / Self Care | Payer: Self-pay | Source: Ambulatory Visit | Attending: Family Medicine | Admitting: Family Medicine

## 2013-12-30 ENCOUNTER — Encounter: Payer: Self-pay | Admitting: Emergency Medicine

## 2013-12-30 ENCOUNTER — Ambulatory Visit (INDEPENDENT_AMBULATORY_CARE_PROVIDER_SITE_OTHER): Payer: Self-pay | Admitting: Emergency Medicine

## 2013-12-30 VITALS — BP 126/75 | HR 90 | Temp 98.1°F | Resp 18 | Wt 179.0 lb

## 2013-12-30 DIAGNOSIS — M5030 Other cervical disc degeneration, unspecified cervical region: Secondary | ICD-10-CM | POA: Insufficient documentation

## 2013-12-30 DIAGNOSIS — M1120 Other chondrocalcinosis, unspecified site: Secondary | ICD-10-CM

## 2013-12-30 DIAGNOSIS — M792 Neuralgia and neuritis, unspecified: Secondary | ICD-10-CM

## 2013-12-30 DIAGNOSIS — IMO0002 Reserved for concepts with insufficient information to code with codable children: Secondary | ICD-10-CM

## 2013-12-30 DIAGNOSIS — M4040 Postural lordosis, site unspecified: Secondary | ICD-10-CM | POA: Insufficient documentation

## 2013-12-30 MED ORDER — COLCHICINE 0.6 MG PO TABS: 0.6000 mg | ORAL_TABLET | Freq: Two times a day (BID) | ORAL | Status: DC

## 2013-12-30 MED ORDER — GABAPENTIN 300 MG PO CAPS: 600.0000 mg | ORAL_CAPSULE | Freq: Three times a day (TID) | ORAL | Status: DC

## 2013-12-30 MED ORDER — HYDROCODONE-ACETAMINOPHEN 10-325 MG PO TABS: 1.0000 | ORAL_TABLET | Freq: Three times a day (TID) | ORAL | Status: DC | PRN

## 2013-12-30 NOTE — Patient Instructions (Signed)
It was nice to see you!  I'm glad you are a little better. The MRI is concerning for pseudogout in your neck.  We are checking some x-rays to make sure your neck is stable.  Take gabapentin 600mg  3 times a day. Take Colchicine 1 pill twice a day. I refilled your Norco today.  Follow up in 1-2 weeks.

## 2013-12-30 NOTE — Assessment & Plan Note (Addendum)
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 I discussed this with Dr. Ronnald Ramp, a neurosurgeon, over the phone.  He recommended cervical flexion and extension views to rule out any instability. Will get flexion-extension views of the cervical spine. As she did get some benefit from prednisone, we will start colchicine 0.6 mg twice a day. Gave prescription for Norco, increased # to 58. Increase gabapentin to 600 mg 3 times a day. Return precautions reviewed. Follow up in one to 2 weeks.

## 2013-12-30 NOTE — Progress Notes (Signed)
   Subjective:    Patient ID: Sandra Briggs, female    DOB: 08-04-1976, 38 y.o.   MRN: [id number]  HPI Sandra Briggs is here for followup neck pain and review MRI.  She states she is a little better since I saw her last week. She has a little bit improved range of motion in her neck. Continues to have pain that that is sharp and will shoot down the right side of her spine and down her left arm with certain movements. These movements include any neck motion, but particularly looking to the right and raising her left arm above shoulder level in abduction. No fevers. States the numbness in the left arm is stable.  We reviewed her MRI together.  Current Outpatient Prescriptions on File Prior to Visit  Medication Sig Dispense Refill  . acetaminophen (TYLENOL) 500 MG tablet Take 1,000 mg by mouth every 6 (six) hours as needed. Patient used this medication for neck pain.      Marland Kitchen alprazolam (XANAX) 2 MG tablet Take 1/4 tablet every 6 hours as needed for anxiety.  30 tablet  0  . cyclobenzaprine (FLEXERIL) 5 MG tablet Take 1 tablet (5 mg total) by mouth 3 (three) times daily as needed for muscle spasms.  30 tablet  2  . ibuprofen (ADVIL,MOTRIN) 800 MG tablet Take 800 mg by mouth every 8 (eight) hours as needed for pain.      Marland Kitchen lamoTRIgine (LAMICTAL) 200 MG tablet Take 1 tablet (200 mg total) by mouth daily. Taking for bi-polar disorder  60 tablet  6   No current facility-administered medications on file prior to visit.    I have reviewed and updated the following as appropriate: allergies and current medications SHx: former smoker   Review of Systems See HPI    Objective:   Physical Exam BP 126/75  Pulse 90  Temp(Src) 98.1 F (36.7 C) (Oral)  Resp 18  Wt 179 lb (81.194 kg)  SpO2 97% Gen: alert, cooperative, appears a little more comfortable today Neck: ROM limited in all directions secondary to pain; tender at base of cervical spine L shoulder: ROM limited by pain in both active and  passive abduction only Neuro: subjective numbness in left c6 distribution; grip strength weak but stable from last week      Assessment & Plan:

## 2013-12-31 ENCOUNTER — Telehealth: Payer: Self-pay | Admitting: Emergency Medicine

## 2013-12-31 NOTE — Telephone Encounter (Signed)
Called and spoke with patient. Let her know that the x-ray did not show any signs of instability.  She has an appt with me next Thursday to see how she is doing.

## 2014-01-06 ENCOUNTER — Ambulatory Visit (INDEPENDENT_AMBULATORY_CARE_PROVIDER_SITE_OTHER): Payer: Self-pay | Admitting: Emergency Medicine

## 2014-01-06 ENCOUNTER — Encounter: Payer: Self-pay | Admitting: Emergency Medicine

## 2014-01-06 VITALS — BP 126/82 | HR 87 | Ht 64.0 in | Wt 180.0 lb

## 2014-01-06 DIAGNOSIS — M25519 Pain in unspecified shoulder: Secondary | ICD-10-CM

## 2014-01-06 DIAGNOSIS — M1120 Other chondrocalcinosis, unspecified site: Secondary | ICD-10-CM

## 2014-01-06 MED ORDER — METHYLPREDNISOLONE ACETATE 40 MG/ML IJ SUSP
40.0000 mg | Freq: Once | INTRAMUSCULAR | Status: AC
  Administered 2014-01-06: 40 mg via INTRA_ARTICULAR

## 2014-01-06 NOTE — Progress Notes (Signed)
   Subjective:    Patient ID: Sandra Briggs, female    DOB: March 12, 1976, 38 y.o.   MRN: [id number]  HPI Sandra Briggs is here for followup neck and left arm pain.  She states her neck is better since starting the culture seen. She cannot look to the right without pain. Still unable to look to the left without discomfort. The numbness in her left arm is about 90% back to normal. The shooting pains are about 50% back to normal. She continues to have a heavy feeling in the left shoulder and arm. Pain with overhead movements of the left arm.  Current Outpatient Prescriptions on File Prior to Visit  Medication Sig Dispense Refill  . acetaminophen (TYLENOL) 500 MG tablet Take 1,000 mg by mouth every 6 (six) hours as needed. Patient used this medication for neck pain.      Marland Kitchen alprazolam (XANAX) 2 MG tablet Take 1/4 tablet every 6 hours as needed for anxiety.  30 tablet  0  . colchicine 0.6 MG tablet Take 1 tablet (0.6 mg total) by mouth 2 (two) times daily.  60 tablet  1  . cyclobenzaprine (FLEXERIL) 5 MG tablet Take 1 tablet (5 mg total) by mouth 3 (three) times daily as needed for muscle spasms.  30 tablet  2  . gabapentin (NEURONTIN) 300 MG capsule Take 2 capsules (600 mg total) by mouth 3 (three) times daily.  180 capsule  3  . HYDROcodone-acetaminophen (NORCO) 10-325 MG per tablet Take 1 tablet by mouth every 8 (eight) hours as needed.  60 tablet  0  . ibuprofen (ADVIL,MOTRIN) 800 MG tablet Take 800 mg by mouth every 8 (eight) hours as needed for pain.      Marland Kitchen lamoTRIgine (LAMICTAL) 200 MG tablet Take 1 tablet (200 mg total) by mouth daily. Taking for bi-polar disorder  60 tablet  6   No current facility-administered medications on file prior to visit.    I have reviewed and updated the following as appropriate: allergies and current medications SHx: former smoker   Review of Systems See HPI    Objective:   Physical Exam BP 126/82  Pulse 87  Ht 5\' 4"  (1.626 m)  Wt 180 lb (81.647 kg)   BMI 30.88 kg/m2 Gen: alert, cooperative, NAD Left shoulder: all movement is limited by pain, but worse in abduction Left hand: grip strength is stable      Assessment & Plan:  Left Shoulder Injection Informed consent was obtained.  Skin was prepped with alcohol.  Cold spray was used for anesthesia.  A steroid injection was performed at the left shoulder using 1% plain Lidocaine and 40 mg of depomedrol. This was well tolerated.

## 2014-01-06 NOTE — Assessment & Plan Note (Signed)
Slowly improving in the neck. Given prominent left shoulder pain, will do a steroid injection today. F/u in 2 weeks.

## 2014-01-06 NOTE — Patient Instructions (Signed)
Fu 2 weeks

## 2014-01-19 ENCOUNTER — Encounter: Payer: Self-pay | Admitting: Emergency Medicine

## 2014-01-19 ENCOUNTER — Other Ambulatory Visit (HOSPITAL_COMMUNITY)
Admission: RE | Admit: 2014-01-19 | Discharge: 2014-01-19 | Disposition: A | Discharge status: Home / Self Care | Payer: Self-pay | Source: Ambulatory Visit | Attending: Emergency Medicine | Admitting: Emergency Medicine

## 2014-01-19 ENCOUNTER — Ambulatory Visit (INDEPENDENT_AMBULATORY_CARE_PROVIDER_SITE_OTHER): Payer: Self-pay | Admitting: Emergency Medicine

## 2014-01-19 ENCOUNTER — Telehealth: Payer: Self-pay | Admitting: Emergency Medicine

## 2014-01-19 VITALS — BP 136/90 | HR 103 | Temp 98.6°F | Ht 64.0 in | Wt 179.5 lb

## 2014-01-19 DIAGNOSIS — M1120 Other chondrocalcinosis, unspecified site: Secondary | ICD-10-CM

## 2014-01-19 DIAGNOSIS — Z0000 Encounter for general adult medical examination without abnormal findings: Secondary | ICD-10-CM

## 2014-01-19 DIAGNOSIS — Z01419 Encounter for gynecological examination (general) (routine) without abnormal findings: Secondary | ICD-10-CM | POA: Insufficient documentation

## 2014-01-19 DIAGNOSIS — M542 Cervicalgia: Secondary | ICD-10-CM

## 2014-01-19 DIAGNOSIS — Z1151 Encounter for screening for human papillomavirus (HPV): Secondary | ICD-10-CM | POA: Insufficient documentation

## 2014-01-19 LAB — D-DIMER, QUANTITATIVE (NOT AT ARMC)

## 2014-01-19 MED ORDER — HYDROCODONE-ACETAMINOPHEN 10-325 MG PO TABS: 1.0000 | ORAL_TABLET | Freq: Three times a day (TID) | ORAL | Status: DC | PRN

## 2014-01-19 MED ORDER — DULOXETINE HCL 30 MG PO CPEP: 30.0000 mg | ORAL_CAPSULE | Freq: Every day | ORAL | Status: DC

## 2014-01-19 NOTE — Assessment & Plan Note (Signed)
She continues to have pain and swelling in the left arm despite the addition of cochicine. Given the report of the swelling, will check a d-dimer. I think a blood clot is unlikely. I will call her with the results of this blood test. I think CRPS is a possibility. I also think she may have some shoulder pathology given the temporary improvement following steroid injection. Ideally, I would refer her to orthopedics for further evaluation of her neck and left shoulder. However, currently she is without insurance while she waits to see if she gets Medicaid. Will start Cymbalta 30 mg once a day. Provided increased Norco to 75 tablets a month. Will plan on backing off on this next month back to 60 tablets a month. Followup in 2 weeks.

## 2014-01-19 NOTE — Assessment & Plan Note (Signed)
Pap collected today. 

## 2014-01-19 NOTE — Progress Notes (Signed)
   Subjective:    Patient ID: Sandra Briggs, female    DOB: April 23, 1976, 38 y.o.   MRN: [id number]  HPI Sandra Briggs is here for followup neck and Pap smear.  Neck pain She states that her shoulder felt quite a bit better after the steroid injection. However, this only lasted one week. She continues to have significant pain in the neck, and difficulty turning her head to the left. She also has had intermittent swelling in the left arm and continues to have worse than normal radicular pain in the left arm. She has been using increased amounts of Norco due to this pain. She is very frustrated at this time.  Pap smear She denies any vaginal discharge. She has not really have periods anymore since she has had an ablation. She had her tubes tied several years ago for birth control. She does have a history of abnormal Paps in her late teens. It sounds like she has had cryosurgery and possibly a LEEP procedure in the past. She states her last few Pap smears have been normal. Pap smears in 2006, 2009, 2011 were normal.  Current Outpatient Prescriptions on File Prior to Visit  Medication Sig Dispense Refill  . acetaminophen (TYLENOL) 500 MG tablet Take 1,000 mg by mouth every 6 (six) hours as needed. Patient used this medication for neck pain.      Marland Kitchen alprazolam (XANAX) 2 MG tablet Take 1/4 tablet every 6 hours as needed for anxiety.  30 tablet  0  . colchicine 0.6 MG tablet Take 1 tablet (0.6 mg total) by mouth 2 (two) times daily.  60 tablet  1  . cyclobenzaprine (FLEXERIL) 5 MG tablet Take 1 tablet (5 mg total) by mouth 3 (three) times daily as needed for muscle spasms.  30 tablet  2  . gabapentin (NEURONTIN) 300 MG capsule Take 2 capsules (600 mg total) by mouth 3 (three) times daily.  180 capsule  3  . ibuprofen (ADVIL,MOTRIN) 800 MG tablet Take 800 mg by mouth every 8 (eight) hours as needed for pain.      Marland Kitchen lamoTRIgine (LAMICTAL) 200 MG tablet Take 1 tablet (200 mg total) by mouth daily. Taking for  bi-polar disorder  60 tablet  6   No current facility-administered medications on file prior to visit.    I have reviewed and updated the following as appropriate: allergies and current medications SHx: former smoker  Health Maintenance: pap done today  Review of Systems See HPI    Objective:   Physical Exam BP 136/90  Pulse 103  Temp(Src) 98.6 F (37 C) (Oral)  Ht 5\' 4"  (1.626 m)  Wt 179 lb 8 oz (81.421 kg)  BMI 30.80 kg/m2 Gen: alert, cooperative, tearful when discussing her pain Left arm: mild swelling in her hand Pelvic: normal external genitalia; normal vaginal mucosa, minimal white discharge present; cervix normal appearing but stenotic (unable to insert cytobrush)      Assessment & Plan:

## 2014-01-19 NOTE — Patient Instructions (Signed)
It was nice to see you!  We are going to start cymbalta 30mg  daily for the neck and arm. Continue to use the Norco as needed.  I will call you with the results of your the d-dimer test as soon as I get the result back.  You will get a letter in the mail with the results of your pap smear.  You should probably get a pap smear every year for the next 2-3 years.  Follow up with me in 2 weeks.

## 2014-01-19 NOTE — Telephone Encounter (Signed)
Called and informed patient of negative D-dimer results.  This makes blood clot extremely unlikely.

## 2014-01-21 LAB — CYTOLOGY - PAP

## 2014-01-25 ENCOUNTER — Encounter: Payer: Self-pay | Admitting: Emergency Medicine

## 2014-02-02 ENCOUNTER — Encounter: Payer: Self-pay | Admitting: Emergency Medicine

## 2014-02-02 ENCOUNTER — Ambulatory Visit (INDEPENDENT_AMBULATORY_CARE_PROVIDER_SITE_OTHER): Payer: Self-pay | Admitting: Emergency Medicine

## 2014-02-02 VITALS — BP 117/82 | HR 82 | Temp 98.1°F | Wt 179.1 lb

## 2014-02-02 DIAGNOSIS — K130 Diseases of lips: Secondary | ICD-10-CM | POA: Insufficient documentation

## 2014-02-02 DIAGNOSIS — M542 Cervicalgia: Secondary | ICD-10-CM

## 2014-02-02 NOTE — Assessment & Plan Note (Signed)
This is stable from previous. I'm starting to question if some of her pain is coming from shoulder pathology, especially given her temporary improvement with injection. Continue Cymbalta 30 mg daily as she thinks this might be helping some. Ideally, I would like to refer her to orthopedics for additional evaluation. Once her insurance situation is straightened out we will place that referral. If things should worsen or change, she will return for a visit. Followup in 2 weeks for her normal medication refill appointment.

## 2014-02-02 NOTE — Assessment & Plan Note (Signed)
2 small hyperpigmented lesions on the inner lower lip. She just noticed these a week or so ago, but has not noticed any changes in that time frame. She does have multiple freckles on her face, chest, and arms. However, she is a former smoker. We will monitor these for now. If there are any changes we will need to obtain a biopsy. I think getting a biopsy sometime down the road would be very reasonable, but with her insurance status that is not possible right now.

## 2014-02-02 NOTE — Progress Notes (Signed)
   Subjective:    Patient ID: Sandra Briggs, female    DOB: February 24, 1976, 38 y.o.   MRN: [id number]  HPI Sandra Briggs is here for followup left neck and arm pain.  She states this is about the same as previous. She continues to have an achy type pain in the posterior shoulder and down her arm, particularly with overhead movements. She states that when we did the shoulder injection, and she had quite a bit of improvement in her pain but it only lasted about 24 hours. Denies any worsening numbness, tingling, weakness.  She also mentions that she has some dark spots on her inner lower lip. She just noticed these in the last week or so.  Current Outpatient Prescriptions on File Prior to Visit  Medication Sig Dispense Refill  . acetaminophen (TYLENOL) 500 MG tablet Take 1,000 mg by mouth every 6 (six) hours as needed. Patient used this medication for neck pain.      Marland Kitchen alprazolam (XANAX) 2 MG tablet Take 1/4 tablet every 6 hours as needed for anxiety.  30 tablet  0  . colchicine 0.6 MG tablet Take 1 tablet (0.6 mg total) by mouth 2 (two) times daily.  60 tablet  1  . cyclobenzaprine (FLEXERIL) 5 MG tablet Take 1 tablet (5 mg total) by mouth 3 (three) times daily as needed for muscle spasms.  30 tablet  2  . DULoxetine (CYMBALTA) 30 MG capsule Take 1 capsule (30 mg total) by mouth daily.  30 capsule  3  . gabapentin (NEURONTIN) 300 MG capsule Take 2 capsules (600 mg total) by mouth 3 (three) times daily.  180 capsule  3  . HYDROcodone-acetaminophen (NORCO) 10-325 MG per tablet Take 1 tablet by mouth every 8 (eight) hours as needed.  75 tablet  0  . ibuprofen (ADVIL,MOTRIN) 800 MG tablet Take 800 mg by mouth every 8 (eight) hours as needed for pain.      Marland Kitchen lamoTRIgine (LAMICTAL) 200 MG tablet Take 1 tablet (200 mg total) by mouth daily. Taking for bi-polar disorder  60 tablet  6   No current facility-administered medications on file prior to visit.    I have reviewed and updated the following as  appropriate: allergies and current medications SHx: former smoker  Health Maintenance: up to date   Review of Systems See HPI    Objective:   Physical Exam BP 117/82  Pulse 82  Temp(Src) 98.1 F (36.7 C) (Oral)  Wt 179 lb 1.6 oz (81.239 kg) Gen: alert, cooperative, NAD Left arm: No swelling or erythema; she is still reluctant to move it, particularly above shoulder level. Mouth: 2 hyperpigmented lesion on her inner lower lip; on the right side 0.1x0.3cm; on the left side 0.3x-0.3cm     Assessment & Plan:

## 2014-02-07 ENCOUNTER — Encounter: Payer: Self-pay | Admitting: Emergency Medicine

## 2014-02-08 ENCOUNTER — Encounter: Payer: Self-pay | Admitting: Emergency Medicine

## 2014-02-08 ENCOUNTER — Ambulatory Visit (INDEPENDENT_AMBULATORY_CARE_PROVIDER_SITE_OTHER): Payer: Self-pay | Admitting: Emergency Medicine

## 2014-02-08 VITALS — BP 113/76 | HR 88 | Temp 98.7°F | Ht 64.0 in | Wt 175.0 lb

## 2014-02-08 DIAGNOSIS — M542 Cervicalgia: Secondary | ICD-10-CM

## 2014-02-08 DIAGNOSIS — F410 Panic disorder [episodic paroxysmal anxiety] without agoraphobia: Secondary | ICD-10-CM

## 2014-02-08 DIAGNOSIS — M1120 Other chondrocalcinosis, unspecified site: Secondary | ICD-10-CM

## 2014-02-08 MED ORDER — ALPRAZOLAM 2 MG PO TABS: ORAL_TABLET | ORAL | Status: DC

## 2014-02-08 MED ORDER — HYDROCODONE-ACETAMINOPHEN 10-325 MG PO TABS: 1.0000 | ORAL_TABLET | Freq: Three times a day (TID) | ORAL | Status: DC | PRN

## 2014-02-08 NOTE — Assessment & Plan Note (Signed)
This has been improved with the increased dose of gabapentin. Provided prescription for Xanax #30 tablets today. Followup as needed.

## 2014-02-08 NOTE — Assessment & Plan Note (Signed)
This is stable for the time being. Refilled Norco 10-325 mg #60 tablets. If things remain stable next month, I would recommend returning to 3 months of prescriptions supplied at a time.

## 2014-02-08 NOTE — Progress Notes (Signed)
   Subjective:    Patient ID: Tenna Child, female    DOB: 1976/05/12, 38 y.o.   MRN: [id number]  HPI Allyn R Michaelis is here for medication refill.  She is on Norco 10/325 for chronic radicular neck pain. She had previously been stable with 45 tablets a month. Over the last few months she has had acute worsening of this pain, with likely new diagnosis of pseudogout. Last month she had required 75 tablets. Today we discussed cutting back, and she was good with going back to 60 tablets this month and is hopeful to taper down further next month.  She is also on Xanax as needed for anxiety attacks. She states she has not needed any Xanax for at least the last 2 months. She has not even filled the last 2 prescriptions. She does like having the Xanax available, just in case she has a panic attack. She tried to fill the prescription I gave her last month, however the pharmacist told her it had expired.  Current Outpatient Prescriptions on File Prior to Visit  Medication Sig Dispense Refill  . acetaminophen (TYLENOL) 500 MG tablet Take 1,000 mg by mouth every 6 (six) hours as needed. Patient used this medication for neck pain.      Marland Kitchen colchicine 0.6 MG tablet Take 1 tablet (0.6 mg total) by mouth 2 (two) times daily.  60 tablet  1  . cyclobenzaprine (FLEXERIL) 5 MG tablet Take 1 tablet (5 mg total) by mouth 3 (three) times daily as needed for muscle spasms.  30 tablet  2  . DULoxetine (CYMBALTA) 30 MG capsule Take 1 capsule (30 mg total) by mouth daily.  30 capsule  3  . gabapentin (NEURONTIN) 300 MG capsule Take 2 capsules (600 mg total) by mouth 3 (three) times daily.  180 capsule  3  . ibuprofen (ADVIL,MOTRIN) 800 MG tablet Take 800 mg by mouth every 8 (eight) hours as needed for pain.      Marland Kitchen lamoTRIgine (LAMICTAL) 200 MG tablet Take 1 tablet (200 mg total) by mouth daily. Taking for bi-polar disorder  60 tablet  6   No current facility-administered medications on file prior to visit.    I have  reviewed and updated the following as appropriate: allergies and current medications SHx: former smoker  Review of Systems See HPI    Objective:   Physical Exam BP 113/76  Pulse 88  Temp(Src) 98.7 F (37.1 C) (Oral)  Ht 5\' 4"  (1.626 m)  Wt 175 lb (79.379 kg)  BMI 30.02 kg/m2 Gen: alert, cooperative, NAD HEENT: AT/[place], sclera white, MMM Neck: supple, tender over right para-cervical muscles     Assessment & Plan:

## 2014-02-08 NOTE — Patient Instructions (Signed)
It was nice to see you!  We will see you next month.  Hopefully, if things are still stable, we can go back to 2 or 3 months.

## 2014-02-16 ENCOUNTER — Telehealth: Payer: Self-pay | Admitting: Family Medicine

## 2014-02-16 NOTE — Telephone Encounter (Signed)
Hydrocodone, alprazalom are to be refilled tomorrow. She is leaving to go out of town tonight. She would like to have it approved to fill it a day early Please advise

## 2014-02-17 NOTE — Telephone Encounter (Signed)
I did not get copied on this message until today, so this should no longer be a concern. Leeanne Rio, MD

## 2014-02-24 ENCOUNTER — Emergency Department (HOSPITAL_COMMUNITY): Payer: Self-pay

## 2014-02-24 ENCOUNTER — Encounter (HOSPITAL_COMMUNITY): Payer: Self-pay | Admitting: Emergency Medicine

## 2014-02-24 ENCOUNTER — Emergency Department (HOSPITAL_COMMUNITY)
Admission: EM | Admit: 2014-02-24 | Discharge: 2014-02-24 | Disposition: A | Discharge status: Home / Self Care | Payer: Self-pay | Attending: Emergency Medicine | Admitting: Emergency Medicine

## 2014-02-24 DIAGNOSIS — R0789 Other chest pain: Secondary | ICD-10-CM | POA: Diagnosis present

## 2014-02-24 DIAGNOSIS — Z87828 Personal history of other (healed) physical injury and trauma: Secondary | ICD-10-CM | POA: Insufficient documentation

## 2014-02-24 DIAGNOSIS — J45901 Unspecified asthma with (acute) exacerbation: Secondary | ICD-10-CM | POA: Insufficient documentation

## 2014-02-24 DIAGNOSIS — Z87891 Personal history of nicotine dependence: Secondary | ICD-10-CM | POA: Insufficient documentation

## 2014-02-24 DIAGNOSIS — Z87448 Personal history of other diseases of urinary system: Secondary | ICD-10-CM | POA: Insufficient documentation

## 2014-02-24 DIAGNOSIS — G8929 Other chronic pain: Secondary | ICD-10-CM | POA: Insufficient documentation

## 2014-02-24 DIAGNOSIS — Z79899 Other long term (current) drug therapy: Secondary | ICD-10-CM | POA: Insufficient documentation

## 2014-02-24 DIAGNOSIS — F410 Panic disorder [episodic paroxysmal anxiety] without agoraphobia: Secondary | ICD-10-CM | POA: Insufficient documentation

## 2014-02-24 HISTORY — DX: Anxiety disorder, unspecified: F41.9

## 2014-02-24 LAB — CBC
HCT: 39.1 % (ref 36.0–46.0)
Hemoglobin: 13.5 g/dL (ref 12.0–15.0)
MCH: 31.8 pg (ref 26.0–34.0)
MCHC: 34.5 g/dL (ref 30.0–36.0)
MCV: 92.2 fL (ref 78.0–100.0)
Platelets: 180 10*3/uL (ref 150–400)
RBC: 4.24 MIL/uL (ref 3.87–5.11)
RDW: 11.8 % (ref 11.5–15.5)
WBC: 11.2 10*3/uL — ABNORMAL HIGH (ref 4.0–10.5)

## 2014-02-24 LAB — BASIC METABOLIC PANEL
Anion gap: 15 (ref 5–15)
BUN: 11 mg/dL (ref 6–23)
CO2: 24 mEq/L (ref 19–32)
Calcium: 9.1 mg/dL (ref 8.4–10.5)
Chloride: 102 mEq/L (ref 96–112)
Creatinine, Ser: 0.73 mg/dL (ref 0.50–1.10)
GFR calc Af Amer: >90 mL/min (ref >90)
GFR calc non Af Amer: >90 mL/min (ref >90)
Glucose, Bld: 110 mg/dL — ABNORMAL HIGH (ref 70–99)
POTASSIUM: 3.7 meq/L (ref 3.7–5.3)
SODIUM: 141 meq/L (ref 137–147)

## 2014-02-24 LAB — D-DIMER, QUANTITATIVE (NOT AT ARMC)

## 2014-02-24 LAB — PRO B NATRIURETIC PEPTIDE: PRO B NATRI PEPTIDE: 37.9 pg/mL (ref 0–125)

## 2014-02-24 LAB — I-STAT TROPONIN, ED: Troponin i, poc: 0.01 ng/mL (ref 0.00–0.08)

## 2014-02-24 MED ORDER — OXYCODONE-ACETAMINOPHEN 5-325 MG PO TABS: 1.0000 | ORAL_TABLET | Freq: Four times a day (QID) | ORAL | Status: DC | PRN

## 2014-02-24 NOTE — ED Notes (Signed)
Pt. reports mid chest pressure with SOB onset this evening worse with deep inspiration and talking  , denies cough /nausea or diaphoresis .

## 2014-02-24 NOTE — Discharge Instructions (Signed)

## 2014-02-24 NOTE — ED Provider Notes (Signed)
CSN: [id number]     Arrival date & time 02/24/14  0129 History   First MD Initiated Contact with Patient 02/24/14 0232     Chief Complaint  Patient presents with  . Chest Pain     (Consider location/radiation/quality/duration/timing/severity/associated sxs/prior Treatment) Patient is a 38 y.o. female presenting with chest pain. The history is provided by the patient.  Chest Pain Pain location:  Substernal area Pain quality: pressure and sharp   Pain radiates to:  Neck Pain radiates to the back: no   Pain severity:  Moderate Onset quality:  Sudden Duration:  3 hours Timing:  Intermittent Progression:  Improving Chronicity:  New Context: at rest   Relieved by:  Nothing Worsened by:  Nothing tried Ineffective treatments:  None tried Associated symptoms: shortness of breath   Associated symptoms: no abdominal pain, no back pain, no cough, no dizziness, no fatigue, no fever, no headache, no nausea and not vomiting     Past Medical History  Diagnosis Date  . Exercise-induced asthma   . Panic disorder   . Dysuria-frequency syndrome   . Chronic pain     flank  . Sprain of MCL (medial collateral ligament) of knee   . Anxiety    Past Surgical History  Procedure Laterality Date  . Tubal ligation  06/28/2005  . Endometrial ablation  2009  . Cesarean section    . Appendectomy     Family History  Problem Relation Age of Onset  . Lung cancer Mother   . Fibroids Sister   . Hyperlipidemia Maternal Grandmother    History  Substance Use Topics  . Smoking status: Former Smoker    Types: Cigarettes    Quit date: 02/27/2011  . Smokeless tobacco: Former Systems developer    Quit date: 10/22/2010  . Alcohol Use: 0.0 oz/week     Comment: rare   OB History   Grav Para Term Preterm Abortions TAB SAB Ect Mult Living                 Review of Systems  Constitutional: Negative for fever and fatigue.  HENT: Negative for congestion and drooling.   Eyes: Negative for pain.  Respiratory:  Positive for shortness of breath. Negative for cough.   Cardiovascular: Positive for chest pain.  Gastrointestinal: Negative for nausea, vomiting, abdominal pain and diarrhea.  Genitourinary: Negative for dysuria and hematuria.  Musculoskeletal: Negative for back pain, gait problem and neck pain.  Skin: Negative for color change.  Neurological: Negative for dizziness and headaches.  Hematological: Negative for adenopathy.  Psychiatric/Behavioral: Negative for behavioral problems.  All other systems reviewed and are negative.     Allergies  Sulfonamide derivatives; Bee venom; and Tramadol hcl  Home Medications   Prior to Admission medications   Medication Sig Start Date End Date Taking? Authorizing Provider  acetaminophen (TYLENOL) 500 MG tablet Take 1,000 mg by mouth every 6 (six) hours as needed. Patient used this medication for neck pain.    Historical Provider, MD  alprazolam Duanne Moron) 2 MG tablet Take 1/4 tablet every 6 hours as needed for anxiety. 02/08/14   Melony Overly, MD  colchicine 0.6 MG tablet Take 1 tablet (0.6 mg total) by mouth 2 (two) times daily. 12/30/13   Melony Overly, MD  cyclobenzaprine (FLEXERIL) 5 MG tablet Take 1 tablet (5 mg total) by mouth 3 (three) times daily as needed for muscle spasms. 03/10/13   Melony Overly, MD  DULoxetine (CYMBALTA) 30 MG capsule Take 1 capsule (30  mg total) by mouth daily. 01/19/14   Melony Overly, MD  gabapentin (NEURONTIN) 300 MG capsule Take 2 capsules (600 mg total) by mouth 3 (three) times daily. 12/30/13   Melony Overly, MD  HYDROcodone-acetaminophen (NORCO) 10-325 MG per tablet Take 1 tablet by mouth every 8 (eight) hours as needed. 02/08/14   Melony Overly, MD  ibuprofen (ADVIL,MOTRIN) 800 MG tablet Take 800 mg by mouth every 8 (eight) hours as needed for pain.    Historical Provider, MD  lamoTRIgine (LAMICTAL) 200 MG tablet Take 1 tablet (200 mg total) by mouth daily. Taking for bi-polar disorder 11/17/13   Melony Overly, MD   BP 120/70   Pulse 90  Temp(Src) 98.1 F (36.7 C) (Oral)  Resp 18  SpO2 97% Physical Exam  Nursing note and vitals reviewed. Constitutional: She is oriented to person, place, and time. She appears well-developed and well-nourished.  HENT:  Head: Normocephalic and atraumatic.  Mouth/Throat: Oropharynx is clear and moist. No oropharyngeal exudate.  Eyes: Conjunctivae and EOM are normal. Pupils are equal, round, and reactive to light.  Neck: Normal range of motion. Neck supple.  Cardiovascular: Normal rate, regular rhythm, normal heart sounds and intact distal pulses.  Exam reveals no gallop and no friction rub.   No murmur heard. Pulmonary/Chest: Effort normal and breath sounds normal. No respiratory distress. She has no wheezes.  Abdominal: Soft. Bowel sounds are normal. There is no tenderness. There is no rebound and no guarding.  Musculoskeletal: Normal range of motion. She exhibits no edema and no tenderness.  Neurological: She is alert and oriented to person, place, and time.  Skin: Skin is warm and dry.  Psychiatric: She has a normal mood and affect. Her behavior is normal.    ED Course  Procedures (including critical care time) Labs Review Labs Reviewed  CBC - Abnormal; Notable for the following:    WBC 11.2 (*)    All other components within normal limits  BASIC METABOLIC PANEL - Abnormal; Notable for the following:    Glucose, Bld 110 (*)    All other components within normal limits  PRO B NATRIURETIC PEPTIDE  D-DIMER, QUANTITATIVE  I-STAT TROPOININ, ED    Imaging Review Dg Chest 1 View  02/24/2014   CLINICAL DATA:  Chest pain for a few hr.  EXAM: CHEST - 1 VIEW  COMPARISON:  11/08/2012  FINDINGS: The heart size and mediastinal contours are within normal limits. Linear fibrosis in the left lung base. No focal airspace disease or consolidation in the lungs. The visualized skeletal structures are unremarkable.  IMPRESSION: No active disease.   Electronically Signed   By: Lucienne Capers M.D.   On: 02/24/2014 02:30     EKG Interpretation   Date/Time:  Thursday February 24 2014 01:33:42 EDT Ventricular Rate:  96 PR Interval:  154 QRS Duration: 104 QT Interval:  358 QTC Calculation: 452 R Axis:   14 Text Interpretation:  Normal sinus rhythm Incomplete right bundle branch  block No significant change since last tracing Confirmed by Oaklan Persons  MD,  Tennelle Taflinger ([id number]) on 02/24/2014 1:50:29 AM      MDM   Final diagnoses:  Atypical chest pain    2:33 AM 38 y.o. female with a history of anxiety on Xanax presents with sudden onset pleuritic chest pain and shortness of breath which began at 73 PM yesterday evening. She states that she had been cleaning a new house all day. Her symptoms began suddenly while at rest watching  TV. They have greatly improved since then. She is afebrile and vital signs are unremarkable here. Will get screening d-dimer and labs/imaging. Will outpatient take one of her Xanax for anxiety as she is tearful on exam.  3:44 AM: I interpreted/reviewed the labs and/or imaging which were non-contributory.  D-dimer neg. Pt's sx resolving after xanax. Possibly a component of anxiety.  I have discussed the diagnosis/risks/treatment options with the patient and believe the pt to be eligible for discharge home to follow-up with pcp as needed. We also discussed returning to the ED immediately if new or worsening sx occur. We discussed the sx which are most concerning (e.g., worsening pain, fever, sob) that necessitate immediate return. Medications administered to the patient during their visit and any new prescriptions provided to the patient are listed below.  Medications given during this visit Medications - No data to display  New Prescriptions   OXYCODONE-ACETAMINOPHEN (PERCOCET) 5-325 MG PER TABLET    Take 1 tablet by mouth every 6 (six) hours as needed for moderate pain.     Blanchard Kelch, MD 02/24/14 [phone]

## 2014-03-03 ENCOUNTER — Ambulatory Visit (INDEPENDENT_AMBULATORY_CARE_PROVIDER_SITE_OTHER): Payer: Self-pay | Admitting: Family Medicine

## 2014-03-03 ENCOUNTER — Encounter: Payer: Self-pay | Admitting: Family Medicine

## 2014-03-03 VITALS — BP 106/76 | HR 89 | Ht 64.0 in | Wt 174.0 lb

## 2014-03-03 DIAGNOSIS — F410 Panic disorder [episodic paroxysmal anxiety] without agoraphobia: Secondary | ICD-10-CM

## 2014-03-03 DIAGNOSIS — R0602 Shortness of breath: Secondary | ICD-10-CM

## 2014-03-03 DIAGNOSIS — M1120 Other chondrocalcinosis, unspecified site: Secondary | ICD-10-CM

## 2014-03-03 DIAGNOSIS — M542 Cervicalgia: Secondary | ICD-10-CM

## 2014-03-03 MED ORDER — ALBUTEROL SULFATE HFA 108 (90 BASE) MCG/ACT IN AERS
2.0000 | INHALATION_SPRAY | Freq: Four times a day (QID) | RESPIRATORY_TRACT | Status: DC | PRN
Start: 2014-03-03 — End: 2016-05-13

## 2014-03-03 MED ORDER — HYDROCODONE-ACETAMINOPHEN 10-325 MG PO TABS
1.0000 | ORAL_TABLET | Freq: Three times a day (TID) | ORAL | Status: DC | PRN
Start: 2014-03-03 — End: 2014-04-11

## 2014-03-03 MED ORDER — CETIRIZINE HCL 10 MG PO TABS: 10.0000 mg | ORAL_TABLET | Freq: Every day | ORAL | Status: DC

## 2014-03-03 NOTE — Patient Instructions (Signed)
It was nice to meet you today!  I refilled your norco. For shortness of breath, start zyrtec 10mg  daily. Use albuterol as needed. Follow up in 2-3 weeks to see if you are feeling better with this. If not, we may have to do some more testing.  Be well, Dr. [name]

## 2014-03-03 NOTE — Progress Notes (Signed)
Patient ID: Sandra Briggs, female   DOB: November 15, 1975, 38 y.o.   MRN: [id number]  HPI:  Shortness of breath - went to ER because had shortness of breath. Ruled out for PE with negative d-dimer. Still gets winded with walking. No swelling in her legs, no waking up at night short of breath. Previously had some pain with cough, describes it as a pinch-type pain. This still occurs with coughing. Cough is nonproductive. No fevers. No hx of heart problems. Used to smoke, quit 2 years ago, prior to that had smoked for 15 years x 1 ppd. Doesn't think this is due to anxiety. Has some nasal congestion as well.  Bipolar & panic disorder - takes 0.5mg  xanax as needed. Has only taken this once in the last month. Mood stable, no SI/HI. Bipolar disorder well controlled according to patient, with lamictal and gabapentin.  Neck pain - takes flexeril about twice per month. Only uses this if has spasms. Takes gabapentin 300mg  TID. Takes norco one in morning, one at night, occasionally takes in the afternoon as well. Takes ibuprofen 800mg  at least once per day. Neck pain well controlled with medicine she's on. Takes colchicine for pseudogout.  ROS: See HPI  [place]: mom died of lung cancer. Hx bipolar disorder, panic disorder, chronic neck pain, pseudogout, allergic rhinitis.  PHYSICAL EXAM: BP 106/76  Pulse 89  Ht 5\' 4"  (1.626 m)  Wt 174 lb (78.926 kg)  BMI 29.85 kg/m2  SpO2 97% Ambulated around clinic with O2 sat 95 and higher on room air Gen: NAD HEENT: NCAT, TM's clear bilat Heart: RRR, no murmurs Lungs: CTAB, NWOB, cough with deep inspiration Neuro: grossly nonfocal, speech normal Ext: No appreciable lower extremity edema bilaterally  Back: posterior neck nontender to palpation. Full strength in extremities.  ASSESSMENT/PLAN:  See problem based charting for additional assessment/plan.  FOLLOW UP: F/u in 2-3 weeks for shortness of breath F/u 1 month for chronic pain med refill  [name],  [place]

## 2014-03-04 DIAGNOSIS — R0602 Shortness of breath: Secondary | ICD-10-CM | POA: Insufficient documentation

## 2014-03-04 NOTE — Assessment & Plan Note (Signed)
Well controlled on current pain regimen. Needs norco refilled. Given refill x 1 month #60 tablets. Continue gabapentin, prn flexeril, ibuprofen. F/u in 1 month for another refill.

## 2014-03-04 NOTE — Assessment & Plan Note (Signed)
Well controlled, continue current management

## 2014-03-04 NOTE — Assessment & Plan Note (Addendum)
Ddx broad, including asthma, GERD, COPD, CHF, anxiety. At this time favor allergic/asthma etiology by history and exam, also documented hx of allergic rhinitis. No wheezes on exam today. Suspect may have postnasal drip contributing to cough and accompanying subjective shortness of breath. Ruled out for PE in ER with negative d-dimer. Ambulated today in clinic without hypoxia. Will start zyrtec 10mg  daily and albuterol inhaler prn. F/u in 2-3 weeks to reassess. If no improvement with these measures, may need to do additional testing (possibly echo).  Precepted with Dr. Andria Frames who agrees with this plan.

## 2014-03-16 ENCOUNTER — Encounter: Payer: Self-pay | Admitting: Family Medicine

## 2014-03-16 ENCOUNTER — Telehealth: Payer: Self-pay | Admitting: *Deleted

## 2014-03-16 ENCOUNTER — Ambulatory Visit (INDEPENDENT_AMBULATORY_CARE_PROVIDER_SITE_OTHER): Payer: Self-pay | Admitting: Family Medicine

## 2014-03-16 VITALS — BP 112/78 | HR 91 | Temp 98.1°F | Ht 64.0 in | Wt 170.8 lb

## 2014-03-16 DIAGNOSIS — R0602 Shortness of breath: Secondary | ICD-10-CM

## 2014-03-16 DIAGNOSIS — R0989 Other specified symptoms and signs involving the circulatory and respiratory systems: Secondary | ICD-10-CM

## 2014-03-16 DIAGNOSIS — R0600 Dyspnea, unspecified: Secondary | ICD-10-CM

## 2014-03-16 DIAGNOSIS — R0609 Other forms of dyspnea: Secondary | ICD-10-CM

## 2014-03-16 LAB — TSH: TSH: 1.717 u[IU]/mL (ref 0.350–4.500)

## 2014-03-16 NOTE — Patient Instructions (Signed)
We are checking your thyroid today Also getting an ultrasound of your heart (echo) Schedule an appointment with Dr. Valentina Lucks to have pulmonary function testing done (this can be done here)  Return to see me in 2 weeks.  Be well, Dr. [name]

## 2014-03-16 NOTE — Assessment & Plan Note (Signed)
Etiology not clear. Ddx includes atypical presentation of panic disorder (which pt already has dx of), asthma, CHF, and other more rare conditions such as pheochromocytoma. Will start workup with TSH, PFT's, echo. If this workup unrevealing, consider additional psychiatric evaluation versus additional cardiac testing. Precepted with Dr. Mingo Amber who agrees with this plan.

## 2014-03-16 NOTE — Telephone Encounter (Signed)
Pt notified of appt on Thursday July 30th @ 9:00am [facility] [place] [facility], 1st floor). [place], [place] CMA

## 2014-03-16 NOTE — Progress Notes (Signed)
Patient ID: Sandra Briggs, female   DOB: February 03, 1976, 38 y.o.   MRN: [id number]   HPI:  Pt presents to f/u on her shortness of breath. At last visit we started zyrtec and prn albuterol. Allergy sx's (cough, itchy eyes, congestion) have improved with zyrtec. Still getting SOB. Gets winded with exertion. Also describes having episodes happening about once a day, lasting 20-30 mins at a time, during which she has a feeling of flushing/wave of heat, accompanied by chest pressure, shortness of breath, frontal headache, and throat tingling. Sometimes feels like throat is swelling but states she knows that it isn't. No shortness of breath with exertion. No lower extremity edema. States these episodes feel very different than her usual panic attacks.  ROS: See HPI.  [place]: hx bipolar disorder, panic disorder, allergic rhinitis, chronic neck pain  PHYSICAL EXAM: BP 112/78  Pulse 91  Temp(Src) 98.1 F (36.7 C) (Oral)  Ht 5\' 4"  (1.626 m)  Wt 170 lb 12.8 oz (77.474 kg)  BMI 29.30 kg/m2 Gen: NAD HEENT: NCAT, PERRL, EOMI Heart: RRR no murmurs Lungs: CTAB, NWOB Abd: mild TTP RLQ, soft, no masses or organomegaly Neuro: nonfocal, speech normal, full strength all extremities Ext: No appreciable lower extremity edema bilaterally   ASSESSMENT/PLAN:  Shortness of breath Etiology not clear. Ddx includes atypical presentation of panic disorder (which pt already has dx of), asthma, CHF, and other more rare conditions such as pheochromocytoma. Will start workup with TSH, PFT's, echo. If this workup unrevealing, consider additional psychiatric evaluation versus additional cardiac testing. Precepted with Dr. Mingo Amber who agrees with this plan.    FOLLOW UP: F/u in 2 weeks for shortness of breath.  [place]. [name], [place]

## 2014-03-17 ENCOUNTER — Ambulatory Visit (HOSPITAL_COMMUNITY)
Admission: RE | Admit: 2014-03-17 | Discharge: 2014-03-17 | Disposition: A | Discharge status: Home / Self Care | Payer: Self-pay | Source: Ambulatory Visit | Attending: Family Medicine | Admitting: Family Medicine

## 2014-03-17 ENCOUNTER — Encounter: Payer: Self-pay | Admitting: Family Medicine

## 2014-03-17 DIAGNOSIS — R0609 Other forms of dyspnea: Secondary | ICD-10-CM | POA: Insufficient documentation

## 2014-03-17 DIAGNOSIS — R0600 Dyspnea, unspecified: Secondary | ICD-10-CM

## 2014-03-17 DIAGNOSIS — R0989 Other specified symptoms and signs involving the circulatory and respiratory systems: Principal | ICD-10-CM | POA: Insufficient documentation

## 2014-03-17 NOTE — Progress Notes (Signed)
  Echocardiogram 2D Echocardiogram has been performed.  Sandra Briggs 03/17/2014, 10:02 AM

## 2014-04-11 ENCOUNTER — Encounter: Payer: Self-pay | Admitting: Family Medicine

## 2014-04-11 ENCOUNTER — Ambulatory Visit (INDEPENDENT_AMBULATORY_CARE_PROVIDER_SITE_OTHER): Payer: Self-pay | Admitting: Family Medicine

## 2014-04-11 VITALS — BP 111/78 | HR 80 | Temp 98.1°F | Ht 64.0 in | Wt 169.7 lb

## 2014-04-11 DIAGNOSIS — M542 Cervicalgia: Secondary | ICD-10-CM

## 2014-04-11 DIAGNOSIS — M1120 Other chondrocalcinosis, unspecified site: Secondary | ICD-10-CM

## 2014-04-11 DIAGNOSIS — R0602 Shortness of breath: Secondary | ICD-10-CM

## 2014-04-11 MED ORDER — HYDROCODONE-ACETAMINOPHEN 10-325 MG PO TABS: 1.0000 | ORAL_TABLET | Freq: Three times a day (TID) | ORAL | Status: DC | PRN

## 2014-04-11 NOTE — Patient Instructions (Signed)
It was great to see you again today!  For shortness of breath -schedule an appointment with Dr. Valentina Lucks for pulmonary function testing -try taking a dose of xanax (1/4 tablet) next time you have the shortness of breath, this will help [place] see if this is anxiety -follow up with me 1 week after you see Dr. Valentina Lucks  For neck pain -I refilled your medicine and gave you 70 pills instead of 60. We will need to decrease back down next time.  Call if you have any problems in the meantime.  Be well, Dr. [name]

## 2014-04-13 NOTE — Assessment & Plan Note (Signed)
Etiology remains unclear, although reassuring that it is mildly improved. Echo was normal. Has not yet had PFT's. I suspect this is anxiety-related but want to do a little more investigating before we label it as a panic attack. Plan -schedule visit for PFT's with Dr. Valentina Lucks -pt to try a one time dose of her home xanax when this happens to see if she gets relief. She is appropriately weary of regular benzo use, but we will just use this as a one-time trial. If SOB improves with xanax, will focus our treatment more on her anxiety. -if the above testing is unrevealing, will likely set up for a 24-48 hour holter monitor to evaluate for transient arrhythmia which could be contributing.

## 2014-04-13 NOTE — Assessment & Plan Note (Addendum)
Worsened lately. Will slightly increase quantity of norco to 70 tablets per month. Advised we will need to back down on this next time. Pt agreeable to plan. F/u in 1 month.

## 2014-04-13 NOTE — Progress Notes (Signed)
Patient ID: Sandra Briggs, female   DOB: Jan 17, 1976, 38 y.o.   MRN: [id number]  HPI:  Pt presents to f/u on shortness of breath and also for refill of pain medication.  Chronic neck pain - takes norco two to 3 times per day. Also takes ibuprofen 800mg  every morning if she needs it. Takes flexeril on occasion at night if she needs it, states she is very sensitive to this as it makes her sleepy. Pain has flared up more in the last several weeks and she states for the first time ever she will run out of her medicine early.  Shortness of breath - thinks this is getting a little better. Still gets a dizzy sensation about once a day which is associated with shortness of breath that lasts a couple of hours. She thinks this could be her anxiety. She uses albuterol 2 puffs daily, thinks this helps. Also taking zyrtec for allergies which also helps. Has not had to use xanax for panic attack in several months. No lower extremity edema. No chest pain. No thoughts of hurting herself or others.  ROS: See HPI.  [place]: hx bipolar disorder, panic disorder, allergic rhinitis, chronic neck pain, pseudogout  PHYSICAL EXAM: BP 111/78  Pulse 80  Temp(Src) 98.1 F (36.7 C) (Oral)  Ht 5\' 4"  (1.626 m)  Wt 169 lb 11.2 oz (76.975 kg)  BMI 29.11 kg/m2 Gen: NAD HEENT: NCAT, full ROM of neck, posterior neck musculature TTP and in mild spasm Heart: RRR, no murmurs Lungs: CTAB, NWOB Neuro: grossly nonfocal, speech normal Ext: No appreciable lower extremity edema bilaterally   ASSESSMENT/PLAN:  Cervicalgia Worsened lately. Will slightly increase quantity of norco to 70 tablets per month. Advised we will need to back down on this next time. Pt agreeable to plan. F/u in 1 month.  Shortness of breath Etiology remains unclear, although reassuring that it is mildly improved. Echo was normal. Has not yet had PFT's. I suspect this is anxiety-related but want to do a little more investigating before we label it as a panic  attack. Plan -schedule visit for PFT's with Dr. Valentina Lucks -pt to try a one time dose of her home xanax when this happens to see if she gets relief. She is appropriately weary of regular benzo use, but we will just use this as a one-time trial. If SOB improves with xanax, will focus our treatment more on her anxiety. -if the above testing is unrevealing, will likely set up for a 24-48 hour holter monitor to evaluate for transient arrhythmia which could be contributing.   FOLLOW UP: F/u in a few weeks for SOB after gets PFT's with Dr. Valentina Lucks F/u for chronic neck pain in 1 month  [name], [place]

## 2014-04-22 ENCOUNTER — Ambulatory Visit: Payer: Self-pay | Admitting: Pharmacist

## 2014-04-27 ENCOUNTER — Ambulatory Visit: Payer: Self-pay | Admitting: Family Medicine

## 2014-05-05 ENCOUNTER — Ambulatory Visit: Payer: Self-pay | Admitting: Pharmacist

## 2014-05-10 ENCOUNTER — Ambulatory Visit: Payer: Self-pay | Admitting: Pharmacist

## 2014-05-13 ENCOUNTER — Encounter: Payer: Self-pay | Admitting: Family Medicine

## 2014-05-13 ENCOUNTER — Ambulatory Visit (INDEPENDENT_AMBULATORY_CARE_PROVIDER_SITE_OTHER): Payer: Self-pay | Admitting: Family Medicine

## 2014-05-13 VITALS — BP 105/73 | HR 80 | Temp 98.3°F | Ht 64.0 in | Wt 166.0 lb

## 2014-05-13 DIAGNOSIS — M542 Cervicalgia: Secondary | ICD-10-CM

## 2014-05-13 DIAGNOSIS — R0602 Shortness of breath: Secondary | ICD-10-CM

## 2014-05-13 DIAGNOSIS — M1120 Other chondrocalcinosis, unspecified site: Secondary | ICD-10-CM

## 2014-05-13 DIAGNOSIS — F319 Bipolar disorder, unspecified: Secondary | ICD-10-CM

## 2014-05-13 LAB — COMPREHENSIVE METABOLIC PANEL
ALK PHOS: 53 U/L (ref 39–117)
ALT: 14 U/L (ref 0–35)
AST: 17 U/L (ref 0–37)
Albumin: 4.3 g/dL (ref 3.5–5.2)
BILIRUBIN TOTAL: 0.3 mg/dL (ref 0.2–1.2)
BUN: 9 mg/dL (ref 6–23)
CO2: 25 mEq/L (ref 19–32)
Calcium: 9.1 mg/dL (ref 8.4–10.5)
Chloride: 106 mEq/L (ref 96–112)
Creat: 0.74 mg/dL (ref 0.50–1.10)
GLUCOSE: 81 mg/dL (ref 70–99)
Potassium: 4 mEq/L (ref 3.5–5.3)
Sodium: 140 mEq/L (ref 135–145)
Total Protein: 6.6 g/dL (ref 6.0–8.3)

## 2014-05-13 MED ORDER — LAMOTRIGINE 200 MG PO TABS: 200.0000 mg | ORAL_TABLET | Freq: Every day | ORAL | Status: DC

## 2014-05-13 MED ORDER — GABAPENTIN 300 MG PO CAPS: 300.0000 mg | ORAL_CAPSULE | Freq: Three times a day (TID) | ORAL | Status: DC

## 2014-05-13 MED ORDER — COLCHICINE 0.6 MG PO TABS: 0.6000 mg | ORAL_TABLET | Freq: Two times a day (BID) | ORAL | Status: DC

## 2014-05-13 MED ORDER — ALPRAZOLAM 2 MG PO TABS: ORAL_TABLET | ORAL | Status: DC

## 2014-05-13 MED ORDER — HYDROCODONE-ACETAMINOPHEN 10-325 MG PO TABS: 1.0000 | ORAL_TABLET | Freq: Three times a day (TID) | ORAL | Status: DC | PRN

## 2014-05-13 NOTE — Progress Notes (Signed)
Patient ID: Sandra Briggs, female   DOB: 1976/01/22, 38 y.o.   MRN: [id number]  HPI:  SOB: missed PFT appointments due to two acute illnesses, now is better. Breathing is doing better now. Still occasionally SOB. Zyrtec and inhaler have helped. Now able to ambulate without being significantly short of breath. Never took xanax as trial as discussed last time because hasn't had an attack. No lower extremity swelling.  Med refills: needs lamictal, neurontin, colchicine, hydrocodone, xanax refilled today. This is the best she's ever felt from a mood standpoint. Feels stable. No SI/HI. Sleeping well. Uses xanax very rarely. Has 6-7 pills left. Having it on hand is enough usually to calm her anxiety, without actually having to take it.   Neck pain: is a 4/10 when just sitting. Hurting worse now because her work season has picked up. Is Freight forwarder at [employer]. Takes Norco 2-3 per day. Willing to go back down to 60 pills per month. The pain medicine helps. Takes ibuprofen first thing in the morning 800mg .  ROS: See HPI.  [place]: hx bipolar d/o, panic d/o, chronic neck pain  PHYSICAL EXAM: BP 105/73  Pulse 80  Temp(Src) 98.3 F (36.8 C) (Oral)  Ht 5\' 4"  (1.626 m)  Wt 166 lb (75.297 kg)  BMI 28.48 kg/m2 Gen: NAD HEENT: NCAT, full ROM of neck Heart: RRR no murmurs Lungs: CTAB, NWOB Neuro: grossly nonfocal, speech normal Ext: No appreciable lower extremity edema bilaterally  Psych: normal range of affect, well groomed, speech normal in rate and volume, normal eye contact   ASSESSMENT/PLAN:  Cervicalgia Norco refilled today, with #60 pills per month. F/u in 1 mo for refill.  BIPOLAR DISORDER UNSPECIFIED Well controlled at present. Continue current regimen. Check CMET today to monitor LFT's while on lamictal.  Shortness of breath Still needs PFT's. Pt will schedule this with Dr. Valentina Lucks. Reassuring overall that her episodes of shorntess of breath have improved. Still may try for holter  monitor if PFT's are unrevealing.   FOLLOW UP: F/u in 1 month for neck pain. Schedule PFT appt with Dr. Valentina Lucks.  Sandra Briggs. Sandra Briggs, Sandra Briggs

## 2014-05-13 NOTE — Patient Instructions (Signed)
It was great to see you again today!  We refilled your medicines today. Follow up in 1 month for pain. Reschedule your appointment with Dr. Valentina Lucks for PFT's.  I will call you if your test results are not normal.  Otherwise, I will send you a letter.  If you do not hear from me with in 2 weeks please call our office.      Be well, Dr. [name]

## 2014-05-15 NOTE — Assessment & Plan Note (Signed)
Still needs PFT's. Pt will schedule this with Dr. Valentina Lucks. Reassuring overall that her episodes of shorntess of breath have improved. Still may try for holter monitor if PFT's are unrevealing.

## 2014-05-15 NOTE — Assessment & Plan Note (Signed)
Well controlled at present. Continue current regimen. Check CMET today to monitor LFT's while on lamictal.

## 2014-05-15 NOTE — Assessment & Plan Note (Signed)
Norco refilled today, with #60 pills per month. F/u in 1 mo for refill.

## 2014-06-10 ENCOUNTER — Telehealth: Payer: Self-pay | Admitting: Family Medicine

## 2014-06-10 NOTE — Telephone Encounter (Signed)
Patient has appointment scheduled for 11/9, will forward to PCP

## 2014-06-10 NOTE — Telephone Encounter (Signed)
Pt called and needs refills on her Zyrtec and Hydrocodone left up front for pick up. Please call when ready. jw

## 2014-06-15 NOTE — Telephone Encounter (Signed)
Pt asked about med refill status

## 2014-06-18 ENCOUNTER — Encounter (HOSPITAL_COMMUNITY): Payer: Self-pay | Admitting: Emergency Medicine

## 2014-06-18 ENCOUNTER — Emergency Department (HOSPITAL_COMMUNITY): Payer: Self-pay

## 2014-06-18 ENCOUNTER — Emergency Department (HOSPITAL_COMMUNITY)
Admission: EM | Admit: 2014-06-18 | Discharge: 2014-06-18 | Disposition: A | Discharge status: Home / Self Care | Payer: Self-pay | Attending: Emergency Medicine | Admitting: Emergency Medicine

## 2014-06-18 DIAGNOSIS — Y990 Civilian activity done for income or pay: Secondary | ICD-10-CM | POA: Insufficient documentation

## 2014-06-18 DIAGNOSIS — Z9851 Tubal ligation status: Secondary | ICD-10-CM | POA: Insufficient documentation

## 2014-06-18 DIAGNOSIS — Y9289 Other specified places as the place of occurrence of the external cause: Secondary | ICD-10-CM | POA: Insufficient documentation

## 2014-06-18 DIAGNOSIS — W010XXA Fall on same level from slipping, tripping and stumbling without subsequent striking against object, initial encounter: Secondary | ICD-10-CM | POA: Insufficient documentation

## 2014-06-18 DIAGNOSIS — G8929 Other chronic pain: Secondary | ICD-10-CM | POA: Insufficient documentation

## 2014-06-18 DIAGNOSIS — S9031XA Contusion of right foot, initial encounter: Secondary | ICD-10-CM | POA: Insufficient documentation

## 2014-06-18 DIAGNOSIS — J45990 Exercise induced bronchospasm: Secondary | ICD-10-CM | POA: Insufficient documentation

## 2014-06-18 DIAGNOSIS — F410 Panic disorder [episodic paroxysmal anxiety] without agoraphobia: Secondary | ICD-10-CM | POA: Insufficient documentation

## 2014-06-18 DIAGNOSIS — Z87891 Personal history of nicotine dependence: Secondary | ICD-10-CM | POA: Insufficient documentation

## 2014-06-18 DIAGNOSIS — Z87448 Personal history of other diseases of urinary system: Secondary | ICD-10-CM | POA: Insufficient documentation

## 2014-06-18 DIAGNOSIS — Z79899 Other long term (current) drug therapy: Secondary | ICD-10-CM | POA: Insufficient documentation

## 2014-06-18 DIAGNOSIS — S93401A Sprain of unspecified ligament of right ankle, initial encounter: Secondary | ICD-10-CM | POA: Insufficient documentation

## 2014-06-18 DIAGNOSIS — Y9389 Activity, other specified: Secondary | ICD-10-CM | POA: Insufficient documentation

## 2014-06-18 MED ORDER — HYDROCODONE-ACETAMINOPHEN 5-325 MG PO TABS: 1.0000 | ORAL_TABLET | ORAL | Status: DC | PRN

## 2014-06-18 MED ORDER — HYDROCODONE-ACETAMINOPHEN 5-325 MG PO TABS
2.0000 | ORAL_TABLET | Freq: Once | ORAL | Status: AC
Start: 2014-06-18 — End: 2014-06-18
  Administered 2014-06-18: 2 via ORAL
  Filled 2014-06-18: qty 2

## 2014-06-18 NOTE — Discharge Instructions (Signed)
Ankle Sprain °An ankle sprain is an injury to the strong, fibrous tissues (ligaments) that hold the bones of your ankle joint together.  °CAUSES °An ankle sprain is usually caused by a fall or by twisting your ankle. Ankle sprains most commonly occur when you step on the outer edge of your foot, and your ankle turns inward. People who participate in sports are more prone to these types of injuries.  °SYMPTOMS  °· Pain in your ankle. The pain may be present at rest or only when you are trying to stand or walk. °· Swelling. °· Bruising. Bruising may develop immediately or within 1 to 2 days after your injury. °· Difficulty standing or walking, particularly when turning corners or changing directions. °DIAGNOSIS  °Your caregiver will ask you details about your injury and perform a physical exam of your ankle to determine if you have an ankle sprain. During the physical exam, your caregiver will press on and apply pressure to specific areas of your foot and ankle. Your caregiver will try to move your ankle in certain ways. An X-ray exam may be done to be sure a bone was not broken or a ligament did not separate from one of the bones in your ankle (avulsion fracture).  °TREATMENT  °Certain types of braces can help stabilize your ankle. Your caregiver can make a recommendation for this. Your caregiver may recommend the use of medicine for pain. If your sprain is severe, your caregiver may refer you to a surgeon who helps to restore function to parts of your skeletal system (orthopedist) or a physical therapist. °HOME CARE INSTRUCTIONS  °· Apply ice to your injury for 1-2 days or as directed by your caregiver. Applying ice helps to reduce inflammation and pain. °¨ Put ice in a plastic bag. °¨ Place a towel between your skin and the bag. °¨ Leave the ice on for 15-20 minutes at a time, every 2 hours while you are awake. °· Only take over-the-counter or prescription medicines for pain, discomfort, or fever as directed by  your caregiver. °· Elevate your injured ankle above the level of your heart as much as possible for 2-3 days. °· If your caregiver recommends crutches, use them as instructed. Gradually put weight on the affected ankle. Continue to use crutches or a cane until you can walk without feeling pain in your ankle. °· If you have a plaster splint, wear the splint as directed by your caregiver. Do not rest it on anything harder than a pillow for the first 24 hours. Do not put weight on it. Do not get it wet. You may take it off to take a shower or bath. °· You may have been given an elastic bandage to wear around your ankle to provide support. If the elastic bandage is too tight (you have numbness or tingling in your foot or your foot becomes cold and blue), adjust the bandage to make it comfortable. °· If you have an air splint, you may blow more air into it or let air out to make it more comfortable. You may take your splint off at night and before taking a shower or bath. Wiggle your toes in the splint several times per day to decrease swelling. °SEEK MEDICAL CARE IF:  °· You have rapidly increasing bruising or swelling. °· Your toes feel extremely cold or you lose feeling in your foot. °· Your pain is not relieved with medicine. °SEEK IMMEDIATE MEDICAL CARE IF: °· Your toes are numb or blue. °·   You have severe pain that is increasing. MAKE SURE YOU:   Understand these instructions.  Will watch your condition.  Will get help right away if you are not doing well or get worse. Document Released: 08/05/2005 Document Revised: 04/29/2012 Document Reviewed: 08/17/2011 [facility] Patient Information 2015 [place], [place]. This information is not intended to replace advice given to you by your health care provider. Make sure you discuss any questions you have with your health care provider.     Foot Contusion A foot contusion is a deep bruise to the foot. Contusions are the result of an injury that caused bleeding  under the skin. The contusion may turn blue, purple, or yellow. Minor injuries will give you a painless contusion, but more severe contusions may stay painful and swollen for a few weeks. CAUSES  A foot contusion comes from a direct blow to that area, such as a heavy object falling on the foot. SYMPTOMS   Swelling of the foot.  Discoloration of the foot.  Tenderness or soreness of the foot. DIAGNOSIS  You will have a physical exam and will be asked about your history. You may need an X-ray of your foot to look for a broken bone (fracture).  TREATMENT  An elastic wrap may be recommended to support your foot. Resting, elevating, and applying cold compresses to your foot are often the best treatments for a foot contusion. Over-the-counter medicines may also be recommended for pain control. HOME CARE INSTRUCTIONS   Put ice on the injured area.  Put ice in a plastic bag.  Place a towel between your skin and the bag.  Leave the ice on for 15-20 minutes, 03-04 times a day.  Only take over-the-counter or prescription medicines for pain, discomfort, or fever as directed by your caregiver.  If told, use an elastic wrap as directed. This can help reduce swelling. You may remove the wrap for sleeping, showering, and bathing. If your toes become numb, cold, or blue, take the wrap off and reapply it more loosely.  Elevate your foot with pillows to reduce swelling.  Try to avoid standing or walking while the foot is painful. Do not resume use until instructed by your caregiver. Then, begin use gradually. If pain develops, decrease use. Gradually increase activities that do not cause discomfort until you have normal use of your foot.  See your caregiver as directed. It is very important to keep all follow-up appointments in order to avoid any lasting problems with your foot, including long-term (chronic) pain. SEEK IMMEDIATE MEDICAL CARE IF:   You have increased redness, swelling, or pain in your  foot.  Your swelling or pain is not relieved with medicines.  You have loss of feeling in your foot or are unable to move your toes.  Your foot turns cold or blue.  You have pain when you move your toes.  Your foot becomes warm to the touch.  Your contusion does not improve in 2 days. MAKE SURE YOU:   Understand these instructions.  Will watch your condition.  Will get help right away if you are not doing well or get worse. Document Released: 05/27/2006 Document Revised: 02/04/2012 Document Reviewed: 07/09/2011 [facility] Patient Information 2015 [place], [place]. This information is not intended to replace advice given to you by your health care provider. Make sure you discuss any questions you have with your health care provider.
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
## 2014-06-18 NOTE — ED Notes (Signed)
Pt verbalized understanding of discharge instructions.  Pt wheeled to the waiting room to wait for family by this RN.  Pt alert and oriented.
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
## 2014-06-18 NOTE — ED Notes (Signed)
Dr. Regenia Skeeter at bedside for the results.
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
## 2014-06-20 MED ORDER — CETIRIZINE HCL 10 MG PO TABS: 10.0000 mg | ORAL_TABLET | Freq: Every day | ORAL | Status: DC

## 2014-06-20 NOTE — Telephone Encounter (Signed)
From chart review, patient received rx for hydrocodone from ER this weekend. Will therefore not refill until she follows up for an appointment. Will send in rx for zyrtec. Leeanne Rio, MD

## 2014-06-20 NOTE — Telephone Encounter (Signed)
Pt informed. Says she only got 12 from ER. Let her know she had to come in for a follow up. Kennis Wissmann CMA

## 2014-06-27 ENCOUNTER — Encounter: Payer: Self-pay | Admitting: Family Medicine

## 2014-06-27 ENCOUNTER — Ambulatory Visit (INDEPENDENT_AMBULATORY_CARE_PROVIDER_SITE_OTHER): Payer: Self-pay | Admitting: Family Medicine

## 2014-06-27 VITALS — BP 115/78 | HR 91 | Temp 98.2°F | Ht 64.0 in | Wt 160.5 lb

## 2014-06-27 DIAGNOSIS — M542 Cervicalgia: Secondary | ICD-10-CM

## 2014-06-27 DIAGNOSIS — F410 Panic disorder [episodic paroxysmal anxiety] without agoraphobia: Secondary | ICD-10-CM

## 2014-06-27 DIAGNOSIS — M1120 Other chondrocalcinosis, unspecified site: Secondary | ICD-10-CM

## 2014-06-27 DIAGNOSIS — M1180 Other specified crystal arthropathies, unspecified site: Secondary | ICD-10-CM

## 2014-06-27 DIAGNOSIS — F1190 Opioid use, unspecified, uncomplicated: Secondary | ICD-10-CM

## 2014-06-27 DIAGNOSIS — R0602 Shortness of breath: Secondary | ICD-10-CM

## 2014-06-27 MED ORDER — HYDROCODONE-ACETAMINOPHEN 10-325 MG PO TABS: 1.0000 | ORAL_TABLET | Freq: Three times a day (TID) | ORAL | Status: DC | PRN

## 2014-06-27 MED ORDER — ALPRAZOLAM 2 MG PO TABS: ORAL_TABLET | ORAL | Status: DC

## 2014-06-27 MED ORDER — BECLOMETHASONE DIPROPIONATE 40 MCG/ACT IN AERS: 1.0000 | INHALATION_SPRAY | Freq: Two times a day (BID) | RESPIRATORY_TRACT | Status: DC

## 2014-06-27 NOTE — Patient Instructions (Signed)
It was great to see you again today!  Refilled pain medicine x 2 months. Gave prescription for xanax.  Start Qvar twice a day. Schedule an appointment with Dr. Valentina Lucks for pulmonary function tests.  Be well, Dr. [name]

## 2014-06-27 NOTE — Progress Notes (Signed)
Patient ID: Sandra Briggs, female   DOB: 08-Mar-1976, 38 y.o.   MRN: [id number]  HPI:  Ankle sprain - was seen in ED for R ankle sprain. Overall is getting better but is still sore.  Chronic pain - needs refill on norco. Takes 1 tab TID. Last dose was this morning. Able to give urine sample today for UDS. Medicine helps her be functional with pain relief.  Breathing - has been using albuterol every morning, also sometimes at night. Has not yet seen Dr. Valentina Lucks for PFT's. Tried taking xanax during an episode of SOB (as I instructed her to) and it did not help. Breathing overall doing better.  Anxiety - needs refill on xanax. Almost never ever takes it but likes having it on hand as this relives her anxiety more than anything else, just knowing she could take it if she needed to.   ROS: See HPI.  [place]: hx panic disorder, bipolar d/o  PHYSICAL EXAM: BP 115/78 mmHg  Pulse 91  Temp(Src) 98.2 F (36.8 C) (Oral)  Ht 5\' 4"  (1.626 m)  Wt 160 lb 8 oz (72.802 kg)  BMI 27.54 kg/m2 Gen: NAD HEENT: NCAT Heart: RRR no murmurs Lungs: CTAB NWOB Neuro: grossly nonfocal speech normal Ext: R ankle still with mild bruising and swelling. 2+ DP pulse, full strength with all ankle movements. Able to move toes and feel light touch.   ASSESSMENT/PLAN:  Ankle sprain  - improving, continue supportive care  Cervicalgia Refilled norco #60 pills x 2 months. UDS today. F/u in 2 mos for refill.  PANIC DISORDER Stable. Refill xanax today (having it on hand is really why she needs rx, does not actually take this).  Shortness of breath Using albuterol daily, sometimes BID. This is less than ideal. Will start qvar in the event she has some undiagnosed component of asthma. F/u in 2 mos to see if this has helped. aslo reminded pt again to schedule appt for PFT's with Dr. Valentina Lucks.   FOLLOW UP: F/u in 2 mos for pain and SOB Schedule PFT appt with Dr. Annalee Genta. [name], [place]

## 2014-06-28 LAB — DRUG SCREEN URINE W/ALC, NO CONF
Amphetamine Screen, Ur: NEGATIVE
BARBITURATE QUANT UR: NEGATIVE
Benzodiazepines.: NEGATIVE
Cocaine Metabolites: NEGATIVE
Creatinine,U: 57.3 mg/dL
MARIJUANA METABOLITE: NEGATIVE
METHADONE: NEGATIVE
Opiate Screen, Urine: POSITIVE — AB
PROPOXYPHENE: NEGATIVE
Phencyclidine (PCP): NEGATIVE

## 2014-06-30 NOTE — Assessment & Plan Note (Signed)
Refilled norco #60 pills x 2 months. UDS today. F/u in 2 mos for refill.

## 2014-06-30 NOTE — Assessment & Plan Note (Addendum)
Using albuterol daily, sometimes BID. This is less than ideal. Will start qvar in the event she has some undiagnosed component of asthma. F/u in 2 mos to see if this has helped. aslo reminded pt again to schedule appt for PFT's with Dr. Valentina Lucks.

## 2014-06-30 NOTE — Assessment & Plan Note (Signed)
Stable. Refill xanax today (having it on hand is really why she needs rx, does not actually take this).

## 2014-07-27 ENCOUNTER — Telehealth: Payer: Self-pay | Admitting: *Deleted

## 2014-07-27 NOTE — Telephone Encounter (Signed)
Received call from [place], [place] to verify and authorized Rx for [employer]  Rx verified and authorized  One time only by very order Dr. [name].  Derl Barrow, RN

## 2014-07-27 NOTE — Telephone Encounter (Signed)
Pt called stating she is on vacation in [place]. She has her written Rx for Norco and wanted to get it filled there.  Pt stated that a Pharmacist should be calling the clinic to get an approval since she is out of state.  Pt told that a pharmacy has not called at this time, will send a message to her PCP.  Derl Barrow, RN

## 2014-07-27 NOTE — Telephone Encounter (Signed)
This is okay to authorize if the pharmacy calls for a one-time out of state refill.  Leeanne Rio, MD

## 2014-08-29 ENCOUNTER — Ambulatory Visit (INDEPENDENT_AMBULATORY_CARE_PROVIDER_SITE_OTHER): Payer: Self-pay | Admitting: Family Medicine

## 2014-08-29 ENCOUNTER — Ambulatory Visit (INDEPENDENT_AMBULATORY_CARE_PROVIDER_SITE_OTHER): Payer: Self-pay | Admitting: *Deleted

## 2014-08-29 ENCOUNTER — Encounter: Payer: Self-pay | Admitting: Family Medicine

## 2014-08-29 VITALS — BP 137/83 | HR 90 | Temp 98.5°F | Ht 64.0 in | Wt 160.0 lb

## 2014-08-29 DIAGNOSIS — F410 Panic disorder [episodic paroxysmal anxiety] without agoraphobia: Secondary | ICD-10-CM

## 2014-08-29 DIAGNOSIS — M542 Cervicalgia: Secondary | ICD-10-CM

## 2014-08-29 DIAGNOSIS — Z23 Encounter for immunization: Secondary | ICD-10-CM

## 2014-08-29 DIAGNOSIS — M1120 Other chondrocalcinosis, unspecified site: Secondary | ICD-10-CM

## 2014-08-29 DIAGNOSIS — M1180 Other specified crystal arthropathies, unspecified site: Secondary | ICD-10-CM

## 2014-08-29 DIAGNOSIS — Z7189 Other specified counseling: Secondary | ICD-10-CM

## 2014-08-29 DIAGNOSIS — G8929 Other chronic pain: Secondary | ICD-10-CM

## 2014-08-29 MED ORDER — HYDROCODONE-ACETAMINOPHEN 10-325 MG PO TABS: 1.0000 | ORAL_TABLET | Freq: Three times a day (TID) | ORAL | Status: DC | PRN

## 2014-08-29 MED ORDER — ALPRAZOLAM 2 MG PO TABS: ORAL_TABLET | ORAL | Status: DC

## 2014-08-29 NOTE — Progress Notes (Signed)
Patient ID: Sandra Briggs, female   DOB: [birth year], 39 y.o.   MRN: [id number]  HPI:  Pt presents today for refills of her meds.  Anxiety: Recently had to take Xanax more frequently than normal because she was on vacation. Had more anxiety with driving, because she did not know where local hospital was. Doing better now that she is at home. Denies suicidal or homicidal ideation.  Chronic pain: Currently taking Norco 10-3 25 mg every 8 hours as needed. Get 60 pills per month. She takes 1-3 of these per day depending on how she feels. Pain is primarily in the back of her neck. She did have a new type of pain on Friday that lasted about 3-4 hours. It was a buzzing feeling. She's not had this new pain since.  Breathing issues: Breathing continues to feel better. No concerns with this right now  ROS: See HPI.  [place]: History of allergic rhinitis, pseudogout of neck, bipolar disorder, panic disorder, chronic neck pain  PHYSICAL EXAM: BP 137/83 mmHg  Pulse 90  Temp(Src) 98.5 F (36.9 C) (Oral)  Ht 5\' 4"  (1.626 m)  Wt 160 lb (72.576 kg)  BMI 27.45 kg/m2 Gen: NAD HEENT: NCAT, full ROM of neck without meningeal signs Heart: RRR Lungs: CTAB Neuro: grossly nonfocal speech normal Ext: atraumatic, full strength bilat lower ext  ASSESSMENT/PLAN:  Health maintenance:  -flu shot given today   Cervicalgia Stable. Refilled norco x 3 months. Noted after visit that no controlled substance agreement located in chart. Will need to do this at next OV in 3 months.   PANIC DISORDER Stable. Refilled xanax. F/u in 3 mos    FOLLOW UP: F/u in 3 months for chronic pain (needs to sign controlled substance agreement at that visit).  Ruth. [name], [place]

## 2014-08-29 NOTE — Patient Instructions (Signed)
It was great to see you again today!  We refilled your pain medicine for 3 months Also refilled xanax I will see you back in 34months or sooner if you have any problems  Be well, Dr. [name]

## 2014-09-04 DIAGNOSIS — G8929 Other chronic pain: Secondary | ICD-10-CM | POA: Insufficient documentation

## 2014-09-04 NOTE — Assessment & Plan Note (Signed)
Stable. Refilled xanax. F/u in 3 mos

## 2014-09-04 NOTE — Assessment & Plan Note (Signed)
Stable. Refilled norco x 3 months. Noted after visit that no controlled substance agreement located in chart. Will need to do this at next OV in 3 months.

## 2014-10-21 ENCOUNTER — Encounter: Payer: Self-pay | Admitting: Family Medicine

## 2014-10-21 ENCOUNTER — Ambulatory Visit (INDEPENDENT_AMBULATORY_CARE_PROVIDER_SITE_OTHER): Payer: Self-pay | Admitting: Family Medicine

## 2014-10-21 VITALS — BP 137/70 | HR 106 | Temp 98.0°F | Ht 64.0 in | Wt 163.8 lb

## 2014-10-21 DIAGNOSIS — J189 Pneumonia, unspecified organism: Secondary | ICD-10-CM

## 2014-10-21 MED ORDER — AZITHROMYCIN 250 MG PO TABS: ORAL_TABLET | ORAL | Status: DC

## 2014-10-21 NOTE — Assessment & Plan Note (Signed)
Patient with coughing paroxysms, with chest pain and frontal sinus pain with coughing.  No fevers or chills, no rales on exam.   Empiric treatment with azithro, follow up if not improving, would consider CXR if not improving or if worsening.

## 2014-10-21 NOTE — Progress Notes (Signed)
   Subjective:    Patient ID: Sandra Briggs, female    DOB: [birth year], 39 y.o.   MRN: [id number]  HPI Patient seen today for SDA, she complains of severe coughing paroxysms that are worse in the evenings and prevent her from sleeping. Has trouble bringing up phlegm with the cough. OTC Mucinex not helping with the cough. Frontal sinus pain and chest wall pain when she coughs forcefully, feels like she could vomit from the forcefulness of the cough.  May have had low-grade fever when illness began about 2 weeks ago. Sons ages 65-12 were sick with similar sxs about 2 weeks ago and resolved quickly. She has gotten somewhat worse in that time frame.   She is a former smoker, quit in November 27, 2006 when her mother died of lung cancer.   Denies chest pain other than with the cough. No shortness of breath other than with the cough.  No bloody sputum with cough.  No recent abx use. She uses albuterol for seasonal allergies, not using much yet this season.   Received flu vaccine this season.  Review of Systems     Objective:   Physical Exam Mildly ill appearing, no apparent distress HEENT Neck supple. Trace shotty anterior cervical adenopathy noted. Clear oropharynx. TMs clear bilaterally. Trace frontal sinus tenderness. Injected conjunctivae. No frontal tenderness. Nasal mucosa injected, no purulence noted.  COR Regular S1S2, no extra sounds PULM Clear bilaterally, no rales or wheezes. Coughing paroxysms started with deep breathing.        Assessment & Plan:

## 2014-10-21 NOTE — Patient Instructions (Signed)
It was a pleasure to see you today.  I believe your cough is from either a persistent bronchitis or a 'walking pneumonia'; either way, I would like to treat with you the antibiotic Azithromycin 250mg . Take 2 tablets by mouth on the first day, then 1 tablet/day for 4 more days.  You should begin to feel better by the end of the treatment. If not, I ask that you please come back to be seen.   Mucinex (guiafenesin 600mg  tablets), take 2 tablets (1200mg  of guiafenesin) by mouth every 12 hours to loosen phlegm.

## 2014-10-23 ENCOUNTER — Emergency Department (HOSPITAL_COMMUNITY)
Admission: EM | Admit: 2014-10-23 | Discharge: 2014-10-23 | Disposition: A | Discharge status: Home / Self Care | Payer: Self-pay | Attending: Emergency Medicine | Admitting: Emergency Medicine

## 2014-10-23 ENCOUNTER — Encounter (HOSPITAL_COMMUNITY): Payer: Self-pay | Admitting: *Deleted

## 2014-10-23 ENCOUNTER — Emergency Department (HOSPITAL_COMMUNITY): Payer: Self-pay

## 2014-10-23 DIAGNOSIS — Z792 Long term (current) use of antibiotics: Secondary | ICD-10-CM | POA: Insufficient documentation

## 2014-10-23 DIAGNOSIS — Z87828 Personal history of other (healed) physical injury and trauma: Secondary | ICD-10-CM | POA: Insufficient documentation

## 2014-10-23 DIAGNOSIS — Z7951 Long term (current) use of inhaled steroids: Secondary | ICD-10-CM | POA: Insufficient documentation

## 2014-10-23 DIAGNOSIS — J111 Influenza due to unidentified influenza virus with other respiratory manifestations: Secondary | ICD-10-CM | POA: Insufficient documentation

## 2014-10-23 DIAGNOSIS — H9201 Otalgia, right ear: Secondary | ICD-10-CM | POA: Insufficient documentation

## 2014-10-23 DIAGNOSIS — F419 Anxiety disorder, unspecified: Secondary | ICD-10-CM | POA: Insufficient documentation

## 2014-10-23 DIAGNOSIS — Z87891 Personal history of nicotine dependence: Secondary | ICD-10-CM | POA: Insufficient documentation

## 2014-10-23 DIAGNOSIS — J45909 Unspecified asthma, uncomplicated: Secondary | ICD-10-CM | POA: Insufficient documentation

## 2014-10-23 DIAGNOSIS — Z79899 Other long term (current) drug therapy: Secondary | ICD-10-CM | POA: Insufficient documentation

## 2014-10-23 DIAGNOSIS — G8929 Other chronic pain: Secondary | ICD-10-CM | POA: Insufficient documentation

## 2014-10-23 LAB — RAPID STREP SCREEN (MED CTR MEBANE ONLY): Streptococcus, Group A Screen (Direct): NEGATIVE

## 2014-10-23 MED ORDER — DEXAMETHASONE SODIUM PHOSPHATE 10 MG/ML IJ SOLN
10.0000 mg | Freq: Once | INTRAMUSCULAR | Status: AC
  Administered 2014-10-23: 10 mg via INTRAMUSCULAR
  Filled 2014-10-23: qty 1

## 2014-10-23 MED ORDER — HYDROCOD POLST-CHLORPHEN POLST 10-8 MG/5ML PO LQCR: 5.0000 mL | Freq: Two times a day (BID) | ORAL | Status: DC | PRN

## 2014-10-23 MED ORDER — ACETAMINOPHEN 325 MG PO TABS
650.0000 mg | ORAL_TABLET | Freq: Once | ORAL | Status: AC
  Administered 2014-10-23: 650 mg via ORAL
  Filled 2014-10-23: qty 2

## 2014-10-23 NOTE — Discharge Instructions (Signed)
Follow-up with your primary care doctor. Return to the ER if any severe shortness of breath, chest pain, high fever greater than 100.64F worsening of symptoms.   Influenza Influenza ("the flu") is a viral infection of the respiratory tract. It occurs more often in winter months because people spend more time in close contact with one another. Influenza can make you feel very sick. Influenza easily spreads from person to person (contagious). CAUSES  Influenza is caused by a virus that infects the respiratory tract. You can catch the virus by breathing in droplets from an infected person's cough or sneeze. You can also catch the virus by touching something that was recently contaminated with the virus and then touching your mouth, nose, or eyes. RISKS AND COMPLICATIONS You may be at risk for a more severe case of influenza if you smoke cigarettes, have diabetes, have chronic heart disease (such as heart failure) or lung disease (such as asthma), or if you have a weakened immune system. Elderly people and pregnant women are also at risk for more serious infections. The most common problem of influenza is a lung infection (pneumonia). Sometimes, this problem can require emergency medical care and may be life threatening. SIGNS AND SYMPTOMS  Symptoms typically last 4 to 10 days and may include:  Fever.  Chills.  Headache, body aches, and muscle aches.  Sore throat.  Chest discomfort and cough.  Poor appetite.  Weakness or feeling tired.  Dizziness.  Nausea or vomiting. DIAGNOSIS  Diagnosis of influenza is often made based on your history and a physical exam. A nose or throat swab test can be done to confirm the diagnosis. TREATMENT  In mild cases, influenza goes away on its own. Treatment is directed at relieving symptoms. For more severe cases, your health care provider may prescribe antiviral medicines to shorten the sickness. Antibiotic medicines are not effective because the infection  is caused by a virus, not by bacteria. HOME CARE INSTRUCTIONS  Take medicines only as directed by your health care provider.  Use a cool mist humidifier to make breathing easier.  Get plenty of rest until your temperature returns to normal. This usually takes 3 to 4 days.  Drink enough fluid to keep your urine clear or pale yellow.  Cover yourmouth and nosewhen coughing or sneezing,and wash your handswellto prevent thevirusfrom spreading.  Stay homefromwork orschool untilthe fever is gonefor at least 48full day. PREVENTION  An annual influenza vaccination (flu shot) is the best way to avoid getting influenza. An annual flu shot is now routinely recommended for all adults in the [place] IF:  You experiencechest pain, yourcough worsens,or you producemore mucus.  Youhave nausea,vomiting, ordiarrhea.  Your fever returns or gets worse. SEEK IMMEDIATE MEDICAL CARE IF:  You havetrouble breathing, you become short of breath,or your skin ornails becomebluish.  You have severe painor stiffnessin the neck.  You develop a sudden headache, or pain in the face or ear.  You have nausea or vomiting that you cannot control. MAKE SURE YOU:   Understand these instructions.  Will watch your condition.  Will get help right away if you are not doing well or get worse. Document Released: 08/02/2000 Document Revised: 12/20/2013 Document Reviewed: 11/04/2011 [facility] Patient Information 2015 [place], [place]. This information is not intended to replace advice given to you by your health care provider. Make sure you discuss any questions you have with your health care provider.

## 2014-10-23 NOTE — ED Notes (Signed)
Strep negative.

## 2014-10-23 NOTE — ED Provider Notes (Signed)
CSN: [id number]     Arrival date & time 10/23/14  1740 History   First MD Initiated Contact with Patient 10/23/14 1820     Chief Complaint  Patient presents with  . Generalized Body Aches     (Consider location/radiation/quality/duration/timing/severity/associated sxs/prior Treatment) HPI Sandra Briggs is a 39 year old female with no serious past medical history who presents the ER complaining of fever, cough, nasal congestion, otalgia. Patient reports having 2 weeks of a nonproductive cough for chest she was seen at an urgent care on Friday. Patient was given a prescription for azithromycin, and patient states she had a gradual onset later on that day of nasal congestion, sore throat, cough and fever of up to 101.1F. Patient states her symptoms have persisted throughout the weekend, over the past 48 hours. Patient states she is concerned because she was running a fever. Patient reports mild headache which is only present while coughing, patient denies associated chest pain, shortness of breath, abdominal pain, nausea, vomiting, blurred vision, dizziness, weakness.  Past Medical History  Diagnosis Date  . Exercise-induced asthma   . Panic disorder   . Dysuria-frequency syndrome   . Chronic pain     flank  . Sprain of MCL (medial collateral ligament) of knee   . Anxiety    Past Surgical History  Procedure Laterality Date  . Tubal ligation  06/28/2005  . Endometrial ablation  2009  . Cesarean section    . Appendectomy     Family History  Problem Relation Age of Onset  . Lung cancer Mother   . Fibroids Sister   . Hyperlipidemia Maternal Grandmother    History  Substance Use Topics  . Smoking status: Former Smoker    Types: Cigarettes    Quit date: 02/27/2011  . Smokeless tobacco: Former Systems developer    Quit date: 10/22/2010  . Alcohol Use: 0.0 oz/week     Comment: rare   OB History    No data available     Review of Systems  Constitutional: Negative for fever.  HENT: Positive  for congestion, ear pain, sinus pressure, sneezing and sore throat. Negative for trouble swallowing.   Eyes: Negative for visual disturbance.  Respiratory: Positive for cough. Negative for shortness of breath.   Cardiovascular: Negative for chest pain.  Gastrointestinal: Negative for nausea, vomiting and abdominal pain.  Genitourinary: Negative for dysuria.  Musculoskeletal: Negative for neck pain.  Skin: Negative for rash.  Neurological: Negative for dizziness, weakness and numbness.  Psychiatric/Behavioral: Negative.       Allergies  Bee venom; Sulfonamide derivatives; and Tramadol hcl  Home Medications   Prior to Admission medications   Medication Sig Start Date End Date Taking? Authorizing Provider  albuterol (PROVENTIL HFA;VENTOLIN HFA) 108 (90 BASE) MCG/ACT inhaler Inhale 2 puffs into the lungs every 6 (six) hours as needed for wheezing or shortness of breath. 03/03/14   Leeanne Rio, MD  alprazolam Duanne Moron) 2 MG tablet Take 1/4 tablet every 6 hours as needed for anxiety. 08/29/14   Leeanne Rio, MD  azithromycin (ZITHROMAX) 250 MG tablet Take 2 tablets by mouth on Day One, then 1 tablet daily for 4 more days. 10/21/14   Willeen Niece, MD  beclomethasone (QVAR) 40 MCG/ACT inhaler Inhale 1 puff into the lungs 2 (two) times daily. 06/27/14   Leeanne Rio, MD  cetirizine (ZYRTEC) 10 MG tablet Take 1 tablet (10 mg total) by mouth daily. 06/20/14   Leeanne Rio, MD  chlorpheniramine-HYDROcodone [facility]) 10-8 MG/5ML  LQCR Take 5 mLs by mouth every 12 (twelve) hours as needed for cough. 10/23/14   Carrie Mew, PA-C  colchicine 0.6 MG tablet Take 1 tablet (0.6 mg total) by mouth 2 (two) times daily. 05/13/14   Leeanne Rio, MD  cyclobenzaprine (FLEXERIL) 5 MG tablet Take 1 tablet (5 mg total) by mouth 3 (three) times daily as needed for muscle spasms. 03/10/13   Melony Overly, MD  gabapentin (NEURONTIN) 300 MG capsule Take 1 capsule (300 mg total)  by mouth 3 (three) times daily. 05/13/14   Leeanne Rio, MD  HYDROcodone-acetaminophen (NORCO) 10-325 MG per tablet Take 1 tablet by mouth every 8 (eight) hours as needed. 08/29/14   Leeanne Rio, MD  ibuprofen (ADVIL,MOTRIN) 800 MG tablet Take 800 mg by mouth every 8 (eight) hours as needed for pain.    Historical Provider, MD  lamoTRIgine (LAMICTAL) 200 MG tablet Take 1 tablet (200 mg total) by mouth daily. Taking for bi-polar disorder 05/13/14   Leeanne Rio, MD   BP 116/70 mmHg  Pulse 80  Temp(Src) 98.5 F (36.9 C) (Oral)  Resp 14  SpO2 98% Physical Exam  Constitutional: She is oriented to person, place, and time. She appears well-developed and well-nourished. No distress.  HENT:  Head: Normocephalic and atraumatic.  Right Ear: Ear canal normal. No drainage, swelling or tenderness. A middle ear effusion is present. No decreased hearing is noted.  Left Ear: Tympanic membrane and ear canal normal. No drainage, swelling or tenderness.  No middle ear effusion. No decreased hearing is noted.  Nose: Nose normal.  Mouth/Throat: Uvula is midline and mucous membranes are normal. No trismus in the jaw. Posterior oropharyngeal erythema present. No oropharyngeal exudate, posterior oropharyngeal edema or tonsillar abscesses.  Eyes: Right eye exhibits no discharge. Left eye exhibits no discharge. No scleral icterus.  Neck: Normal range of motion and full passive range of motion without pain. Neck supple. No spinous process tenderness and no muscular tenderness present. No rigidity. Normal range of motion present.  Cardiovascular: Normal rate, regular rhythm, S1 normal, S2 normal and normal heart sounds.   No murmur heard. Pulmonary/Chest: Effort normal and breath sounds normal. No accessory muscle usage. No tachypnea. No respiratory distress.  Abdominal: Soft. Normal appearance and bowel sounds are normal. There is no tenderness.  Musculoskeletal: Normal range of motion. She exhibits  no edema or tenderness.  Neurological: She is alert and oriented to person, place, and time. She has normal strength. No cranial nerve deficit or sensory deficit. Coordination normal. GCS eye subscore is 4. GCS verbal subscore is 5. GCS motor subscore is 6.  Patient fully alert, answering questions appropriately in full, clear sentences. Cranial nerves II through XII grossly intact. Motor strength 5 out of 5 in all major muscle groups of upper and lower extremity. Distal sensation intact.  Skin: Skin is warm and dry. No rash noted. She is not diaphoretic.  Psychiatric: She has a normal mood and affect.  Nursing note and vitals reviewed.   ED Course  Procedures (including critical care time) Labs Review Labs Reviewed  RAPID STREP SCREEN  CULTURE, GROUP A STREP    Imaging Review Dg Chest 2 View  10/23/2014   CLINICAL DATA:  Cough for 2 weeks.  Fever for 2 days.  EXAM: CHEST  2 VIEW  COMPARISON:  02/24/2014  FINDINGS: The heart size and mediastinal contours are within normal limits. Both lungs are clear. The visualized skeletal structures are unremarkable.  IMPRESSION: No  active cardiopulmonary disease.   Electronically Signed   By: Lucienne Capers M.D.   On: 10/23/2014 18:41     EKG Interpretation None      MDM   Final diagnoses:  Influenza    Patient with symptoms consistent with influenza.  Vitals are stable, low-grade fever.  No signs of dehydration, tolerating PO's.  Lungs are clear. Chest x-ray unremarkable for acute pathology.  Discussed the cost versus benefit of Tamiflu treatment with the patient.  The patient understands that symptoms are greater than the recommended 24-48 hour window of treatment.  Patient will be discharged with instructions to orally hydrate, rest, and use over-the-counter medications such as anti-inflammatories ibuprofen and Aleve for muscle aches and Tylenol for fever.  Patient will also be given a cough suppressant. I encouraged patient to follow up with  her primary care doctor, discussed return precautions with patient, and patient verbalizes understanding and agreement with this plan. I encouraged patient to call or return to ER should she have any questions or concerns.  BP 116/70 mmHg  Pulse 80  Temp(Src) 98.5 F (36.9 C) (Oral)  Resp 14  SpO2 98%  Signed,  Dahlia Bailiff, PA-C 11:05 PM  Patient discussed with Dr. Quintella Reichert, MD     Carrie Mew, PA-C 10/23/14 [place], MD 10/24/14 [phone]

## 2014-10-23 NOTE — ED Notes (Signed)
The pt has been ill for one week with a temp today bodyaches sinus infection and drfainage.  coild cough non-productive.   Pain in chest with coughing.   ibu 800mg   1200.  lmp none

## 2014-10-23 NOTE — ED Notes (Signed)
Headache earache both

## 2014-10-23 NOTE — ED Notes (Signed)
Pt transported to radiology.

## 2014-10-26 ENCOUNTER — Ambulatory Visit: Payer: Self-pay | Admitting: Family Medicine

## 2014-10-26 LAB — CULTURE, GROUP A STREP: Strep A Culture: NEGATIVE

## 2014-11-17 ENCOUNTER — Ambulatory Visit (INDEPENDENT_AMBULATORY_CARE_PROVIDER_SITE_OTHER): Payer: Self-pay | Admitting: Family Medicine

## 2014-11-17 ENCOUNTER — Encounter: Payer: Self-pay | Admitting: Family Medicine

## 2014-11-17 VITALS — BP 135/104 | HR 114 | Temp 99.3°F | Ht 64.0 in | Wt 165.0 lb

## 2014-11-17 DIAGNOSIS — Z7189 Other specified counseling: Secondary | ICD-10-CM

## 2014-11-17 DIAGNOSIS — M1120 Other chondrocalcinosis, unspecified site: Secondary | ICD-10-CM

## 2014-11-17 DIAGNOSIS — J019 Acute sinusitis, unspecified: Secondary | ICD-10-CM | POA: Insufficient documentation

## 2014-11-17 DIAGNOSIS — M1180 Other specified crystal arthropathies, unspecified site: Secondary | ICD-10-CM

## 2014-11-17 DIAGNOSIS — G8929 Other chronic pain: Secondary | ICD-10-CM

## 2014-11-17 DIAGNOSIS — R350 Frequency of micturition: Secondary | ICD-10-CM

## 2014-11-17 DIAGNOSIS — J0100 Acute maxillary sinusitis, unspecified: Secondary | ICD-10-CM

## 2014-11-17 LAB — POCT URINALYSIS DIPSTICK
Bilirubin, UA: NEGATIVE
Blood, UA: NEGATIVE
Glucose, UA: NEGATIVE
KETONES UA: NEGATIVE
LEUKOCYTES UA: NEGATIVE
NITRITE UA: NEGATIVE
PROTEIN UA: NEGATIVE
Spec Grav, UA: 1.01
UROBILINOGEN UA: 0.2
pH, UA: 7

## 2014-11-17 MED ORDER — AMOXICILLIN-POT CLAVULANATE 875-125 MG PO TABS: 1.0000 | ORAL_TABLET | Freq: Two times a day (BID) | ORAL | Status: DC

## 2014-11-17 MED ORDER — HYDROCODONE-ACETAMINOPHEN 10-325 MG PO TABS: 1.0000 | ORAL_TABLET | Freq: Three times a day (TID) | ORAL | Status: DC | PRN

## 2014-11-17 MED ORDER — FLUCONAZOLE 150 MG PO TABS: 150.0000 mg | ORAL_TABLET | Freq: Once | ORAL | Status: DC

## 2014-11-17 NOTE — Assessment & Plan Note (Addendum)
Duration of symptoms as well as recent flulike illness support diagnosis of acute bacterial sinusitis in this patient with green and brown nasal congestion. I will treat her with Augmentin twice daily for 10 days. Suspect this will make her feel better. She's been instructed to follow-up sooner than one month if she does not feel better, otherwise I will see her in one month. Given Diflucan in the event she develops yeast vaginitis.

## 2014-11-17 NOTE — Assessment & Plan Note (Signed)
Increased pain due to frequent cough recently. Treating underlying condition with Augmentin. Patient clearly states that this feels like an exacerbation of her chronic pain, as opposed to something new going on in her neck. I will refill her pain medication for one month. She'll follow-up in one month to ensure that her pain is improving. She's been instructed to follow-up sooner if she does not get better.

## 2014-11-17 NOTE — Progress Notes (Signed)
Patient ID: Sandra Briggs, female   DOB: 09-09-1975, 39 y.o.   MRN: [id number]  HPI:  Patient presents for follow-up of her chronic pain. She also notes that she's been feeling sick the last month. She was treated with azithromycin for atypical pneumonia. She also then went to the emergency room and was diagnosed with likely flu. Did not receive treatment with Tamiflu. She initially got better, but now has been having thick nasal congestion that is brown green in color. She feels sensation of something dripping down the back of her throat, causing frequent cough. The cough is making her chronic neck pain significantly worse. This is the first time she will run out of her pain medication early. She was reportedly told by the emergency room provider that she could take her pain medicine to 3 times per day. Normally she takes 2 pills per day. Afebrile here, but states she had a fever of 100.8 earlier today.  She also notes that she's been having malodorous and frequent urination. No dysuria. History of uterine ablation.  ROS: See HPI.  [place]: Chronic neck pain, allergic rhinitis, pseudogout, panic disorder, bipolar disorder  PHYSICAL EXAM: BP 135/104 mmHg  Pulse 114  Temp(Src) 99.3 F (37.4 C) (Oral)  Ht 5\' 4"  (1.626 m)  Wt 165 lb (74.844 kg)  BMI 28.31 kg/m2 Gen: No acute distress, pleasant, cooperative HEENT: Normocephalic, atraumatic, nares with congestion. TMs clear bilaterally. Oropharynx clear and moist without exudates. No anterior cervical lymphadenopathy. Slightly decreased range of motion of neck with the exception of leftward rotation of neck, which patient states she cannot do due to pain. No meningismus. Right maxillary and frontal sinuses mildly tender to palpation Heart: Regular rate and rhythm, no murmurs Lungs: Clear to auscultation bilaterally, normal respiratory effort Neuro: Grossly nonfocal, speech normal, full strength in bilateral arms Ext: Atraumatic, no  swelling  ASSESSMENT/PLAN:  Encounter for chronic pain management Increased pain due to frequent cough recently. Treating underlying condition with Augmentin. Patient clearly states that this feels like an exacerbation of her chronic pain, as opposed to something new going on in her neck. I will refill her pain medication for one month. She'll follow-up in one month to ensure that her pain is improving. She's been instructed to follow-up sooner if she does not get better.   Sinusitis, acute Duration of symptoms as well as recent flulike illness support diagnosis of acute bacterial sinusitis in this patient with green and brown nasal congestion. I will treat her with Augmentin twice daily for 10 days. Suspect this will make her feel better. She's been instructed to follow-up sooner than one month if she does not feel better, otherwise I will see her in one month. Given Diflucan in the event she develops yeast vaginitis.    Urinary frequency: This is mentioned by patient separately at the end of visit. Patient had urinalysis performed today. This was unremarkable. No signs of urinary tract infection. Continue to monitor.  FOLLOW UP: F/u in one month for chronic pain (needs pain contract signed at that visit)  [name], [place]

## 2014-11-17 NOTE — Patient Instructions (Signed)
Take augmentin twice a day for 10 days Refilled pain meds x 1 month Return if getting worse Otherwise follow up in 1 month Also sent in diflucan in case you need it for a yeast infection Call with questions.  Be well, Dr. [name]

## 2014-12-08 ENCOUNTER — Encounter: Payer: Self-pay | Admitting: Family Medicine

## 2014-12-08 ENCOUNTER — Ambulatory Visit (INDEPENDENT_AMBULATORY_CARE_PROVIDER_SITE_OTHER): Payer: Self-pay | Admitting: Family Medicine

## 2014-12-08 VITALS — BP 129/85 | HR 89 | Temp 98.4°F | Ht 64.0 in | Wt 165.1 lb

## 2014-12-08 DIAGNOSIS — M1120 Other chondrocalcinosis, unspecified site: Secondary | ICD-10-CM

## 2014-12-08 DIAGNOSIS — M1180 Other specified crystal arthropathies, unspecified site: Secondary | ICD-10-CM

## 2014-12-08 DIAGNOSIS — G8929 Other chronic pain: Secondary | ICD-10-CM

## 2014-12-08 DIAGNOSIS — Z7189 Other specified counseling: Secondary | ICD-10-CM

## 2014-12-08 DIAGNOSIS — M542 Cervicalgia: Secondary | ICD-10-CM

## 2014-12-08 MED ORDER — PREDNISONE 50 MG PO TABS: 50.0000 mg | ORAL_TABLET | Freq: Every day | ORAL | Status: DC

## 2014-12-08 MED ORDER — HYDROCODONE-ACETAMINOPHEN 10-325 MG PO TABS: 1.0000 | ORAL_TABLET | Freq: Three times a day (TID) | ORAL | Status: DC | PRN

## 2014-12-08 NOTE — Patient Instructions (Signed)
I'm referring you to a spine surgeon. Someone will call you with that appointment They will also call with MRI appointment Prednisone 50mg  daily for 7 days Refilled pain medicine See me in 1 month or sooner if you are getting worse.  Be well, Dr. [name]

## 2014-12-11 NOTE — Assessment & Plan Note (Addendum)
With her gradually worsening pain and now appreciable weakness in L grip, she warrants further evaluation. No signs or sx's concerning for spinal cord compression or cauda equina syndrome. Plan -refill morphine today (slightly incr # tabs due to incr pain) -treat acute flare with prednisone 50mg  daily x 7 days -MRI cervical spine without contrast -refer to spinal surgery -f/u with me in 1 mo but discussed reasons she should f/u sooner than this (if weakness/pain worsen)

## 2014-12-11 NOTE — Progress Notes (Signed)
Patient ID: Sandra Briggs, female   DOB: 07-Mar-1976, 39 y.o.   MRN: [id number]  HPI:  Neck pain: has had significantly worsening pain over last month. Having worsening radicular discomfort down left arm. Now feels numb most of the time and thinks she's starting to feel weak. Cannot flex her neck or the pain is snigificantly worse. Has pins/needles feeling as well. Taking norco about 3.5 pills total per day. This has gone on since the 31st. Progressively getting worse, numbness started about 1 week ago. She is left handed so this is interfering with her daily life.  Denies having fever, saddle anesthesia, lower extremity weakness, or problems with stooling or urination.   ROS: See HPI.  [place]: hx bipolar, panic d/o, chronic neck pain  PHYSICAL EXAM: BP 129/85 mmHg  Pulse 89  Temp(Src) 98.4 F (36.9 C) (Oral)  Ht 5\' 4"  (1.626 m)  Wt 165 lb 1.6 oz (74.889 kg)  BMI 28.33 kg/m2 Gen: NAD HEENT: NCAT. Decreased range of motion of neck, specifically with flexion. Fairly good ROM with lateral rotation. Positive spurlings. Ext: sensation intact to light touch over bilat upper extremities. Grip strength 4/5 on L and 5/5 on R.   ASSESSMENT/PLAN:  Encounter for chronic pain management With her gradually worsening pain and now appreciable weakness in L grip, she warrants further evaluation. No signs or sx's concerning for spinal cord compression or cauda equina syndrome. Plan -refill morphine today (slightly incr # tabs due to incr pain) -treat acute flare with prednisone 50mg  daily x 7 days -MRI cervical spine without contrast -refer to spinal surgery -f/u with me in 1 mo but discussed reasons she should f/u sooner than this (if weakness/pain worsen)    FOLLOW UP: F/u in 1 month for neck pain, sooner if worsening Refer to spine surgeon  [name], [place]

## 2014-12-19 ENCOUNTER — Ambulatory Visit (HOSPITAL_COMMUNITY)
Admission: RE | Admit: 2014-12-19 | Discharge: 2014-12-19 | Disposition: A | Discharge status: Home / Self Care | Payer: Self-pay | Source: Ambulatory Visit | Attending: Family Medicine | Admitting: Family Medicine

## 2014-12-19 DIAGNOSIS — M542 Cervicalgia: Secondary | ICD-10-CM

## 2014-12-19 DIAGNOSIS — M4802 Spinal stenosis, cervical region: Secondary | ICD-10-CM | POA: Insufficient documentation

## 2014-12-28 ENCOUNTER — Encounter: Payer: Self-pay | Admitting: Family Medicine

## 2015-01-06 ENCOUNTER — Other Ambulatory Visit: Payer: Self-pay | Admitting: Family Medicine

## 2015-01-06 DIAGNOSIS — M1120 Other chondrocalcinosis, unspecified site: Secondary | ICD-10-CM

## 2015-01-06 MED ORDER — GABAPENTIN 300 MG PO CAPS: 300.0000 mg | ORAL_CAPSULE | Freq: Three times a day (TID) | ORAL | Status: DC

## 2015-01-06 MED ORDER — HYDROCODONE-ACETAMINOPHEN 10-325 MG PO TABS: 1.0000 | ORAL_TABLET | Freq: Three times a day (TID) | ORAL | Status: DC | PRN

## 2015-01-06 MED ORDER — COLCHICINE 0.6 MG PO TABS: 0.6000 mg | ORAL_TABLET | Freq: Two times a day (BID) | ORAL | Status: DC

## 2015-01-06 NOTE — Telephone Encounter (Signed)
Pt saw her neurosurgeon and was told to see Dr. [name] for med refills, pt tried scheduling an appt but first available is 6/14, pt says she cannot wait that long. Pt needs colchicine, neurotin, and hydrocodone, needs to know if pcp will fill meds or does she need to see someone else?

## 2015-01-06 NOTE — Telephone Encounter (Signed)
Okay to go ahead and fill. I sent in refills on her colchicine and neurontin. Hydrocodone rx will be placed at front desk for pt to pick up. She should still make appointment to follow up with me when she is able.  Please inform patient.  Leeanne Rio, MD

## 2015-01-09 ENCOUNTER — Ambulatory Visit (INDEPENDENT_AMBULATORY_CARE_PROVIDER_SITE_OTHER): Payer: Self-pay | Admitting: Family Medicine

## 2015-01-09 ENCOUNTER — Encounter: Payer: Self-pay | Admitting: Family Medicine

## 2015-01-09 VITALS — BP 123/84 | HR 91 | Temp 98.3°F | Ht 65.0 in | Wt 167.5 lb

## 2015-01-09 DIAGNOSIS — M542 Cervicalgia: Secondary | ICD-10-CM

## 2015-01-09 DIAGNOSIS — Z7189 Other specified counseling: Secondary | ICD-10-CM

## 2015-01-09 DIAGNOSIS — G8929 Other chronic pain: Secondary | ICD-10-CM

## 2015-01-09 NOTE — Progress Notes (Signed)
Patient ID: Sandra Briggs, female   DOB: 1976/03/04, 39 y.o.   MRN: [id number]  HPI:  F/u neck pain: Saw neurosurgeon, who said most of her pain was from pseudogout. Also has bone spurs and bulging discs. Was told that surgery would cause more harm than good at this point. She is taking hydrocodone, max hydrocodone 3 per day when pain is particularly bad. Takes 1-2 hydrocodone on a good day. Takes ibuprofen in between. Flexeril also sometimes helps. Has been several years since she did physical therapy. She picked up printed rx this morning from [facility] this morning (as could not get appt until mid-June) but then got a phone call saying that I had an opening so she was able to come in today. Pain is doing well today. Medication continues to help her be functional  ROS: See HPI.  [place]: hx pseudogout causing chronic neck pain, allergic rhinitis, panic disorder, bipolar disorder  PHYSICAL EXAM: BP 123/84 mmHg  Pulse 91  Temp(Src) 98.3 F (36.8 C) (Oral)  Ht 5\' 5"  (1.651 m)  Wt 167 lb 8 oz (75.978 kg)  BMI 27.87 kg/m2 Gen: NAD HEENT: NCAT, full ROM of neck, no posterior neck tenderness or spasm Ext: L grip strength slightly less than R. Full strength w/ shoulder abduction bilat.  ASSESSMENT/PLAN:  Encounter for chronic pain management Repeat MRI after last visit with no findings prompting need for urgent surgical intervention. Pain is doing well now. Already has rx for this month, #75 of hydrocodone 10-325mg . Will plan to decrease to #60 next month. Pt agreeable with this plan. Pain contract signed today. Will also refer to physical therapy as adjunctive method of pain control. F/u in 1 mo.    FOLLOW UP: F/u in 1 month for chronic pain  [name], [place]

## 2015-01-09 NOTE — Telephone Encounter (Signed)
Patient informed, appointment scheduled for today

## 2015-01-09 NOTE — Patient Instructions (Signed)
Continue current medications I'll see you back in 1 month Referring to physical therapy to help manage pain  Be well, Dr. [name]

## 2015-01-09 NOTE — Assessment & Plan Note (Addendum)
Repeat MRI after last visit with no findings prompting need for urgent surgical intervention. Pain is doing well now. Already has rx for this month, #75 of hydrocodone 10-325mg . Will plan to decrease to #60 next month. Pt agreeable with this plan. Pain contract signed today. Will also refer to physical therapy as adjunctive method of pain control. F/u in 1 mo.

## 2015-02-06 ENCOUNTER — Ambulatory Visit: Payer: Self-pay | Admitting: Family Medicine

## 2015-02-06 ENCOUNTER — Other Ambulatory Visit: Payer: Self-pay | Admitting: Family Medicine

## 2015-02-06 DIAGNOSIS — M1120 Other chondrocalcinosis, unspecified site: Secondary | ICD-10-CM

## 2015-02-06 MED ORDER — HYDROCODONE-ACETAMINOPHEN 10-325 MG PO TABS: 1.0000 | ORAL_TABLET | Freq: Three times a day (TID) | ORAL | Status: DC | PRN

## 2015-02-06 NOTE — Telephone Encounter (Signed)
Will refill for patient this time as she has always been appropriate. Red team, please inform patient that rx will be placed at front desk for her but that we will NOT do this again.  Leeanne Rio, MD

## 2015-02-06 NOTE — Telephone Encounter (Signed)
Pt informed and she apologized. Sandra Briggs, CMA

## 2015-02-06 NOTE — Telephone Encounter (Signed)
Pt called to cancel her appointment for today because she said she had to go out of town for work. She would like a refill on her hydrocodone left up front for pick up. jw

## 2015-03-03 ENCOUNTER — Ambulatory Visit (INDEPENDENT_AMBULATORY_CARE_PROVIDER_SITE_OTHER): Payer: Self-pay | Admitting: Family Medicine

## 2015-03-03 ENCOUNTER — Encounter: Payer: Self-pay | Admitting: Family Medicine

## 2015-03-03 VITALS — BP 129/70 | HR 85 | Temp 98.5°F | Ht 65.0 in | Wt 174.0 lb

## 2015-03-03 DIAGNOSIS — Z7189 Other specified counseling: Secondary | ICD-10-CM

## 2015-03-03 DIAGNOSIS — M1180 Other specified crystal arthropathies, unspecified site: Secondary | ICD-10-CM

## 2015-03-03 DIAGNOSIS — M542 Cervicalgia: Secondary | ICD-10-CM

## 2015-03-03 DIAGNOSIS — G8929 Other chronic pain: Secondary | ICD-10-CM

## 2015-03-03 DIAGNOSIS — M1120 Other chondrocalcinosis, unspecified site: Secondary | ICD-10-CM

## 2015-03-03 MED ORDER — HYDROCODONE-ACETAMINOPHEN 10-325 MG PO TABS: 1.0000 | ORAL_TABLET | Freq: Three times a day (TID) | ORAL | Status: DC | PRN

## 2015-03-03 NOTE — Patient Instructions (Signed)
Nice to see you again!  We will be going down to 60 pills today and we are re-referring you to physical therapy.  See you in 2 months

## 2015-03-03 NOTE — Assessment & Plan Note (Signed)
Pain stable, improved with medication. [place] Controlled Substance Database reviewed, findings are appropriate. Pt willing to decrease pill quantity to #60 per month. Continue flexeril as needed as well. Will refer again to PT for evaluation since they could not reach her last time. F/u in 2 mos.

## 2015-03-03 NOTE — Progress Notes (Addendum)
Subjective:     Patient ID: Sandra Briggs, female   DOB: 20-Mar-1976, 39 y.o.   MRN: [id number]  HPI  NECK PAIN:  Patient is on Hydrocodone 10-325. She states that she takes 3 of the hydrocodone pills everyday. The pain is still there and she describes that her neck feels tight and painful. She states that there is a knot on the back of the neck where the pain is focused. She is amenable to reduce the pill count to 60 from 75 which will give her 2 pills a day. She says that she can take the muscle relaxer in between which may help. She has not heard from physical therapy yet. Her voicemail box was full. She would like to have another referral to physical therapy. She states that her neck hurts when she looks down. Patient states she only takes ibuprofen in the mornings.   Review of Systems see HPI     Objective:   Physical Exam  Gen: NAD, pleasant, cooperative Neck: Good range of motion, mild pain when looking down. Posterior neck musculature tight. No rigidity. Psych: normal range of affect, well groomed, speech normal in rate and volume, normal eye contact     Assessment:     39 yo F with chronic neck pain presenting for refill of medication.     Plan:     Encounter for chronic pain management Pain stable, improved with medication. [place] Controlled Substance Database reviewed, findings are appropriate. Pt willing to decrease pill quantity to #60 per month. Continue flexeril as needed as well. Will refer again to PT for evaluation since they could not reach her last time. F/u in 2 mos.        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 Above note edited in full and reflects my personal examination, history, assessment and plan. Sandra Rio, MD

## 2015-04-26 ENCOUNTER — Encounter: Payer: Self-pay | Admitting: Family Medicine

## 2015-04-26 ENCOUNTER — Ambulatory Visit (INDEPENDENT_AMBULATORY_CARE_PROVIDER_SITE_OTHER): Payer: Self-pay | Admitting: Family Medicine

## 2015-04-26 VITALS — BP 133/73 | HR 87 | Temp 98.0°F | Wt 175.3 lb

## 2015-04-26 DIAGNOSIS — F410 Panic disorder [episodic paroxysmal anxiety] without agoraphobia: Secondary | ICD-10-CM

## 2015-04-26 DIAGNOSIS — Z7189 Other specified counseling: Secondary | ICD-10-CM

## 2015-04-26 DIAGNOSIS — M1120 Other chondrocalcinosis, unspecified site: Secondary | ICD-10-CM

## 2015-04-26 DIAGNOSIS — G8929 Other chronic pain: Secondary | ICD-10-CM

## 2015-04-26 DIAGNOSIS — M1180 Other specified crystal arthropathies, unspecified site: Secondary | ICD-10-CM

## 2015-04-26 MED ORDER — ALPRAZOLAM 2 MG PO TABS: ORAL_TABLET | ORAL | Status: DC

## 2015-04-26 MED ORDER — HYDROCODONE-ACETAMINOPHEN 10-325 MG PO TABS: 1.0000 | ORAL_TABLET | Freq: Three times a day (TID) | ORAL | Status: DC | PRN

## 2015-04-26 NOTE — Patient Instructions (Signed)
Refilled pain medicine for 2 months Also refilled xanax for use as needed Watch the weakness closely and call us and neurosurgeon if worsening at all Follow up with me in 2 months  Be well, Dr. [name]

## 2015-04-26 NOTE — Progress Notes (Signed)
  HPI:  Pt presents for f/u of chronic pain & anxiety.  Pain - doing well. Taking one norco twice per day . Has good days and bad days but generally well controlled. Tolerating medication without adverse effects. Denies having fever, saddle anesthesia, lower extremity weakness, or problems with stooling or urination. Does have chronic weakness and numbness in L hand, not worsened recently. Has seen neurosurgery in the recent past but they wanted to defer any surgical intervention.  Anxiety - doing well overall. Denies SI/HI. 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 Has been working cognitively on ways to cope with the stress of flying, to reduce any panic attacks.  ROS: See HPI.  Sandra Briggs: hx panic disorder, bipolar disorder, chronic pain  PHYSICAL EXAM: BP 133/73 mmHg  Pulse 87  Temp(Src) 98 F (36.7 C)  Wt 175 lb 4.8 oz (79.516 kg) Gen: NAD, pleasant, cooperative HEENT: NCAT. Full ROM of neck. Grip strength slightly diminished on L compared to right. Sensation intact over hands. No thenar atrophy appreciated. Shoulder abduction normal bilaterally. Neuro: grossly nonfocal speech normal Psych: normal range of affect, well groomed, speech normal in rate and volume, normal eye contact   ASSESSMENT/PLAN:  PANIC DISORDER Stable. Continue current regimen. Refill of xanax today. Using infrequently, last rx given in January. Discussed cognitive behavioral techniques at some length today, to increase her comfort with flying (expoure and response prevention).  Encounter for chronic pain management Stable pain. Did well with decreasing to #60 pills per month. Continue this dose now. Counseled pt to watch numbness and weakness in L hand closely, if it worsens at all to call her neurosurgeon immediately. F/u in 2 mos.   FOLLOW UP: F/u in 2 months for chronic pain  [name], [place]

## 2015-04-29 NOTE — Assessment & Plan Note (Addendum)
Stable pain. Did well with decreasing to #60 pills per month. Continue this dose now. Counseled pt to watch numbness and weakness in L hand closely, if it worsens at all to call her neurosurgeon immediately. F/u in 2 mos.

## 2015-04-29 NOTE — Assessment & Plan Note (Addendum)
Stable. Continue current regimen. Refill of xanax today. Using infrequently, last rx given in January. Discussed cognitive behavioral techniques at some length today, to increase her comfort with flying (expoure and response prevention).

## 2015-05-18 ENCOUNTER — Ambulatory Visit (INDEPENDENT_AMBULATORY_CARE_PROVIDER_SITE_OTHER): Payer: Self-pay | Admitting: Family Medicine

## 2015-05-18 ENCOUNTER — Encounter: Payer: Self-pay | Admitting: Family Medicine

## 2015-05-18 ENCOUNTER — Telehealth: Payer: Self-pay | Admitting: Family Medicine

## 2015-05-18 VITALS — BP 130/81 | HR 93 | Temp 97.9°F | Ht 64.0 in | Wt 174.5 lb

## 2015-05-18 DIAGNOSIS — M1120 Other chondrocalcinosis, unspecified site: Secondary | ICD-10-CM

## 2015-05-18 DIAGNOSIS — Z7189 Other specified counseling: Secondary | ICD-10-CM

## 2015-05-18 DIAGNOSIS — G8929 Other chronic pain: Secondary | ICD-10-CM

## 2015-05-18 DIAGNOSIS — M542 Cervicalgia: Secondary | ICD-10-CM

## 2015-05-18 MED ORDER — GABAPENTIN 300 MG PO CAPS: 600.0000 mg | ORAL_CAPSULE | Freq: Three times a day (TID) | ORAL | Status: DC

## 2015-05-18 MED ORDER — PREDNISONE 50 MG PO TABS: 50.0000 mg | ORAL_TABLET | Freq: Every day | ORAL | Status: DC

## 2015-05-18 MED ORDER — HYDROCODONE-ACETAMINOPHEN 10-325 MG PO TABS: 1.0000 | ORAL_TABLET | Freq: Three times a day (TID) | ORAL | Status: DC | PRN

## 2015-05-18 NOTE — Patient Instructions (Signed)
Increase gabapentin to 600mg  three times a day Use caution as it might make you sleepy  Okay to go get norco filled now Do a week of prednisone - sent this in to your pharmacy Go to ER if any worsening weakness, bowel/bladder changes, fevers, etc.  Have a good trip!!  Be well, Dr. [name]

## 2015-05-19 ENCOUNTER — Encounter: Payer: Self-pay | Admitting: Family Medicine

## 2015-05-19 NOTE — Progress Notes (Signed)
Date of Visit: 05/18/2015   HPI:  Patient presents to discuss flare of neck pain. Has had increased pain in neck and numbness down left arm for past few days. No increased weakness in L hand. Is going out of town today to [place] until Oct 21. Needs to pick up refill of her chronic narcotics early since she'll be out of town and it has been a hassle in the past for her to get rx filled out of state due to the nature of the controlled substance.  Denies having fever, saddle anesthesia, lower extremity weakness, or problems with stooling or urination.   ROS: See HPI.  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 Some diminished range of motion of neck secondary to pain, but no meningeal signs.  Lungs: normal respiratory effort Neuro: grossly nonfocal speech normal Ext: 4/5 grip L hand, 5/5 R hand. Full ROM of L shoulder, 5/5 strength shoudler abduction bilaterally. Sensation intact over UE bilaterally   ASSESSMENT/PLAN:  Encounter for chronic pain management Called pharmacy to ok early refill since patient going out of town Has never given any indication that she is misusing For acute flare, will add weeklong course of prednisone. Also increase gabapentin. Counseled on risk of sedation. Follow up in 1 month. Discussed red flags which should prompt her to go to ED even while traveling (weakness, etc).   FOLLOW UP: F/u in 1 month for chronic pain  [name], [place]

## 2015-05-19 NOTE — Assessment & Plan Note (Signed)
Called pharmacy to ok early refill since patient going out of town Has never given any indication that she is misusing For acute flare, will add weeklong course of prednisone. Also increase gabapentin. Counseled on risk of sedation. Follow up in 1 month. Discussed red flags which should prompt her to go to ED even while traveling (weakness, etc).
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
## 2015-06-09 ENCOUNTER — Encounter: Payer: Self-pay | Admitting: Family Medicine

## 2015-06-09 ENCOUNTER — Ambulatory Visit (INDEPENDENT_AMBULATORY_CARE_PROVIDER_SITE_OTHER): Payer: Self-pay | Admitting: Family Medicine

## 2015-06-09 VITALS — BP 122/78 | HR 78 | Temp 98.5°F | Ht 64.0 in | Wt 175.0 lb

## 2015-06-09 DIAGNOSIS — F410 Panic disorder [episodic paroxysmal anxiety] without agoraphobia: Secondary | ICD-10-CM

## 2015-06-09 DIAGNOSIS — M1120 Other chondrocalcinosis, unspecified site: Secondary | ICD-10-CM

## 2015-06-09 DIAGNOSIS — G8929 Other chronic pain: Secondary | ICD-10-CM

## 2015-06-09 DIAGNOSIS — M1180 Other specified crystal arthropathies, unspecified site: Secondary | ICD-10-CM

## 2015-06-09 DIAGNOSIS — Z7189 Other specified counseling: Secondary | ICD-10-CM

## 2015-06-09 MED ORDER — HYDROCODONE-ACETAMINOPHEN 10-325 MG PO TABS: 1.0000 | ORAL_TABLET | Freq: Three times a day (TID) | ORAL | Status: DC | PRN

## 2015-06-09 MED ORDER — PREDNISONE 50 MG PO TABS: 50.0000 mg | ORAL_TABLET | Freq: Every day | ORAL | Status: DC

## 2015-06-09 NOTE — Assessment & Plan Note (Signed)
Improved with increased dose of gabapentin Refill norco for 1 month, authorize early refill due to going out of town for work. Will do another round of prednisone since she's again flaring and it helped last time, will want to limit prednisone bursts in the future to avoid adrenal insufficiency Follow up in 1 month Note I was contacted after visit by [place] to confirm authenticity of rx as patient had two narcotic rx's filled at end of September. Explained to pharmacist the situation with [facility] and accidental dispensing to wrong person (with that mis-dispensed medicine being taken into police evidence custody)  and they agreed to fill rx.

## 2015-06-09 NOTE — Patient Instructions (Addendum)
Sent in prednisone for you Refilled gabapentin  Also norco- have the pharmacy call us if they need [place] to okay the refill Good luck on your trip Use the techniques Brad talked to you about today  Be well, Dr. [name]
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
## 2015-06-09 NOTE — Assessment & Plan Note (Signed)
Significant flare of panic attacks with increased flying from work Hopefully her job will change soon and she won't have to fly as much Press photographer saw patient today, see separate note. Would likely benefit from another round of formal CBT. Will follow up with [facility] in clinic Continue xanax prior to flights.

## 2015-06-23 ENCOUNTER — Ambulatory Visit: Payer: Self-pay

## 2015-06-28 ENCOUNTER — Encounter: Payer: Self-pay | Admitting: Family Medicine

## 2015-06-28 ENCOUNTER — Ambulatory Visit (INDEPENDENT_AMBULATORY_CARE_PROVIDER_SITE_OTHER): Payer: Self-pay | Admitting: Family Medicine

## 2015-06-28 VITALS — BP 143/82 | HR 93 | Temp 98.5°F | Ht 64.0 in | Wt 178.6 lb

## 2015-06-28 DIAGNOSIS — F410 Panic disorder [episodic paroxysmal anxiety] without agoraphobia: Secondary | ICD-10-CM

## 2015-06-28 DIAGNOSIS — G8929 Other chronic pain: Secondary | ICD-10-CM

## 2015-06-28 DIAGNOSIS — M1180 Other specified crystal arthropathies, unspecified site: Secondary | ICD-10-CM

## 2015-06-28 DIAGNOSIS — R399 Unspecified symptoms and signs involving the genitourinary system: Secondary | ICD-10-CM

## 2015-06-28 DIAGNOSIS — M1120 Other chondrocalcinosis, unspecified site: Secondary | ICD-10-CM

## 2015-06-28 DIAGNOSIS — Z7189 Other specified counseling: Secondary | ICD-10-CM

## 2015-06-28 LAB — POCT URINALYSIS DIPSTICK
BILIRUBIN UA: NEGATIVE
Blood, UA: NEGATIVE
Glucose, UA: NEGATIVE
KETONES UA: NEGATIVE
LEUKOCYTES UA: NEGATIVE
Nitrite, UA: NEGATIVE
PROTEIN UA: NEGATIVE
Urobilinogen, UA: 0.2
pH, UA: 5.5

## 2015-06-28 MED ORDER — HYDROCODONE-ACETAMINOPHEN 10-325 MG PO TABS: 1.0000 | ORAL_TABLET | Freq: Three times a day (TID) | ORAL | Status: DC | PRN

## 2015-06-28 MED ORDER — CEPHALEXIN 500 MG PO CAPS: 500.0000 mg | ORAL_CAPSULE | Freq: Two times a day (BID) | ORAL | Status: DC

## 2015-06-28 NOTE — Patient Instructions (Signed)
Refilled pain medicine Take keflex 500mg  twice a day for 7 days for the UTI symptoms If that doesn't help call me and we can talk about what to do next See handout below on interstitial cystitis  Follow up once you get back from your travels  Be well, Dr. [name]   Interstitial Cystitis Interstitial cystitis is a condition that causes inflammation of the bladder. The bladder is a hollow organ in the lower part of your abdomen. It stores urine after the urine is made by your kidneys. With interstitial cystitis, you may have pain in the bladder area. You may also have a frequent and urgent need to urinate. The severity of interstitial cystitis can vary from person to person. You may have flare-ups of the condition, and then it may go away for a while. For many people who have this condition, it becomes a long-term problem. CAUSES The cause of this condition is not known. RISK FACTORS This condition is more likely to develop in women. SYMPTOMS Symptoms of interstitial cystitis vary, and they can change over time. Symptoms may include:  Discomfort or pain in the bladder area. This can range from mild to severe. The pain may change in intensity as the bladder fills with urine or as it empties.  Pelvic pain.  An urgent need to urinate.  Frequent urination.  Pain during sexual intercourse.  Pinpoint bleeding on the bladder wall. For women, the symptoms often get worse during menstruation. DIAGNOSIS This condition is diagnosed by evaluating your symptoms and ruling out other causes. A physical exam will be done. Various tests may be done to rule out other conditions. Common tests include:  Urine tests.  Cystoscopy. In this test, a tool that is like a very thin telescope is used to look into your bladder.  Biopsy. This involves taking a sample of tissue from the bladder wall to be examined under a microscope. TREATMENT There is no cure for interstitial cystitis, but treatment  methods are available to control your symptoms. Work closely with your health care provider to find the treatments that will be most effective for you. Treatment options may include:  Medicines to relieve pain and to help reduce the number of times that you feel the need to urinate.  Bladder training. This involves learning ways to control when you urinate, such as:  Urinating at scheduled times.  Training yourself to delay urination.  Doing exercises (Kegel exercises) to strengthen the muscles that control urine flow.  Lifestyle changes, such as changing your diet or taking steps to control stress.  Use of a device that provides electrical stimulation in order to reduce pain.  A procedure that stretches your bladder by filling it with air or fluid.  Surgery. This is rare. It is only done for extreme cases if other treatments do not help. HOME CARE INSTRUCTIONS  Take medicines only as directed by your health care provider.  Use bladder training techniques as directed.  Keep a bladder diary to find out which foods, liquids, or activities make your symptoms worse.  Use your bladder diary to schedule bathroom trips. If you are away from home, plan to be near a bathroom at each of your scheduled times.  Make sure you urinate just before you leave the house and just before you go to bed.  Do Kegel exercises as directed by your health care provider.  Do not drink alcohol.  Do not use any tobacco products, including cigarettes, chewing tobacco, or electronic cigarettes. If you  need help quitting, ask your health care provider.  Make dietary changes as directed by your health care provider. You may need to avoid spicy foods and foods that contain a high amount of potassium.  Limit your drinking of beverages that stimulate urination. These include soda, coffee, and tea.  Keep all follow-up visits as directed by your health care provider. This is important. SEEK MEDICAL CARE  IF:  Your symptoms do not get better after treatment.  Your pain and discomfort are getting worse.  You have more frequent urges to urinate.  You have a fever. SEEK IMMEDIATE MEDICAL CARE IF:  You are not able to control your bladder at all.   This information is not intended to replace advice given to you by your health care provider. Make sure you discuss any questions you have with your health care provider.   Document Released: 04/05/2004 Document Revised: 08/26/2014 Document Reviewed: 04/12/2014 Elsevier Interactive Patient Education [employer].

## 2015-07-02 NOTE — Assessment & Plan Note (Signed)
Note written for work. Patient planning to follow up with [facility] Consult[facility].

## 2015-07-02 NOTE — Progress Notes (Signed)
Patient ID: Sandra Briggs, female   DOB: [birth year], 39 y.o.   MRN: [id number] Date of Visit: 06/28/2015   HPI:  UTI symptoms - thinks she has UTI. Endorses frequency, hesitancy, dysuria. Also some mild low back pain and urinary odor. No fevers. Eating and drinking well. She is status post appendectomy.  Pain medicine - taking her norco twice a day most days. Still having to travel a lot for work, but the trip she's going on today she gets to drive instead of fly. Has noticed improvement in neck pain when she is not flying as she's less tense. Needs another early refill as she's going out of state and it's very difficult to get controlled substances filled out of state. Traveling should stop in January; she's hoping this will be the last early refill.   Anxiety - needs a letter for work saying that flying in planes makes her very anxious.   ROS: See HPI.  [place]: history of bipolar, panic d/o, chronic neck pain, allergic rhinitis  PHYSICAL EXAM: BP 143/82 mmHg  Pulse 93  Temp(Src) 98.5 F (36.9 C) (Oral)  Ht 5\' 4"  (1.626 m)  Wt 178 lb 9.6 oz (81.012 kg)  BMI 30.64 kg/m2 Gen: NAD, pleasant, cooperative HEENT: normocephalic, atraumatic, improved ROM of neck from prior visits Neuro: speech normal, grossly nonfocal Abdomen: no peritoneal signs, rebound or guarding. Does have mild tenderness in RLQ/suprapubic area.  Ext: full grip strength bilaterally, full strength with arm abduction bilaterally Psych: normal range of affect, well groomed, speech normal in rate and volume, normal eye contact   ASSESSMENT/PLAN:  Encounter for chronic pain management Refill medications again today due to traveling out of state Travel should stop soon, can hopefully stop needing early refills Has never given indications of misuse in the past.   PANIC DISORDER Note written for work. Patient planning to follow up with [facility] Consultant.  Urinary symptoms - history suggestive of UTI despite  negative UA. Will treat empirically with keflex. Will give handout on interstitial cystitis for patient to review & see if any symptoms seem familiar.  FOLLOW UP: F/u in ~1 month for routine medical problems  [name], [place]

## 2015-07-02 NOTE — Assessment & Plan Note (Signed)
Refill medications again today due to traveling out of state Travel should stop soon, can hopefully stop needing early refills Has never given indications of misuse in the past.

## 2015-07-18 ENCOUNTER — Ambulatory Visit (INDEPENDENT_AMBULATORY_CARE_PROVIDER_SITE_OTHER): Payer: Self-pay | Admitting: Family Medicine

## 2015-07-18 ENCOUNTER — Encounter: Payer: Self-pay | Admitting: Family Medicine

## 2015-07-18 VITALS — BP 125/75 | HR 90 | Temp 98.5°F | Ht 64.0 in | Wt 178.9 lb

## 2015-07-18 DIAGNOSIS — M1120 Other chondrocalcinosis, unspecified site: Secondary | ICD-10-CM

## 2015-07-18 DIAGNOSIS — R350 Frequency of micturition: Secondary | ICD-10-CM

## 2015-07-18 DIAGNOSIS — M1180 Other specified crystal arthropathies, unspecified site: Secondary | ICD-10-CM

## 2015-07-18 DIAGNOSIS — Z7189 Other specified counseling: Secondary | ICD-10-CM

## 2015-07-18 DIAGNOSIS — G8929 Other chronic pain: Secondary | ICD-10-CM

## 2015-07-18 MED ORDER — HYDROCODONE-ACETAMINOPHEN 10-325 MG PO TABS: 1.0000 | ORAL_TABLET | Freq: Three times a day (TID) | ORAL | Status: DC | PRN

## 2015-07-18 NOTE — Patient Instructions (Signed)
Refilled medicine today, hold on to prescription and fill it after you get back if you have enough medicine on hand  Referring to urology for your urinary symptoms  See me in 4-6 weeks  Be well, Dr. [name]

## 2015-07-19 ENCOUNTER — Telehealth: Payer: Self-pay | Admitting: Family Medicine

## 2015-07-19 NOTE — Assessment & Plan Note (Addendum)
Stable. This will be her last early refill. Encouraged her to keep rx on hand and fill when she returns from her trip if she already has enough at home. Will continue to monitor [place] controlled substance database at each visit until we get back to her regular schedule. Has never given any indications of misuse or diversion. Reviewed database today, no red flags. Follow up in 4-6 weeks.

## 2015-07-19 NOTE — Telephone Encounter (Signed)
FYI: I contacted patient because she missed her appt with Brad on 06/23/2015. She rescheduled for 08/04/2015 at 830am. Thank you, Fonda Kinder, ASA

## 2015-07-19 NOTE — Progress Notes (Signed)
Date of Visit: 07/18/2015   HPI:  Sandra Briggs presents for regular routine follow up.   Chronic pain - taking her norco 2-3 times per day, depending on how bad her pain is. Medicine continues to help her be functional. Denies worsening weakness in left hand, does still have persistent numbness. Has been getting medications refilled early for a couple months now due to frequently having to travel for work. She has her last trip upcoming later this week. She may have enough medications at home to last until she gets back from that visit, but is unsure as she has not counted the pills.  Urinary symptoms - last visit we treated her for a possible UTI despite negative UA. Still having malodorous urine and polyuria. Does report history of kidney stones in the past, has not followed up recently with urology. Previously a patient of Dr. Risa Grill. Agreeable to going back to see him.  ROS: See HPI.  [place]: history of allergic rhinitis, pseudogout with chronic neck pain, bipolar disorder, panic disorder  PHYSICAL EXAM: BP 125/75 mmHg  Pulse 90  Temp(Src) 98.5 F (36.9 C) (Oral)  Ht 5\' 4"  (1.626 m)  Wt 178 lb 14.4 oz (81.149 kg)  BMI 30.69 kg/m2 Gen: NAD, pleasant, cooperative HEENT: normocephalic, atraumatic. Neck nontender to palpation Neuro: alert, grossly nonfocal, speech normal Ext: atraumatic. Grip 4/5 on L, 5/5 on R, consistent with prior exams.  ASSESSMENT/PLAN:  Encounter for chronic pain management Stable. This will be her last early refill. Encouraged her to keep rx on hand and fill when she returns from her trip if she already has enough at home. Will continue to monitor [place] controlled substance database at each visit until we get back to her regular schedule. Has never given any indications of misuse or diversion. Reviewed database today, no red flags. Follow up in 4-6 weeks.  Urinary symptoms Could have some component of interstitial cystitis. Will refer to urology given her history of  renal stones in the past, and for interstitial cystitis workup.  FOLLOW UP: F/u in 4-6 weeks with me for chronic pain Referring to urology  [name], [place]

## 2015-08-04 ENCOUNTER — Ambulatory Visit: Payer: Self-pay

## 2015-08-08 ENCOUNTER — Telehealth: Payer: Self-pay | Admitting: Family Medicine

## 2015-08-08 NOTE — Telephone Encounter (Signed)
FYI: Called pt to reschedule IC appt. Pt gives her apologies to [place], as her three children have been sick and she completely forgot. I made patient appt with PCP as well as with Brad on 08/25/15 @ 8:30am and I have made a note for myself to contact patient the week of appt to remind her. Thank you, Fonda Kinder, ASA

## 2015-08-23 ENCOUNTER — Telehealth: Payer: Self-pay | Admitting: Family Medicine

## 2015-08-23 NOTE — Telephone Encounter (Signed)
Called pt as per pt request to remind her of appointments on Friday with PCP and Brad. Patient confirmed. Sandra Briggs, ASA

## 2015-08-25 ENCOUNTER — Ambulatory Visit: Payer: Self-pay

## 2015-08-25 ENCOUNTER — Ambulatory Visit (INDEPENDENT_AMBULATORY_CARE_PROVIDER_SITE_OTHER): Payer: Self-pay | Admitting: Family Medicine

## 2015-08-25 ENCOUNTER — Encounter: Payer: Self-pay | Admitting: Family Medicine

## 2015-08-25 ENCOUNTER — Ambulatory Visit (INDEPENDENT_AMBULATORY_CARE_PROVIDER_SITE_OTHER): Payer: Self-pay | Admitting: Psychology

## 2015-08-25 VITALS — BP 126/81 | HR 94 | Temp 98.3°F | Ht 64.0 in | Wt 176.0 lb

## 2015-08-25 DIAGNOSIS — M1180 Other specified crystal arthropathies, unspecified site: Secondary | ICD-10-CM

## 2015-08-25 DIAGNOSIS — M1120 Other chondrocalcinosis, unspecified site: Secondary | ICD-10-CM

## 2015-08-25 DIAGNOSIS — Z2089 Contact with and (suspected) exposure to other communicable diseases: Secondary | ICD-10-CM

## 2015-08-25 DIAGNOSIS — G8929 Other chronic pain: Secondary | ICD-10-CM

## 2015-08-25 DIAGNOSIS — F410 Panic disorder [episodic paroxysmal anxiety] without agoraphobia: Secondary | ICD-10-CM

## 2015-08-25 DIAGNOSIS — Z207 Contact with and (suspected) exposure to pediculosis, acariasis and other infestations: Secondary | ICD-10-CM

## 2015-08-25 DIAGNOSIS — Z7189 Other specified counseling: Secondary | ICD-10-CM

## 2015-08-25 MED ORDER — HYDROCODONE-ACETAMINOPHEN 10-325 MG PO TABS: 1.0000 | ORAL_TABLET | Freq: Three times a day (TID) | ORAL | Status: DC | PRN

## 2015-08-25 MED ORDER — PERMETHRIN 1 % EX LIQD: Freq: Once | CUTANEOUS | Status: DC

## 2015-08-25 NOTE — Assessment & Plan Note (Signed)
Stable. Reviewed [place] controlled substance database today with appropriate findings. Refill norco for 3 months.

## 2015-08-25 NOTE — Patient Instructions (Signed)
Prior to application, wash hair with conditioner-free shampoo; rinse with water and towel dry. Apply a sufficient amount of lotion or cream rinse to saturate the hair and scalp (especially behind the ears and nape of neck). Leave on hair for no longer than 10 minutes, then rinse off with warm water; remove remaining nits with nit comb. A single application is generally sufficient; however may repeat 7 days after first treatment if lice or nits are still present.  Come see me again in 3 months - sooner if any issues  Be well, Dr. [name]

## 2015-08-25 NOTE — Progress Notes (Signed)
Date of Visit: 08/25/2015   HPI:  Fatmeh presents for follow up and medication refill.  Chronic pain - doing well. No changes to pain. Taking norco twice daily. No constipation or unwanted side effects. Still dropping things in L hand but not worse than prior.  Lice exposure - all 3 of her kids recently had lice and had to be treated. Wants to be checked for this today. Has not had any scalp itching.  Has appointment with [facility] today. Mood is doing well. She quit her job due to continued travel requirements (had been told these would stop). 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  ROS: See HPI.  [place]: history of bipolar disorder, panic disorder, pseudogout & chronic neck pain  PHYSICAL EXAM: BP 126/81 mmHg  Pulse 94  Temp(Src) 98.3 F (36.8 C) (Oral)  Ht 5\' 4"  (1.626 m)  Wt 176 lb (79.833 kg)  BMI 30.20 kg/m2 Gen: NAD, pleasant, cooperative HEENT: normocephalic, atraumatic. Hair without evidence of lice or nits. Mildly erythematous salmon colored patch to back of scalp on L side along neckline, chronic appearing. Lungs: normal work of breathing  Neuro: alert, grossly nonfocal, speech normal Ext: grip 4/5 on L, 5/5 on R (stable from prior), full strength shoulder abduction and adduction  ASSESSMENT/PLAN:  Encounter for chronic pain management Stable. Reviewed [place] controlled substance database today with appropriate findings. Refill norco for 3 months.   Lice exposure - no nits or lice seen today. Will treat prophylactically with permethrin cream. Patient agreeable to this.  FOLLOW UP: Follow up in 3 mos for chronic pain  [name], [place]

## 2015-08-25 NOTE — Assessment & Plan Note (Addendum)
Patient reported that she changed jobs soon after her last appointment, and no longer has to fly for work. As a result, she has not had any panic attacks related to flying recently. However, patient reports that she still suffers from generalized anxiety. For example, she is worried that her children will stop breathing in their sleep, and frequently calls her ex-husband to check on them and ensure they are still alive. [facility] and patient discussed CBT model of anxiety, and the role of "checking" (e.g., calling her ex-husband to check on children) in maintaining anxiety. [facility] re-taught patient diaphragmatic breathing and progressive muscle relaxation. Patient will follow up with [facility] in two weeks, and agreed to practice diaphragmatic breathing and PMR 5 minutes per day during that time.

## 2015-08-25 NOTE — Progress Notes (Signed)
Reason for follow-up: Patient has generalized anxiety and frequent panic attacks.  Issues discussed: Patient's symptoms, changes in employment (relevant to panic), CBT model of anxiety, diaphragmatic breathing.  Identified goals: Patient would like to improve symptoms of anxiety and panic.

## 2015-09-08 ENCOUNTER — Ambulatory Visit: Payer: Self-pay

## 2015-09-13 ENCOUNTER — Telehealth: Payer: Self-pay | Admitting: Family Medicine

## 2015-09-13 NOTE — Telephone Encounter (Signed)
Attempted to contact pt to reschedule missed appt on 09/08/15 with Brad. Unable to LVM as "mailbox was full". Will attempt to contact once more within next 2 weeks, if pt doesn't initiate contact.

## 2015-11-24 ENCOUNTER — Encounter: Payer: Self-pay | Admitting: Family Medicine

## 2015-11-24 ENCOUNTER — Ambulatory Visit (INDEPENDENT_AMBULATORY_CARE_PROVIDER_SITE_OTHER): Payer: Self-pay | Admitting: Family Medicine

## 2015-11-24 VITALS — BP 110/70 | HR 90 | Temp 98.6°F | Ht 64.0 in | Wt 171.0 lb

## 2015-11-24 DIAGNOSIS — F1190 Opioid use, unspecified, uncomplicated: Secondary | ICD-10-CM

## 2015-11-24 DIAGNOSIS — G8929 Other chronic pain: Secondary | ICD-10-CM

## 2015-11-24 DIAGNOSIS — M1180 Other specified crystal arthropathies, unspecified site: Secondary | ICD-10-CM

## 2015-11-24 DIAGNOSIS — M1120 Other chondrocalcinosis, unspecified site: Secondary | ICD-10-CM

## 2015-11-24 DIAGNOSIS — Z7189 Other specified counseling: Secondary | ICD-10-CM

## 2015-11-24 DIAGNOSIS — F410 Panic disorder [episodic paroxysmal anxiety] without agoraphobia: Secondary | ICD-10-CM

## 2015-11-24 MED ORDER — HYDROCODONE-ACETAMINOPHEN 10-325 MG PO TABS: 1.0000 | ORAL_TABLET | Freq: Three times a day (TID) | ORAL | Status: DC | PRN

## 2015-11-24 MED ORDER — HYDROCODONE-ACETAMINOPHEN 10-325 MG PO TABS
1.0000 | ORAL_TABLET | Freq: Three times a day (TID) | ORAL | Status: DC | PRN
Start: 2015-11-24 — End: 2015-11-24

## 2015-11-24 MED ORDER — ALPRAZOLAM 2 MG PO TABS: ORAL_TABLET | ORAL | Status: DC

## 2015-11-24 MED ORDER — PREDNISONE 50 MG PO TABS: 50.0000 mg | ORAL_TABLET | Freq: Every day | ORAL | Status: DC

## 2015-11-24 NOTE — Progress Notes (Signed)
Date of Visit: 11/24/2015   HPI:  Sandra Briggs presents for routine follow up & medication refill.  Chronic pain - having slight flare of pain right now, thinks because she dealt with a GI bug last week. Her kids got sick and she caught it from them. Had diarrhea and vomiting. Repeated vomiting may have made her neck pain worse. Taking norco generally twice daily, sometimes three times daily if things are really bad. Left hand remains numb. Weakness is unchanged. Has upcoming follow up with neurosurgery scheduled.  Anxiety - requests refill of xanax. Has not had it filled since I last prescribed it, just knowing that she has the rx on hand is enough to help her anxiety. Had a panic attack recently and was able to utilize coping skills to break out of it. Wants to meet with behavioral health consultant Sandra Briggs today. Overall doing well.   ROS: See HPI.  [place]: history of allergic rhinitis, chronic neck pain/pseudogout, panic disorder, bipolar disorder  PHYSICAL EXAM: BP 110/70 mmHg  Pulse 90  Temp(Src) 98.6 F (37 C) (Oral)  Ht 5\' 4"  (1.626 m)  Wt 171 lb (77.565 kg)  BMI 29.34 kg/m2  SpO2 96% Gen: no acute distress, pleasant, cooperative HEENT: normocephalic, atraumatic, moist mucous membranes  Heart: regular rate and rhythm, no murmur Lungs: clear to auscultation bilaterally, normal work of breathing  Neuro: alert, grossly nonfocal, speech normal Ext: grip 4/5 on L, 5/5 on R, consistent with prior exams. Full strength with shoulder abduction bilaterally. Psych: normal range of affect, well groomed, speech normal in rate and volume, normal eye contact   ASSESSMENT/PLAN:  PANIC DISORDER Doing well. Refill of xanax today. Confirmed with controlled substances database that she has NOT in fact refilled the old rx. Likes to just have it on hand as a means of controlling anxiety.  Encounter for chronic pain management Slight flare of pain recently due to GI illness which has now resolved. Will do  7 day course of prednisone as this has helped during flares of pain. [place] Controlled Substance Database reviewed, findings are appropriate. Urine drug screen obtained today. Refilled norco for 3 months. Follow up in 3 months    FOLLOW UP: Follow up in 3 months for chronic pain  [name], [place]

## 2015-11-24 NOTE — Patient Instructions (Signed)
Refilled medicine today Follow up with me in 3 months Sent in prednisone course for your neck  Be well, Dr. [name]

## 2015-11-24 NOTE — Assessment & Plan Note (Signed)
Slight flare of pain recently due to GI illness which has now resolved. Will do 7 day course of prednisone as this has helped during flares of pain. [place] Controlled Substance Database reviewed, findings are appropriate. Urine drug screen obtained today. Refilled norco for 3 months. Follow up in 3 months

## 2015-11-24 NOTE — Assessment & Plan Note (Signed)
Doing well. Refill of xanax today. Confirmed with controlled substances database that she has NOT in fact refilled the old rx. Likes to just have it on hand as a means of controlling anxiety.

## 2015-11-24 NOTE — Progress Notes (Signed)
Reason for follow-up: Patient followed up with [facility] to discuss symptoms of anxiety.  Issues discussed: Patient's current symptoms and level of distress, medication use, coping strategies.  Identified goals: Patient would like to reduce symptoms of anxiety and panic.  Patient reported that she is still enjoying her new job, and is happy that she does not have to fly for work. However, she reports ongoing panic attacks, specifically when worrying about her children's health and well-being. [facility] and patient discussed ways to recognize initial symptoms of a panic attack and implement coping plan by using diaphragmatic breathing and restructuring thoughts (e.g., distracting self with other thoughts, reminding self that panic attacks are very unpleasant but otherwise not harmful). Patient also endorsed extensive checking rituals. For example, she sometimes worries that she has left her hair straightening iron on, and will drive home from work (about 20 minutes) to check. She also reportedly gets out of bed 4-5 times each night to check that she has locked her door out of fear that someone will break in and harm her children; she stated that she has not disclosed this checking behavior before, and would like help in reducing her anxiety. [facility] and patient discussed reinforcing nature of checking behavior and its compulsive (or "addictive") nature. Patient will return in two weeks for follow-up; [facility] will discuss exposure and response prevention therapy with patient at that time.

## 2015-11-25 LAB — DRUG SCR UR, PAIN MGMT, REFLEX CONF
AMPHETAMINE SCRN UR: NEGATIVE
BENZODIAZEPINES.: NEGATIVE
Barbiturate Quant, Ur: NEGATIVE
COCAINE METABOLITES: NEGATIVE
CREATININE, U: 217.18 mg/dL
METHADONE: NEGATIVE
Marijuana Metabolite: NEGATIVE
Opiates: NEGATIVE
PHENCYCLIDINE (PCP): NEGATIVE
Propoxyphene: NEGATIVE

## 2015-12-08 ENCOUNTER — Ambulatory Visit: Payer: Self-pay

## 2016-01-29 ENCOUNTER — Ambulatory Visit (INDEPENDENT_AMBULATORY_CARE_PROVIDER_SITE_OTHER): Payer: Self-pay | Admitting: Internal Medicine

## 2016-01-29 ENCOUNTER — Encounter: Payer: Self-pay | Admitting: Internal Medicine

## 2016-01-29 VITALS — BP 120/80 | HR 78 | Temp 98.2°F | Ht 64.0 in | Wt 170.0 lb

## 2016-01-29 DIAGNOSIS — L259 Unspecified contact dermatitis, unspecified cause: Secondary | ICD-10-CM

## 2016-01-29 MED ORDER — DIPHENHYDRAMINE-ZINC ACETATE 1-0.1 % EX CREA: TOPICAL_CREAM | Freq: Three times a day (TID) | CUTANEOUS | Status: DC | PRN

## 2016-01-29 MED ORDER — PREDNISONE 20 MG PO TABS: 40.0000 mg | ORAL_TABLET | Freq: Every day | ORAL | Status: DC

## 2016-01-29 MED ORDER — HYDROCORTISONE 1 % EX LOTN: 1.0000 "application " | TOPICAL_LOTION | Freq: Two times a day (BID) | CUTANEOUS | Status: DC | PRN

## 2016-01-29 NOTE — Patient Instructions (Signed)
Use benadryl cream and steroid cream for rash. 5 days of oral steroid starting today.

## 2016-01-29 NOTE — Progress Notes (Signed)
   [facility] Kerrin Mo, MD Phone: [phone]  Reason For Visit: Same Day For Rash  # Confluent maculopapular rash on 1 days duration. Patient noticed a rash last night as she was about to go to bed. Per patient, rash started on the back of neck and possibly the scalp. Rash on stomach, breast, arms, back of neck. Rash is very itchy and has a burning sensation at times.  - Has seasonal allergies, but never associated hives or rashes  - No hiking or gardening, recent exposure to plants - Denies outside contacts  - No new antibiotics or other medications  - No new perfume, soaps, or washing up liquids  - Has tried Benadryl Cream, with some relief and cool showers have helped  - No rashes on husband whom she shares the same bed with. No flea bites etc. - Patient has a hx of being very reactive with mosquito bites etc.  - No SOB, nausea/vomitting etc. Hx of anaphylaxis and this does not feel similar.   Past Medical History Reviewed problem list.  Medications- reviewed and updated No additions to family history Social history- patient is a former smoker  Objective: BP 120/80 mmHg  Pulse 78  Temp(Src) 98.2 F (36.8 C) (Oral)  Ht 5\' 4"  (1.626 m)  Wt 170 lb (77.111 kg)  BMI 29.17 kg/m2  SpO2 99% Gen: NAD, alert, cooperative with exam CV: RRR, good S1/S2, no murmur, cap refill <3 Resp: CTABL, no wheezes, non-labored Skin: Maculopapular rash on neck with excoriations, papules on forehead. Macular rash on breast, stomach, and arms.   Assessment/Plan: See problem based a/p  Contact dermatitis Contact dermatitis - maculopapular rash on arms, breast, neck, stomach. 1 days duration. No symptoms of anaphylaxis. Does not resemble hives. Unable to determine origin of allergy  - Due large area of rash, will give patient 5 day steroid burst; hydrocortisone cream 1%, and benadryl cream.

## 2016-01-30 NOTE — Assessment & Plan Note (Signed)
Contact dermatitis - maculopapular rash on arms, breast, neck, stomach. 1 days duration. No symptoms of anaphylaxis. Does not resemble hives. Unable to determine origin of allergy  - Due large area of rash, will give patient 5 day steroid burst; hydrocortisone cream 1%, and benadryl cream.

## 2016-02-12 ENCOUNTER — Ambulatory Visit (INDEPENDENT_AMBULATORY_CARE_PROVIDER_SITE_OTHER): Payer: Self-pay | Admitting: Family Medicine

## 2016-02-12 ENCOUNTER — Encounter: Payer: Self-pay | Admitting: Family Medicine

## 2016-02-12 VITALS — BP 138/77 | HR 88 | Temp 98.6°F | Ht 64.0 in | Wt 173.0 lb

## 2016-02-12 DIAGNOSIS — G8929 Other chronic pain: Secondary | ICD-10-CM

## 2016-02-12 DIAGNOSIS — M1180 Other specified crystal arthropathies, unspecified site: Secondary | ICD-10-CM

## 2016-02-12 DIAGNOSIS — Z7189 Other specified counseling: Secondary | ICD-10-CM

## 2016-02-12 DIAGNOSIS — M1120 Other chondrocalcinosis, unspecified site: Secondary | ICD-10-CM

## 2016-02-12 DIAGNOSIS — F319 Bipolar disorder, unspecified: Secondary | ICD-10-CM

## 2016-02-12 MED ORDER — AMOXICILLIN-POT CLAVULANATE 875-125 MG PO TABS: 1.0000 | ORAL_TABLET | Freq: Two times a day (BID) | ORAL | Status: DC

## 2016-02-12 MED ORDER — HYDROCODONE-ACETAMINOPHEN 10-325 MG PO TABS: 1.0000 | ORAL_TABLET | Freq: Three times a day (TID) | ORAL | Status: DC | PRN

## 2016-02-12 MED ORDER — FLUCONAZOLE 150 MG PO TABS: ORAL_TABLET | ORAL | Status: DC

## 2016-02-12 NOTE — Patient Instructions (Signed)
Sent in augmentin for you to treat a sinus infection Try honey before bedtime Salt water gargles, nasal saline spray Follow up as needed if symptoms worsen or fail to improve.    Refilled pain medication for 3 months Follow up with me in 3 months, sooner if needed  Be well, Dr. [name]

## 2016-02-16 NOTE — Assessment & Plan Note (Signed)
Pain stable. [place] Controlled Substance Database reviewed, findings are appropriate. Refill for 3 months, with ok to fill August rx a little early due to traveling out of town. Follow up 3 months.

## 2016-02-16 NOTE — Progress Notes (Signed)
Date of Visit: 02/12/2016   HPI:  URI symptoms - 6/12 developed itchy rash. Seen here at [facility] and given oral prednisone and topical steroid to treat itching. After that developed fever and upper respiratory symptoms. Fever lasted 3 days. Now coughing a lot, trouble sleeping from it. Rash is improved but some areas still itchy. Eating and drinking well. No vomiting. Had diarrhea at the beginning of it. No further fevers. Main issue now is cough and nasal congestion.  Neck pain - stable. Taking norco twice daily, sometimes takes 3 in a day but will just take less the next day if that's the case. NOT taking colchicine (old medication on her list)  Bipolar disorder - not on any mood controlling medications presently. Was previously on lamictal but not needing any more in a long time. No SI/HI. Mood feels stable. Gabapentin helps.  ROS: See HPI.  [place]: history of chronic pain, panic disorder, neck pain  PHYSICAL EXAM: BP 138/77 mmHg  Pulse 88  Temp(Src) 98.6 F (37 C) (Oral)  Ht 5\' 4"  (1.626 m)  Wt 173 lb (78.472 kg)  BMI 29.68 kg/m2 Gen: NAD, pleasant, cooperative HEENT: normocephalic, atraumatic, moist mucous membranes. Nares congested. Tympanic membranes clear bilaterally. No anterior cervical or supraclavicular lymphadenopathy. Oropharynx clear and moist. Heart: regular rate and rhythm, no murmur Lungs: clear to auscultation bilaterally, normal work of breathing, frequent deep cough Neuro: alert, grossly nonfocal, speech normal Ext: No appreciable lower extremity edema bilaterally  Skin: scattered excoriated macules/papules on anterior chest  ASSESSMENT/PLAN:  Sinusitis - likely bacterial given prolonged symptoms after likely viral illness. May have some lower respiratory infection as well given prominent cough. Treat with augmentin, hopeful for lower tract respiratory coverage as well. Recommend honey before bedtime, salt water gargles, nasal saline spray. rx for  diflucan in case develops yeast vaginitis. I doubt RMSF given pruritic nature of rash & spontaneous resolution of fever.  Follow up as needed if symptoms worsen or fail to improve.   Encounter for chronic pain management Pain stable. [place] Controlled Substance Database reviewed, findings are appropriate. Refill for 3 months, with ok to fill August rx a little early due to traveling out of town. Follow up 3 months.  BIPOLAR DISORDER UNSPECIFIED Stable off medications (just gabapentin, for chronic pain).   FOLLOW UP: Follow up in 3 months for chronic pain  [name], [place]

## 2016-02-16 NOTE — Assessment & Plan Note (Signed)
Stable off medications (just gabapentin, for chronic pain).

## 2016-04-11 ENCOUNTER — Ambulatory Visit (INDEPENDENT_AMBULATORY_CARE_PROVIDER_SITE_OTHER): Payer: Self-pay | Admitting: Family Medicine

## 2016-04-11 ENCOUNTER — Encounter: Payer: Self-pay | Admitting: Family Medicine

## 2016-04-11 DIAGNOSIS — H109 Unspecified conjunctivitis: Secondary | ICD-10-CM | POA: Insufficient documentation

## 2016-04-11 DIAGNOSIS — L03213 Periorbital cellulitis: Secondary | ICD-10-CM | POA: Insufficient documentation

## 2016-04-11 MED ORDER — CIPROFLOXACIN HCL 0.3 % OP SOLN: OPHTHALMIC | 0 refills | Status: DC

## 2016-04-11 MED ORDER — DOXYCYCLINE HYCLATE 100 MG PO TABS: 100.0000 mg | ORAL_TABLET | Freq: Two times a day (BID) | ORAL | 0 refills | Status: DC

## 2016-04-11 MED ORDER — CIPROFLOXACIN HCL 0.3 % OP SOLN: 2.0000 [drp] | OPHTHALMIC | Status: DC

## 2016-04-11 NOTE — Patient Instructions (Signed)
Because of the pain, I am worried that this is more than simple conjunctivitis.  That is why I am putting you on both drops and antibiotics by mouth.  Two prescriptions sent. [facility] preseptal cellulitis.  Because eye infections can get serious quickly, please get seen again if this gets worse.

## 2016-04-12 NOTE — Progress Notes (Signed)
   Subjective:    Patient ID: Sandra Briggs, female    DOB: 24-Feb-1976, 40 y.o.   MRN: [id number]  HPI Patient with hx of recurrent conjunctivitis had left upper eyelid pain and swelling.  No visual changes.  No fever or systemic symptoms.  Only infectious contact is her husband who is currently being treated for a MRSA infection.  She describes the lid pain as severe.  No preexisiting lump in eyelid.    Review of Systems     Objective:   Physical Exam  Visual acuity 20/20 OS.  Mild conjunctival injection No perilimbal redness  Media clear to fundoscopic exam.  Flourescein neg for corneal ulcer.  Medial upper lid is red and exquisitely tender to touch.        Assessment & Plan:

## 2016-04-12 NOTE — Assessment & Plan Note (Signed)
The exquisite pain and MRSA exposure makes me lean strongly to preseptal cellulitis.  Given warnings.  Oral antibiotics in addition to topical cipro.

## 2016-04-12 NOTE — Assessment & Plan Note (Signed)
Will treat with cipro ophth.

## 2016-05-13 ENCOUNTER — Encounter: Payer: Self-pay | Admitting: Family Medicine

## 2016-05-13 ENCOUNTER — Ambulatory Visit (INDEPENDENT_AMBULATORY_CARE_PROVIDER_SITE_OTHER): Payer: Self-pay | Admitting: Family Medicine

## 2016-05-13 VITALS — BP 120/72 | HR 84 | Temp 98.3°F | Ht 64.0 in | Wt 174.0 lb

## 2016-05-13 DIAGNOSIS — F1190 Opioid use, unspecified, uncomplicated: Secondary | ICD-10-CM

## 2016-05-13 DIAGNOSIS — G8929 Other chronic pain: Secondary | ICD-10-CM

## 2016-05-13 DIAGNOSIS — Z23 Encounter for immunization: Secondary | ICD-10-CM

## 2016-05-13 DIAGNOSIS — M1120 Other chondrocalcinosis, unspecified site: Secondary | ICD-10-CM

## 2016-05-13 DIAGNOSIS — M1180 Other specified crystal arthropathies, unspecified site: Secondary | ICD-10-CM

## 2016-05-13 DIAGNOSIS — Z7189 Other specified counseling: Secondary | ICD-10-CM

## 2016-05-13 MED ORDER — HYDROCODONE-ACETAMINOPHEN 10-325 MG PO TABS: 1.0000 | ORAL_TABLET | Freq: Two times a day (BID) | ORAL | 0 refills | Status: DC | PRN

## 2016-05-13 NOTE — Progress Notes (Signed)
Date of Visit: 05/13/2016   HPI:  Presents for follow up of chronic pain. Taking norco twice daily. Helps with function. No unwanted side effects. Is storing her medication safely (keeps in lock box). Pain is gradually getting worse as the years go by, but after discussion with her neurosurgeon, the goal is to prolong neck surgery as long as possible. Weakness in hands is unchanged. Last medication dose this morning.  Of note was seen last month by Dr. Andria Frames & treated for periorbital cellulitis. Completed course of doxycycline. Was also given rx for ophthalmic cipro drops. Has continued using these out of concern the infeciton could return.   ROS: See HPI.  [place]: history of bipolar, chronic pain, panic disorder  PHYSICAL EXAM: BP 120/72 (BP Location: Left Arm, Patient Position: Sitting, Cuff Size: Normal)   Pulse 84   Temp 98.3 F (36.8 C) (Oral)   Ht 5\' 4"  (1.626 m)   Wt 174 lb (78.9 kg)   BMI 29.87 kg/m  Gen: nAD, pleasant, cooperative HEENT: normocephalic, atraumatic, moist mucous membranes  Heart: regular rate and rhythm, no murmur Lungs: clear to auscultation bilaterally, normal work of breathing  Neuro: alert, grossly nonfocal, speech normal Ext: full ROM of neck. Grip 4+/5 on left, 5/5 on right (stable).  ASSESSMENT/PLAN:  Health maintenance:  -flu shot today  Advised to stop ciprofloxacin as cellulitis of periorbital tissue & conjunctivitis have resolved.   Encounter for chronic pain management Stable. [place] Controlled Substance Database reviewed, findings are appropriate. Safe storage reviewed. Handout on risks/benefits of chronic opioids given. UDS today - discussed unexpectedly negative last UDS result. Last dose of medication was this morning. Refills given for 3 months, follow up in 3 months.   FOLLOW UP: Follow up in 3 mos for chronic pain  [name], [place]

## 2016-05-13 NOTE — Patient Instructions (Addendum)
Great to see you today!  Flu shot today Stop the cipro drops - your eye seems better  Refilled medications for 3 months See the handout from the CDC on risks of chronic narcotics - just to be aware and stay safe.  Follow up with me in 3 months  Be well, Dr. [name]

## 2016-05-16 LAB — PAIN MGMT, PROFILE 4 CONF W/O MM, U
AMPHETAMINES: NEGATIVE ng/mL (ref <500)
BARBITURATES: NEGATIVE ng/mL (ref <300)
BENZODIAZEPINES: NEGATIVE ng/mL (ref <100)
COCAINE METABOLITE: NEGATIVE ng/mL (ref <150)
CODEINE: NEGATIVE ng/mL (ref <50)
CREATININE: 181.7 mg/dL (ref >=20.0)
HYDROCODONE: 3754 ng/mL — AB (ref <50)
Hydromorphone: 1037 ng/mL — ABNORMAL HIGH (ref <50)
Methadone Metabolite: NEGATIVE ng/mL (ref <100)
Morphine: NEGATIVE ng/mL (ref <50)
Norhydrocodone: 7306 ng/mL — ABNORMAL HIGH (ref <50)
OXIDANT: NEGATIVE ug/mL (ref <200)
Opiates: POSITIVE ng/mL — AB (ref <100)
Oxycodone: NEGATIVE ng/mL (ref <100)
Phencyclidine: NEGATIVE ng/mL (ref <25)
pH: 6.97 (ref 4.5–9.0)

## 2016-05-16 NOTE — Assessment & Plan Note (Signed)
Stable. [place] Controlled Substance Database reviewed, findings are appropriate. Safe storage reviewed. Handout on risks/benefits of chronic opioids given. UDS today - discussed unexpectedly negative last UDS result. Last dose of medication was this morning. Refills given for 3 months, follow up in 3 months.

## 2016-05-22 ENCOUNTER — Encounter: Payer: Self-pay | Admitting: Family Medicine

## 2016-08-01 ENCOUNTER — Telehealth: Payer: Self-pay | Admitting: *Deleted

## 2016-08-01 DIAGNOSIS — M1120 Other chondrocalcinosis, unspecified site: Secondary | ICD-10-CM

## 2016-08-01 NOTE — Telephone Encounter (Signed)
Pt calling in wanting a refill on her pain meds but you don't have an opening until January. Doesn't need it refilled until the 31st. Please advise. Deseree Kennon Holter, CMA

## 2016-08-05 MED ORDER — HYDROCODONE-ACETAMINOPHEN 10-325 MG PO TABS: 1.0000 | ORAL_TABLET | Freq: Two times a day (BID) | ORAL | 0 refills | Status: DC | PRN

## 2016-08-05 NOTE — Telephone Encounter (Signed)
[place] Controlled Substance Database reviewed, findings are appropriate. Rx for 1 month printed, will place at front desk. Please inform patient she can pick this up. Next fill should be with office visit in January.  Thanks Leeanne Rio, MD

## 2016-08-05 NOTE — Telephone Encounter (Signed)
Pt informed and scheduled for an appt. Deseree Blount, CMA  

## 2016-08-27 ENCOUNTER — Ambulatory Visit (INDEPENDENT_AMBULATORY_CARE_PROVIDER_SITE_OTHER): Payer: Self-pay | Admitting: Family Medicine

## 2016-08-27 ENCOUNTER — Encounter: Payer: Self-pay | Admitting: Family Medicine

## 2016-08-27 DIAGNOSIS — G8929 Other chronic pain: Secondary | ICD-10-CM

## 2016-08-27 DIAGNOSIS — F3170 Bipolar disorder, currently in remission, most recent episode unspecified: Secondary | ICD-10-CM

## 2016-08-27 DIAGNOSIS — M1120 Other chondrocalcinosis, unspecified site: Secondary | ICD-10-CM

## 2016-08-27 DIAGNOSIS — F410 Panic disorder [episodic paroxysmal anxiety] without agoraphobia: Secondary | ICD-10-CM

## 2016-08-27 MED ORDER — HYDROCODONE-ACETAMINOPHEN 10-325 MG PO TABS: 1.0000 | ORAL_TABLET | Freq: Two times a day (BID) | ORAL | 0 refills | Status: DC | PRN

## 2016-08-27 MED ORDER — PREDNISONE 50 MG PO TABS: 50.0000 mg | ORAL_TABLET | Freq: Every day | ORAL | 0 refills | Status: DC

## 2016-08-27 NOTE — Assessment & Plan Note (Signed)
Overall stable despite being off mood stabilizing medications. Continue to monitor.

## 2016-08-27 NOTE — Assessment & Plan Note (Signed)
Stable. Rarely needing xanax (believes she took it just once since getting rx).

## 2016-08-27 NOTE — Progress Notes (Signed)
Date of Visit: 08/27/2016   HPI:  Patient presents for routine follow up.  Chronic pain - taking pain medication in general twice per day. Occasionally increases # of tabs per day during flares, but evens it out with her supply and will take less other days. Some days she does not even need it. Pain has flared in neck the last day or so. Some better today. She is storing medication safely. No unwanted side effects.  Bipolar/Anxiety - doing well. Has not needed xanax but one time since getting rx last spring. No SI/HI. Mood is generally stable. She is moody sometimes, not on any mood stabilizers presently. She is functional and does not think it gets in the way of her living her life.   ROS: See HPI.  [place]: hx bipolar disorder/panic disorder, allergic rhinitis, chronic neck pain  PHYSICAL EXAM: BP 116/82   Pulse 86   Temp 98.2 F (36.8 C) (Oral)   Ht 5\' 4"  (1.626 m)   Wt 176 lb (79.8 kg)   BMI 30.21 kg/m  Gen: NAD, pleasant, cooperative HEENT: normocephalic, atraumatic, moist mucous membranes . Posterior neck musculature tender to palpation. Full range of motion of neck (though with some pain) Heart: regular rate and rhythm, no murmur Lungs: clear to auscultation bilaterally, normal work of breathing  Neuro: alert grossly nonfocal Ext: grip 5/5 on R, 4/5 on L (stable from prior). Shoulder abduction & adduction 5/5 bilaterally.  ASSESSMENT/PLAN:  Health maintenance:  -UTD on health maintenance items  Encounter for chronic pain management Pain overall stable, despite current flair up. Rx prednisone 50mg  daily for 5 days as complementary therapy for flair Continue gabapentin 300mg  twice daily  [place] Controlled Substance Database reviewed, findings are appropriate.  Last UDS Sept 2017 with appropriate findings Again discussed safe storage & risk/benefit ratios Refills given for 3 months. Follow up in 3 months.   PANIC DISORDER Stable. Rarely needing xanax (believes she took it  just once since getting rx).  Bipolar disorder ([place]) Overall stable despite being off mood stabilizing medications. Continue to monitor.  FOLLOW UP: Follow up in 3 mos for chronic pain  [name], [place]

## 2016-08-27 NOTE — Assessment & Plan Note (Addendum)
Pain overall stable, despite current flair up. Rx prednisone 50mg  daily for 5 days as complementary therapy for flair Continue gabapentin 300mg  twice daily  [place] Controlled Substance Database reviewed, findings are appropriate.  Last UDS Sept 2017 with appropriate findings Again discussed safe storage & risk/benefit ratios Refills given for 3 months. Follow up in 3 months.

## 2016-08-27 NOTE — Patient Instructions (Signed)
Gave refills for 3 months on pain medicine  Follow up with me in 3 months   Be well, Dr. [name]

## 2016-12-03 ENCOUNTER — Ambulatory Visit (INDEPENDENT_AMBULATORY_CARE_PROVIDER_SITE_OTHER): Payer: Self-pay | Admitting: Family Medicine

## 2016-12-03 ENCOUNTER — Encounter: Payer: Self-pay | Admitting: Family Medicine

## 2016-12-03 DIAGNOSIS — G8929 Other chronic pain: Secondary | ICD-10-CM

## 2016-12-03 DIAGNOSIS — M1120 Other chondrocalcinosis, unspecified site: Secondary | ICD-10-CM

## 2016-12-03 DIAGNOSIS — F410 Panic disorder [episodic paroxysmal anxiety] without agoraphobia: Secondary | ICD-10-CM

## 2016-12-03 MED ORDER — HYDROCODONE-ACETAMINOPHEN 10-325 MG PO TABS: 1.0000 | ORAL_TABLET | Freq: Two times a day (BID) | ORAL | 0 refills | Status: DC | PRN

## 2016-12-03 MED ORDER — GABAPENTIN 300 MG PO CAPS: 300.0000 mg | ORAL_CAPSULE | Freq: Two times a day (BID) | ORAL | 3 refills | Status: DC

## 2016-12-03 NOTE — Patient Instructions (Addendum)
Call Dr. Arnoldo Morale for an appointment Let us know if you need a referral  On your way out, schedule an appointment one morning to come back for fasting labs. Do not eat or drink anything other than water the morning of your lab appointment until after your labs are drawn.   Next visit in 3 months with me Refilled medications - norco & gabapentin  Be well, Dr. [name]

## 2016-12-03 NOTE — Progress Notes (Signed)
Date of Visit: 12/03/2016   HPI:  Patient presents for refill of pain medication.   Neck pain - overall pain is intensifying in the last month. She feels it is time for her to see the neurosurgeon again. They are trying to hold off on surgery for as long as possible. Is on her feet more at work which is likely contributing. Takes norco generally twice daily. Has not had early refills. Stores medication in a lock box. Also takes ibuprofen 800mg  every morning. Occasionally will repeat the ibuprofen again later in the day, but does this sparingly. Also takes gabapentin with good relief of burning.  Bipolar/Anxiety - doing well. Out of xanax but does not think she needs a refill at this time. No SI/HI. No recent panic attacks. Mood is stable.  ROS: See HPI.  [place]: history of panic disorder, chronic neck pain, bipolar disorder  PHYSICAL EXAM: BP 110/60 (BP Location: Left Arm, Patient Position: Sitting, Cuff Size: Normal)   Pulse 82   Temp 98.2 F (36.8 C) (Oral)   Ht 5\' 4"  (1.626 m)   Wt 170 lb (77.1 kg)   SpO2 99%   BMI 29.18 kg/m  Gen: no acute distress, pleasant, cooperative HEENT: normocephalic, atraumatic, neck full range of motion though with some pain Neuro: alert, grossly nonfocal, speech normal Extremities: grip 4/5 on L, 5/5 on R, consistent with prior exams  ASSESSMENT/PLAN:  Health maintenance:  -return for fasting lipids & CMET  Encounter for chronic pain management [place] Controlled Substance Database reviewed, findings are appropriate.  Discussed safe dosing of ibuprofen, limit extra doses beyond the 800mg  once daily. Check renal function with upcoming labs. Encouraged continued safe storage of her narcotics, specifically discussed limiting access of her teenage children - she is very careful with using a lock box Continue gabapentin. She will call neurosurgeon for a follow up appointment.  Refills provided for 3 months.   PANIC DISORDER Doing quite well. No need for  xanax at this time, she is even declining a refill today If this flares up she can call and I will prescribe it. She has historically used it very sparingly.  FOLLOW UP: Follow up in 3 mos for above issues Schedule with neurosurgery Return for fasting labs  [name], [place]

## 2016-12-05 NOTE — Assessment & Plan Note (Signed)
Doing quite well. No need for xanax at this time, she is even declining a refill today If this flares up she can call and I will prescribe it. She has historically used it very sparingly.

## 2016-12-05 NOTE — Assessment & Plan Note (Addendum)
 Controlled Substance Database reviewed, findings are appropriate.  Discussed safe dosing of ibuprofen, limit extra doses beyond the 800mg  once daily. Check renal function with upcoming labs. Encouraged continued safe storage of her narcotics, specifically discussed limiting access of her teenage children - she is very careful with using a lock box Continue gabapentin. She will call neurosurgeon for a follow up appointment.  Refills provided for 3 months.

## 2017-01-12 LAB — RESULTS CONSOLE HPV: CHL HPV: NEGATIVE

## 2017-03-04 ENCOUNTER — Other Ambulatory Visit: Payer: Self-pay | Admitting: Family Medicine

## 2017-03-04 ENCOUNTER — Telehealth: Payer: Self-pay | Admitting: Family Medicine

## 2017-03-04 DIAGNOSIS — M1120 Other chondrocalcinosis, unspecified site: Secondary | ICD-10-CM

## 2017-03-04 MED ORDER — HYDROCODONE-ACETAMINOPHEN 10-325 MG PO TABS: 1.0000 | ORAL_TABLET | Freq: Two times a day (BID) | ORAL | 0 refills | Status: DC | PRN

## 2017-03-04 NOTE — Telephone Encounter (Signed)
Pt  calling to request refill of:  Name of Medication(s):  Hydrocodone  Last date of OV:  12-03-16 Pharmacy:    Will route refill request to Clinic RN.  Discussed with patient policy to call pharmacy for future refills.  Also, discussed refills may take up to 48 hours to approve or deny.  Sandra Briggs  Pt states she tried to make a f/u appointment right after her last appointment, but was told they couldn't book out that far. Pt made an appointment for August and October today, pt would like PCP to refill pain medication to last until the 8/31 appointment. PCP has no earlier appointments. ep

## 2017-03-04 NOTE — Telephone Encounter (Signed)
Patient called in requesting refill on norco. I called patient to discuss.  I last saw her in April and gave her 3 months rx at that time. She tried multiple times to call and schedule follow up for July, but the schedule was not released that far out, and then suddenly my schedule was completely booked. She has an appointment to see me in late August, but will need 2 rx's before then.  Given that my schedule is entirely booked I think it is appropriate to refill over the phone. [place] Controlled Substance Database reviewed, findings are appropriate, next refill due on July 20. Patient confirms pain is stable. Has followed up in the interval since I saw her with neurosurgery, who does not feel surgery is needed at this time (but eventually it will become necessary).  Advised patient that I would leave two rx's for her at the front desk for her to pick up. Also of note I canceled the Oct appointment as it was scheduled by mistake (october's schedule is not yet made). Patient appreciative.  Leeanne Rio, MD

## 2017-04-18 ENCOUNTER — Ambulatory Visit (INDEPENDENT_AMBULATORY_CARE_PROVIDER_SITE_OTHER): Payer: Self-pay | Admitting: Family Medicine

## 2017-04-18 ENCOUNTER — Encounter: Payer: Self-pay | Admitting: Family Medicine

## 2017-04-18 VITALS — BP 112/70 | HR 96 | Temp 98.6°F | Ht 64.0 in | Wt 155.0 lb

## 2017-04-18 DIAGNOSIS — J069 Acute upper respiratory infection, unspecified: Secondary | ICD-10-CM

## 2017-04-18 DIAGNOSIS — G8929 Other chronic pain: Secondary | ICD-10-CM

## 2017-04-18 DIAGNOSIS — M1120 Other chondrocalcinosis, unspecified site: Secondary | ICD-10-CM

## 2017-04-18 MED ORDER — HYDROCODONE-ACETAMINOPHEN 10-325 MG PO TABS: 1.0000 | ORAL_TABLET | Freq: Two times a day (BID) | ORAL | 0 refills | Status: DC | PRN

## 2017-04-18 NOTE — Progress Notes (Signed)
Date of Visit: 04/18/2017   HPI:  Patient presents for follow-up of her chronic pain.  Chronic pain: Taking Norco 10-3 25 one pill twice daily. Symptoms are well-controlled on this regimen. She is storing it safely. Stores in a locked box. Denies any unwanted adverse effects. Pain is currently tolerable. Patient believes the benefit given to her by these medications continue to outweigh the risks.  Nasal congestion: Since yesterday has been having watery eyes, scratchy throat, cough, sneezing. Had a fever of 100 point some thing last night. Afebrile today. Eating and drinking fine. She is frustrated to be sick.  ROS: See HPI.  [place]: History of bipolar disorder, panic disorder, chronic pain on chronic narcotics  PHYSICAL EXAM: BP 112/70   Pulse 96   Temp 98.6 F (37 C) (Oral)   Ht 5\' 4"  (1.626 m)   Wt 155 lb (70.3 kg)   SpO2 97%   BMI 26.61 kg/m  Gen: No acute distress, pleasant, cooperative. Frequently sneezing and coughing. HEENT: Ears clear discharge, turbinates mildly inflamed. TMs clear bilaterally except for small scarring on right TM. Moist mucous membranes. Tonsils mildly enlarged with no exudates. Mild anterior cervical lymphadenopathy. Heart: Regular rate and rhythm Lungs: Clear bilaterally, normal effort Neuro: Alert, grossly nonfocal, speech normal, grip strength stable bilaterally.  ASSESSMENT/PLAN:  Health maintenance: -Discussed option of beginning mammograms at age 3 or 81. She does have a history of breast cancer in a great aunt. With this history in mind, I recommended she go ahead and start mammograms now that she has turned 41. She was given a handout on how to schedule. -Advised to get flu shot, but she will wait until she is feeling better. She will schedule an appointment to get this done.  Viral URI: Recommend Afrin to relieve nasal congestion. Advised not to take longer than 3 days. Otherwise supportive care.  Encounter for chronic pain  management Stable overall. Controlled substance database reviewed, with appropriate findings. Patient believes the benefits of medication continue to outweigh the risks. I agree with this assessment.  Refilled medication for 3 months. Will be due for UDS at next visit.  FOLLOW UP: Follow up in 3 months for chronic pain  [name], [place]

## 2017-04-18 NOTE — Assessment & Plan Note (Signed)
Stable overall. Controlled substance database reviewed, with appropriate findings. Patient believes the benefits of medication continue to outweigh the risks. I agree with this assessment.  Refilled medication for 3 months. Will be due for UDS at next visit.

## 2017-04-18 NOTE — Progress Notes (Signed)
Ed ref

## 2017-04-18 NOTE — Patient Instructions (Signed)
Get afrin for your nose - don't use more than 3 days  Refilled pain medication for 3 months  See me in 3 months  Schedule flu shot appointment  Be well, Dr. [name]

## 2017-06-05 ENCOUNTER — Ambulatory Visit: Payer: Self-pay | Admitting: Family Medicine

## 2017-07-29 ENCOUNTER — Other Ambulatory Visit: Payer: Self-pay | Admitting: Family Medicine

## 2017-07-29 ENCOUNTER — Encounter: Payer: Self-pay | Admitting: Family Medicine

## 2017-07-29 DIAGNOSIS — M1120 Other chondrocalcinosis, unspecified site: Secondary | ICD-10-CM

## 2017-08-01 MED ORDER — HYDROCODONE-ACETAMINOPHEN 10-325 MG PO TABS: 1.0000 | ORAL_TABLET | Freq: Two times a day (BID) | ORAL | 0 refills | Status: DC | PRN

## 2017-08-01 NOTE — Telephone Encounter (Signed)
Called patient to let her know I sent in a refill of her norco for her electronically. [place] Controlled Substance Database reviewed, findings are appropriate. Patient appreciative.  Leeanne Rio, MD

## 2017-08-01 NOTE — Telephone Encounter (Signed)
Pts phone number has changed  [phone].  Please let her know if dr will refill meds

## 2017-08-14 ENCOUNTER — Other Ambulatory Visit (HOSPITAL_COMMUNITY)
Admission: RE | Admit: 2017-08-14 | Discharge: 2017-08-14 | Disposition: A | Discharge status: Home / Self Care | Payer: Self-pay | Source: Ambulatory Visit | Attending: Family Medicine | Admitting: Family Medicine

## 2017-08-14 ENCOUNTER — Other Ambulatory Visit: Payer: Self-pay

## 2017-08-14 ENCOUNTER — Encounter: Payer: Self-pay | Admitting: Family Medicine

## 2017-08-14 ENCOUNTER — Ambulatory Visit (INDEPENDENT_AMBULATORY_CARE_PROVIDER_SITE_OTHER): Payer: Self-pay | Admitting: Family Medicine

## 2017-08-14 VITALS — BP 118/72 | HR 87 | Temp 98.2°F | Ht 64.0 in | Wt 161.0 lb

## 2017-08-14 DIAGNOSIS — Z124 Encounter for screening for malignant neoplasm of cervix: Secondary | ICD-10-CM

## 2017-08-14 DIAGNOSIS — M1120 Other chondrocalcinosis, unspecified site: Secondary | ICD-10-CM

## 2017-08-14 DIAGNOSIS — R350 Frequency of micturition: Secondary | ICD-10-CM

## 2017-08-14 DIAGNOSIS — M79642 Pain in left hand: Secondary | ICD-10-CM

## 2017-08-14 DIAGNOSIS — M7989 Other specified soft tissue disorders: Secondary | ICD-10-CM

## 2017-08-14 DIAGNOSIS — N930 Postcoital and contact bleeding: Secondary | ICD-10-CM

## 2017-08-14 DIAGNOSIS — M79641 Pain in right hand: Secondary | ICD-10-CM

## 2017-08-14 DIAGNOSIS — G8929 Other chronic pain: Secondary | ICD-10-CM

## 2017-08-14 LAB — POCT URINALYSIS DIP (MANUAL ENTRY)
Bilirubin, UA: NEGATIVE
Glucose, UA: NEGATIVE mg/dL
Ketones, POC UA: NEGATIVE mg/dL
NITRITE UA: NEGATIVE
PH UA: 6.5 (ref 5.0–8.0)
PROTEIN UA: NEGATIVE mg/dL
Spec Grav, UA: 1.02 (ref 1.010–1.025)
Urobilinogen, UA: 0.2 E.U./dL

## 2017-08-14 LAB — POCT UA - MICROSCOPIC ONLY

## 2017-08-14 LAB — POCT SEDIMENTATION RATE: POCT SED RATE: 5 mm/h (ref 0–22)

## 2017-08-14 LAB — POCT URINE PREGNANCY: Preg Test, Ur: NEGATIVE

## 2017-08-14 MED ORDER — CEPHALEXIN 500 MG PO CAPS: 500.0000 mg | ORAL_CAPSULE | Freq: Two times a day (BID) | ORAL | 0 refills | Status: DC

## 2017-08-14 MED ORDER — HYDROCODONE-ACETAMINOPHEN 10-325 MG PO TABS: 1.0000 | ORAL_TABLET | Freq: Two times a day (BID) | ORAL | 0 refills | Status: DC | PRN

## 2017-08-14 NOTE — Progress Notes (Signed)
Date of Visit: 08/14/2017   HPI:  Aleksis presents for routine follow up.  Chronic pain - taking hydrocodone 10-325mg  twice a day. Pain is well controlled on this regimen. No unwanted side effects. Stores it safely in a lock box.  Hand pain/swelling - since summertime has noticed her hands/joints seem swollen and painful. Notices it more in the morning. Has both grandmother and aunt with rheumatoid arthritis.  Postcoital bleeding - noted some spotting after sex last Thursday. Does not get periods, has history of uterine ablation and tubal ligation. Has some urinary hesitancy and frequency. Dysuria with initial stream. Also feels that her urine smells bad. No concerns for STDs.  ROS: See HPI.  [place]: chronic neck pain, panic disorder, allergic rhinitis  PHYSICAL EXAM: BP 118/72   Pulse 87   Temp 98.2 F (36.8 C) (Oral)   Ht 5\' 4"  (1.626 m)   Wt 161 lb (73 kg)   SpO2 96%   BMI 27.64 kg/m  Gen: no acute distress, pleasant, cooperative, well appearing HEENT: normocephalic, atraumatic, moist mucous membranes  Ext: atraumatic normal gait. No deformities or swelling of hands appreciated. GU: normal appearing external genitalia without lesions. Vagina is moist with clear discharge. Cervix with small amount of blood present at cervical os. No cervical masses or lesions noted. No cervical motion tenderness. Mild suprapubic tenderness on bimanual exam. No adnexal masses.   ASSESSMENT/PLAN:  Postcoital bleeding Small amount of blood seen at cervical os today. - update pap smear (current but last sample failed to show transformation zone, so will update) - check gc/chlamydia - discussed option of pelvic ultrasound, but patient has no insurance presently. Will await pap results first. - if still bleeding or having symptoms in several weeks, patient will return for another visit at which time we'll consider ultrasound vs. GYN referral  Hand pain With family history of RA, prudent to obtain  autoimmune labs. Check sed rate, CRP, RF, ANA.   Dysuria UTI with some evidence of infection (leuks, some whites, though not a very clean catch). Will treat empirically with keflex. Hold on urine culture due to cost & patient being self-pay. Monitor for resolution of symptoms.  Health maintenance: Advised to get flu shot at her pharmacy  FOLLOW UP: Follow up as needed if symptoms worsen or fail to improve.  Otherwise 3 months for chronic pain  [name], [place]

## 2017-08-14 NOTE — Patient Instructions (Addendum)
Sent in 3 months of refills for your pain medications.  Checking labs for your hands Get flu shot at your pharmacy  Testing pap smear and for infections.  Sent in antibiotic for your urine Call if still having any discomfort or bleeding in a few weeks and we will have you come back in for another visit  Be well, Dr. [name]

## 2017-08-15 LAB — CMP14+EGFR
A/G RATIO: 2.2 (ref 1.2–2.2)
ALK PHOS: 59 IU/L (ref 39–117)
ALT: 24 IU/L (ref 0–32)
AST: 24 IU/L (ref 0–40)
Albumin: 4.6 g/dL (ref 3.5–5.5)
BILIRUBIN TOTAL: 0.3 mg/dL (ref 0.0–1.2)
BUN/Creatinine Ratio: 20 (ref 9–23)
BUN: 13 mg/dL (ref 6–24)
CHLORIDE: 105 mmol/L (ref 96–106)
CO2: 23 mmol/L (ref 20–29)
Calcium: 9 mg/dL (ref 8.7–10.2)
Creatinine, Ser: 0.64 mg/dL (ref 0.57–1.00)
GFR calc Af Amer: 129 mL/min/{1.73_m2} (ref >59)
GFR calc non Af Amer: 112 mL/min/{1.73_m2} (ref >59)
GLUCOSE: 95 mg/dL (ref 65–99)
Globulin, Total: 2.1 g/dL (ref 1.5–4.5)
POTASSIUM: 4.6 mmol/L (ref 3.5–5.2)
Sodium: 143 mmol/L (ref 134–144)
Total Protein: 6.7 g/dL (ref 6.0–8.5)

## 2017-08-15 LAB — RHEUMATOID FACTOR

## 2017-08-15 LAB — C-REACTIVE PROTEIN: CRP: 0.9 mg/L (ref 0.0–4.9)

## 2017-08-15 LAB — ANA: ANA: NEGATIVE

## 2017-08-18 DIAGNOSIS — N930 Postcoital and contact bleeding: Secondary | ICD-10-CM | POA: Insufficient documentation

## 2017-08-18 DIAGNOSIS — M79643 Pain in unspecified hand: Secondary | ICD-10-CM | POA: Insufficient documentation

## 2017-08-18 LAB — CYTOLOGY - PAP
Chlamydia: NEGATIVE
DIAGNOSIS: NEGATIVE
HPV: NOT DETECTED
NEISSERIA GONORRHEA: NEGATIVE
TRICH (WINDOWPATH): NEGATIVE

## 2017-08-18 NOTE — Assessment & Plan Note (Addendum)
Small amount of blood seen at cervical os today. - update pap smear (current but last sample failed to show transformation zone, so will update) - check gc/chlamydia - discussed option of pelvic ultrasound, but patient has no insurance presently. Will await pap results first. - if still bleeding or having symptoms in several weeks, patient will return for another visit at which time we'll consider ultrasound vs. GYN referral

## 2017-08-18 NOTE — Assessment & Plan Note (Signed)
With family history of RA, prudent to obtain autoimmune labs. Check sed rate, CRP, RF, ANA.

## 2017-08-18 NOTE — Assessment & Plan Note (Signed)
Stable. [place] Controlled Substance Database reviewed, findings are appropriate. Refilled electronically x3 months.

## 2017-08-19 ENCOUNTER — Encounter: Payer: Self-pay | Admitting: Family Medicine

## 2017-09-07 ENCOUNTER — Encounter: Payer: Self-pay | Admitting: Family Medicine

## 2017-09-11 NOTE — Telephone Encounter (Signed)
Called patient & discussed. Double booked her on my schedule for tomorrow morning as she'll need another UA and urine culture. Patient appreciative Leeanne Rio, MD

## 2017-09-12 ENCOUNTER — Encounter: Payer: Self-pay | Admitting: Family Medicine

## 2017-09-12 ENCOUNTER — Ambulatory Visit (INDEPENDENT_AMBULATORY_CARE_PROVIDER_SITE_OTHER): Payer: Self-pay | Admitting: Family Medicine

## 2017-09-12 ENCOUNTER — Other Ambulatory Visit: Payer: Self-pay

## 2017-09-12 VITALS — BP 112/72 | HR 77 | Temp 97.6°F | Ht 64.0 in | Wt 160.0 lb

## 2017-09-12 DIAGNOSIS — M79642 Pain in left hand: Secondary | ICD-10-CM

## 2017-09-12 DIAGNOSIS — R300 Dysuria: Secondary | ICD-10-CM

## 2017-09-12 DIAGNOSIS — N939 Abnormal uterine and vaginal bleeding, unspecified: Secondary | ICD-10-CM

## 2017-09-12 DIAGNOSIS — M79641 Pain in right hand: Secondary | ICD-10-CM

## 2017-09-12 DIAGNOSIS — N930 Postcoital and contact bleeding: Secondary | ICD-10-CM

## 2017-09-12 DIAGNOSIS — R61 Generalized hyperhidrosis: Secondary | ICD-10-CM

## 2017-09-12 LAB — POCT URINALYSIS DIP (MANUAL ENTRY)
BILIRUBIN UA: NEGATIVE mg/dL
Bilirubin, UA: NEGATIVE
GLUCOSE UA: NEGATIVE mg/dL
Leukocytes, UA: NEGATIVE
Nitrite, UA: NEGATIVE
Protein Ur, POC: NEGATIVE mg/dL
SPEC GRAV UA: 1.02 (ref 1.010–1.025)
Urobilinogen, UA: 0.2 E.U./dL
pH, UA: 5.5 (ref 5.0–8.0)

## 2017-09-12 LAB — POCT UA - MICROSCOPIC ONLY

## 2017-09-12 NOTE — Progress Notes (Signed)
Date of Visit: 09/12/2017   HPI:  Patient presents for a same day appointment to discuss continued urinary symptoms.  Still having urinary frequency. I treated her with keflex at her last visit for presumed UTI. Symptoms improved some but then returned. Having pain in bilateral low back as well. No fevers. Does endorse night sweats several times per week, drenches her sheets. Also has had continued vaginal bleeding since last visit. Agreeable with seeing GYN. Patient does have a personal history of kidney stone in the past, and a family history of renal stones. No incontinence.  Hands still bother her with swelling and stiffness in the morning. Rheum labs last visit were normal.  ROS: See HPI  [place]: history of chronic pain, panic disorder, bipolar disorder, overweight  PHYSICAL EXAM: BP 112/72   Pulse 77   Temp 97.6 F (36.4 C) (Oral)   Ht 5\' 4"  (1.626 m)   Wt 160 lb (72.6 kg)   SpO2 99%   BMI 27.46 kg/m  Gen: no acute distress, pleasant, cooperative, well appearing HEENT: normocephalic, atraumatic, moist mucous membranes  Abdomen: soft, nontender to palpation.  Back: mild lower back tenderness to palpation. Neuro: alert, grossly nonfocal, speech normal, gait normal Extremities: hands with no obvious swelling or gross deformities  ASSESSMENT/PLAN:  Urinary frequency - UA today not really consistent with UTI. Will send culture to be sure. Question whether this is actually nephrolithiasis given her history of renal stones in the past. Will give urine strainer and have her increase water intake. Not able to prescribe flomax due to history of anaphylaxis with sulfa drugs & potential for cross reactivity. May need urology referral if symptoms persistent. Discussed option of CT today but with lack of insurance, patient prefers to hold off, which I think is reasonable.  Postcoital bleeding New bleeding after uterine ablation. Will refer to GYN for further evaluation. Check CBC  today.  Hand pain Persistent, normal rheum labs last visit. May need rheumatology referral but patient currently does not have insurance. She is working on Print production planner, after which we may refer to rheumatology.    FOLLOW UP: Referring to GYN Follow up as needed if symptoms worsen or fail to improve.    [place]. [name], [place]

## 2017-09-12 NOTE — Patient Instructions (Addendum)
It was great to see you again today!  Strain your urine Drink lots of water  Sending you to Dr. Hulan Fray Checking bloodwork - thyroid and blood counts. Also checking urine culture.  May need to see urology and/or rheumatology. Work on the Public affairs consultant.  Be well, Dr. [name]

## 2017-09-12 NOTE — Progress Notes (Signed)
urinus

## 2017-09-13 LAB — CBC WITH DIFFERENTIAL/PLATELET
BASOS: 0 %
Basophils Absolute: 0 10*3/uL (ref 0.0–0.2)
EOS (ABSOLUTE): 0.2 10*3/uL (ref 0.0–0.4)
EOS: 2 %
HEMATOCRIT: 41.6 % (ref 34.0–46.6)
HEMOGLOBIN: 14.1 g/dL (ref 11.1–15.9)
IMMATURE GRANS (ABS): 0 10*3/uL (ref 0.0–0.1)
IMMATURE GRANULOCYTES: 0 %
LYMPHS: 27 %
Lymphocytes Absolute: 2 10*3/uL (ref 0.7–3.1)
MCH: 31.5 pg (ref 26.6–33.0)
MCHC: 33.9 g/dL (ref 31.5–35.7)
MCV: 93 fL (ref 79–97)
MONOCYTES: 5 %
Monocytes Absolute: 0.4 10*3/uL (ref 0.1–0.9)
NEUTROS PCT: 66 %
Neutrophils Absolute: 5.1 10*3/uL (ref 1.4–7.0)
Platelets: 197 10*3/uL (ref 150–379)
RBC: 4.48 x10E6/uL (ref 3.77–5.28)
RDW: 12 % — AB (ref 12.3–15.4)
WBC: 7.7 10*3/uL (ref 3.4–10.8)

## 2017-09-13 LAB — TSH: TSH: 0.716 u[IU]/mL (ref 0.450–4.500)

## 2017-09-14 LAB — URINE CULTURE

## 2017-09-16 NOTE — Assessment & Plan Note (Signed)
Persistent, normal rheum labs last visit. May need rheumatology referral but patient currently does not have insurance. She is working on Print production planner, after which we may refer to rheumatology.

## 2017-09-16 NOTE — Assessment & Plan Note (Signed)
New bleeding after uterine ablation. Will refer to GYN for further evaluation. Check CBC today.

## 2017-10-03 ENCOUNTER — Ambulatory Visit: Payer: Self-pay | Admitting: Family Medicine

## 2017-11-03 ENCOUNTER — Encounter: Payer: Self-pay | Admitting: General Practice

## 2017-11-03 ENCOUNTER — Encounter: Payer: Self-pay | Admitting: Obstetrics and Gynecology

## 2017-11-03 ENCOUNTER — Encounter: Payer: Self-pay | Admitting: Family Medicine

## 2017-11-19 ENCOUNTER — Encounter: Payer: Self-pay | Admitting: Family Medicine

## 2017-11-19 ENCOUNTER — Ambulatory Visit (INDEPENDENT_AMBULATORY_CARE_PROVIDER_SITE_OTHER): Payer: Self-pay | Admitting: Family Medicine

## 2017-11-19 DIAGNOSIS — R21 Rash and other nonspecific skin eruption: Secondary | ICD-10-CM | POA: Insufficient documentation

## 2017-11-19 NOTE — Patient Instructions (Signed)
Good to see you today!  Thanks for coming in.  I think this is a viral rash  For the itching  Stop the allegra D start Zyrtec 10 mg  during the day Take benadaryl 25 mg at night Sarna anti -itch lotion will helpful Oatmeal bath will help Use vaseline when itching   Call us if  High fever  Sores in mouth  Feeling bad   Not better in 3-4 days

## 2017-11-19 NOTE — Assessment & Plan Note (Signed)
Probable post viral rash.  No red flags.  Symptomatic treatment see after visit summary

## 2017-11-19 NOTE — Progress Notes (Signed)
Subjective  Patient is presenting with the following illnesses  RASH  Had rash for 2 days.   Had a mild URI a week ago  Location: all over from neck to legs spares palms and soles and face Medications tried: benadryl made her very sleepy Similar rash in past: no Patient believes may be caused by had a viral illness mild a week ago   New medications or antibiotics: no Tick, Insect or new pet exposure: no Recent travel: no New detergent or soap: no Immunocompromised: no  Symptoms Itching: very severe Pain over rash: no Feeling ill all over: no Fever: no Mouth sores: no Face or tongue swelling: no Trouble breathing: no Joint swelling or pain: no  Review of Symptoms - see HPI PMH - Smoking status noted.    Chief Complaint noted Review of Symptoms - see HPI PMH - Smoking status noted.     Objective Vital Signs reviewed Diffuse blanching erythematous MP rash on trunk and extremities.  No herald patch  Mild excoriations,  None on palms or soles  Mouth - no lesions, mucous membranes are moist, no decaying teeth   Eye , Conjunctiva without redness or discharge      Assessments/Plans  Rash and nonspecific skin eruption Probable post viral rash.  No red flags.  Symptomatic treatment see after visit summary    See after visit summary for details of patient instuctions

## 2017-11-21 ENCOUNTER — Encounter: Payer: Self-pay | Admitting: Family Medicine

## 2017-11-21 ENCOUNTER — Ambulatory Visit (INDEPENDENT_AMBULATORY_CARE_PROVIDER_SITE_OTHER): Payer: Self-pay | Admitting: Family Medicine

## 2017-11-21 ENCOUNTER — Other Ambulatory Visit: Payer: Self-pay

## 2017-11-21 VITALS — BP 112/78 | HR 88 | Temp 98.4°F | Ht 64.0 in | Wt 165.2 lb

## 2017-11-21 DIAGNOSIS — G8929 Other chronic pain: Secondary | ICD-10-CM

## 2017-11-21 DIAGNOSIS — R21 Rash and other nonspecific skin eruption: Secondary | ICD-10-CM

## 2017-11-21 DIAGNOSIS — N930 Postcoital and contact bleeding: Secondary | ICD-10-CM

## 2017-11-21 DIAGNOSIS — M1120 Other chondrocalcinosis, unspecified site: Secondary | ICD-10-CM

## 2017-11-21 MED ORDER — METHYLPREDNISOLONE ACETATE 40 MG/ML IJ SUSP
40.0000 mg | Freq: Once | INTRAMUSCULAR | Status: AC
  Administered 2017-11-21: 40 mg via INTRAMUSCULAR

## 2017-11-21 MED ORDER — HYDROCODONE-ACETAMINOPHEN 10-325 MG PO TABS: 1.0000 | ORAL_TABLET | Freq: Two times a day (BID) | ORAL | 0 refills | Status: DC | PRN

## 2017-11-21 MED ORDER — PREDNISONE 50 MG PO TABS: 50.0000 mg | ORAL_TABLET | Freq: Every day | ORAL | 0 refills | Status: DC

## 2017-11-21 NOTE — Patient Instructions (Signed)
Steroid shot today Also sent in prednisone for you Follow up if rash not improving or if you get fevers, feel sick, etc  Refilled pain medication for 3 months Follow up with me in 3 months.  Be well, Dr. [name]

## 2017-11-21 NOTE — Progress Notes (Signed)
Date of Visit: 11/21/2017   HPI:  Patient presents to discuss rash, also for medication refills.  Rash - seen earlier this week by Dr. Erin Hearing for rash that was deemed likely viral in etiology. Rash is very itchy. No fevers. No one else has it. No medication or food changes recently. No sores in mouth or genitals. Very very itchy.  Chronic pain - stable overall. Taking hydrocodone 10-325mg  twice daily. Does not ever take extra doses. No excess sedation. The medication keeps her functional and able to work. Stores it in a safe away from her kids. Mood is stable off psych medications.  GYN referral - needs new referral entered for postcoital bleeding. Not an acute issue, just needs me to enter a new referral so she can schedule with GYN as previously discussed  ROS: See HPI.  [place]: history of chronic neck pain, panic disorder, bipolar disorder  PHYSICAL EXAM: BP 112/78   Pulse 88   Temp 98.4 F (36.9 C) (Oral)   Ht 5\' 4"  (1.626 m)   Wt 165 lb 3.2 oz (74.9 kg)   SpO2 98%   BMI 28.36 kg/m  Gen: no acute distress, pleasant, cooperative HEENT: normocephalic, atraumatic, moist mucous membranes. No oral lesions seen Heart:  Regular rate and rhythm, no murmur Lungs: clear to auscultation bilaterally normal work of breathing  Neuro: alert, grossly nonfocal, speech normal Psych: normal range of affect, well groomed, speech normal in rate and volume, normal eye contact Skin: diffuse maculopapular erythematous pruritic rash without skin breakdown  ASSESSMENT/PLAN:  Health maintenance:  -current on HM items  Rash and nonspecific skin eruption Significant pruritis. Suspect viral rash. No red flags. Will dose depomedrol 40mg  IM here for relief, and do 5 day prednisone burst.  Encounter for chronic pain management Stable pain overall. [place] Controlled Substance Database reviewed, findings are appropriate. Refill x3 months electronically sent to pharmacy.  Another GYN referral  entered.  FOLLOW UP: Follow up in 3 mos for chronic pain, sooner if rash not improving  [name], [place]

## 2017-11-28 NOTE — Assessment & Plan Note (Signed)
Significant pruritis. Suspect viral rash. No red flags. Will dose depomedrol 40mg  IM here for relief, and do 5 day prednisone burst.

## 2017-11-28 NOTE — Assessment & Plan Note (Signed)
Stable pain overall. [place] Controlled Substance Database reviewed, findings are appropriate. Refill x3 months electronically sent to pharmacy.

## 2018-01-15 ENCOUNTER — Ambulatory Visit (INDEPENDENT_AMBULATORY_CARE_PROVIDER_SITE_OTHER): Payer: Self-pay | Admitting: Obstetrics & Gynecology

## 2018-01-15 ENCOUNTER — Encounter: Payer: Self-pay | Admitting: Obstetrics & Gynecology

## 2018-01-15 VITALS — BP 129/72 | HR 95 | Wt 162.3 lb

## 2018-01-15 DIAGNOSIS — N941 Unspecified dyspareunia: Secondary | ICD-10-CM

## 2018-01-15 DIAGNOSIS — N930 Postcoital and contact bleeding: Secondary | ICD-10-CM

## 2018-01-15 DIAGNOSIS — M542 Cervicalgia: Secondary | ICD-10-CM

## 2018-01-15 NOTE — Progress Notes (Signed)
GYNECOLOGY OFFICE VISIT NOTE  History:  42 y.o. [id number] here today with report of postcoital bleeding for a few months; dyspareunia and pelvic pain.  She reported these symptoms to her PCP, Dr. [name], in 07/2017.  She had a normal pap and STI screen; it was noted she had significant bleeding during pap due to friable cervical surface.  Patient reports that she feels "tugging" and pain during arousal, and also increasing dyspareunia.  The bleeding always occurs upon contact with her cervix, was initially heavy but now has just spotting. She is in so much pain during intercourse, she avoids it now.   She denies any abnormal vaginal discharge or other gynecologic concerns.   Past Medical History:  Diagnosis Date  . Anxiety   . Chronic pain    flank  . Complication of anesthesia   . Dysuria-frequency syndrome   . Exercise-induced asthma   . Panic disorder   . Sprain of MCL (medial collateral ligament) of knee     Past Surgical History:  Procedure Laterality Date  . APPENDECTOMY    . CESAREAN SECTION    . CRYOABLATION N/A   . ENDOMETRIAL ABLATION  2009  . TUBAL LIGATION  06/28/2005    The following portions of the patient's history were reviewed and updated as appropriate: allergies, current medications, past family history, past medical history, past social history, past surgical history and problem list.   Health Maintenance:  Normal pap and negative HRHPV and STI screen on 08/14/2017.  Review of Systems:  Pertinent items noted in HPI and remainder of comprehensive ROS otherwise negative.  Objective:  Physical Exam BP 129/72 (BP Location: Right Arm)   Pulse 95   Wt 162 lb 4.8 oz (73.6 kg)   BMI 27.86 kg/m  CONSTITUTIONAL: Well-developed, well-nourished female in no acute distress.  HEENT:  Normocephalic, atraumatic. External right and left ear normal. No scleral icterus.  NECK: Normal range of motion, supple, no masses noted on observation SKIN: Skin is warm and dry. No  rash noted. Not diaphoretic. No erythema. No pallor. MUSCULOSKELETAL: Normal range of motion. No edema noted. NEUROLOGIC: Alert and oriented to person, place, and time. Normal muscle tone coordination. No cranial nerve deficit noted. PSYCHIATRIC: Normal mood and affect. Normal behavior. Normal judgment and thought content. CARDIOVASCULAR: Normal heart rate noted RESPIRATORY: Effort and breath sounds normal, no problems with respiration noted ABDOMEN: Soft, no distention noted.   PELVIC: Normal appearing external genitalia; normal appearing vaginal mucosa and cervix. Cervical surface around TZ noted to be friable upon palpation, no apparent ectropion noted.  No abnormal discharge noted.  Normal uterine size, no other palpable masses, + anterior uterine tenderness. No adnexal tenderness.  Labs and Imaging No results found for this or any previous visit (from the past 168 hour(s)). No results found.  Assessment & Plan:  1. Postcoital bleeding Given her symptoms, she may benefit from cryotherapy of the transformation zone. This will help in destruction of the friable cells, and hopefully sturdier squamous cells can arise and not have this effect.  This will be scheduled pending ultrasound results.  2. Cervicalgia 3. Dyspareunia in female Need to evaluate for possible new structural lesions of uterus/adnexa; ultrasound ordered. Also concerned about possible pain from scar tissue, had C/S x 3, endometrial ablation.  Recommended trial of Ibuprofen 800 mg po prior to intercourse in the interim - US PELVIC COMPLETE WITH TRANSVAGINAL; Future Will follow up results and manage accordingly.  For all her symptoms, and given her history,  patient is considering hysterectomy for definitive management. This will be discussed in detail at later appointments.    Routine preventative health maintenance measures emphasized. Please refer to After Visit Summary for other counseling recommendations.   Return if  symptoms worsen or fail to improve.   Total face-to-face time with patient: 20 minutes.  Over 50% of encounter was spent on counseling and coordination of care.   Verita Schneiders, MD, [place] for [employer], [place]

## 2018-01-15 NOTE — Patient Instructions (Signed)
Return to clinic for any scheduled appointments or for any gynecologic concerns as needed.   

## 2018-01-22 ENCOUNTER — Ambulatory Visit (HOSPITAL_COMMUNITY)
Admission: RE | Admit: 2018-01-22 | Discharge: 2018-01-22 | Disposition: A | Discharge status: Home / Self Care | Payer: Self-pay | Source: Ambulatory Visit | Attending: Obstetrics & Gynecology | Admitting: Obstetrics & Gynecology

## 2018-01-22 DIAGNOSIS — M542 Cervicalgia: Secondary | ICD-10-CM

## 2018-01-22 DIAGNOSIS — N941 Unspecified dyspareunia: Secondary | ICD-10-CM | POA: Insufficient documentation

## 2018-02-04 ENCOUNTER — Encounter: Payer: Self-pay | Admitting: Obstetrics & Gynecology

## 2018-02-13 ENCOUNTER — Encounter: Payer: Self-pay | Admitting: Family Medicine

## 2018-02-13 ENCOUNTER — Other Ambulatory Visit: Payer: Self-pay | Admitting: Family Medicine

## 2018-02-13 DIAGNOSIS — M1120 Other chondrocalcinosis, unspecified site: Secondary | ICD-10-CM

## 2018-02-13 MED ORDER — HYDROCODONE-ACETAMINOPHEN 10-325 MG PO TABS: 1.0000 | ORAL_TABLET | Freq: Two times a day (BID) | ORAL | 0 refills | Status: DC | PRN

## 2018-02-17 ENCOUNTER — Ambulatory Visit (INDEPENDENT_AMBULATORY_CARE_PROVIDER_SITE_OTHER): Payer: Self-pay | Admitting: Family Medicine

## 2018-02-17 ENCOUNTER — Other Ambulatory Visit: Payer: Self-pay

## 2018-02-17 ENCOUNTER — Encounter: Payer: Self-pay | Admitting: Family Medicine

## 2018-02-17 DIAGNOSIS — M1120 Other chondrocalcinosis, unspecified site: Secondary | ICD-10-CM

## 2018-02-17 MED ORDER — PREDNISONE 50 MG PO TABS: 50.0000 mg | ORAL_TABLET | Freq: Every day | ORAL | 0 refills | Status: DC

## 2018-02-17 MED ORDER — HYDROCODONE-ACETAMINOPHEN 10-325 MG PO TABS: 1.0000 | ORAL_TABLET | Freq: Two times a day (BID) | ORAL | 0 refills | Status: DC | PRN

## 2018-02-17 MED ORDER — HYDROCODONE-ACETAMINOPHEN 10-325 MG PO TABS
1.0000 | ORAL_TABLET | Freq: Two times a day (BID) | ORAL | 0 refills | Status: DC | PRN
Start: 2018-02-17 — End: 2018-02-17

## 2018-02-17 MED ORDER — DOXYCYCLINE HYCLATE 100 MG PO TABS: 100.0000 mg | ORAL_TABLET | Freq: Two times a day (BID) | ORAL | 0 refills | Status: DC

## 2018-02-17 NOTE — Progress Notes (Signed)
Date of Visit: 02/17/2018   HPI:  Patient presents to follow up and also for acute illness.  Feeling sick: Has felt sick for 11-12 days. Had stomach symptoms with nausea and diarrhea, then sore throat. Possible fever but hasn't checked. Has itchy rash on the back of her neck. On being asked, she does recall a tick bite 3 weeks ago.  Chronic pain - taking norco 10-325mg  twice daily. Tolerating well, no side effects. Storing safely. No difficulty controlling how often she takes the medication. Helps her be functional.  ROS: See HPI.  [place]: history of chronic neck pain  PHYSICAL EXAM: BP 118/80   Pulse 83   Temp 97.9 F (36.6 C) (Oral)   Ht 5\' 4"  (1.626 m)   Wt 160 lb (72.6 kg)   SpO2 97%   BMI 27.46 kg/m  Gen: no acute distress, pleasant, cooperative HEENT: normocephalic, atraumatic, moist mucous membranes. Tympanic membranes clear bilaterally. Nares with mild discharge. Oropharynx clear and moist. No major anterior cervical lymphadenopathy. Full ROM of neck Heart: regular rate and rhythm, no murmur Lungs: clear to auscultation bilaterally, normal work of breathing  Neuro: alert, grossly nonfocal, speech normal Ext: grip 5/5 bilaterally.  Skin: erythematous macular rash on posterior neck, no skin breakdown or weeping  ASSESSMENT/PLAN:  Health maintenance:  -current on HM items  Chronic pain [place] Controlled Substance Database reviewed, findings are appropriate.  Patient stable on current regimen. Benefits outweigh risks for this patient.  Refills for 3 months given, should need to return mid October.  [place] symptoms Summertime flu symptoms with recent tick bite - will treat empirically for RMSF though patient afebrile. rx for doxycycline sent in. Also rx for prednisone given significantly pruritic rash (may be viral). Patient to follow up if not improving.  FOLLOW UP: Follow up in mid October for chronic pain  [name], [place]

## 2018-02-17 NOTE — Patient Instructions (Signed)
Sent in prednisone and doxycycline for the flu like illness you're having. Return if not much better by the end of this week.  Refilled 3 months of pain medications. Will need to return mid-October for next refill - see any doctor here.  Be well, Dr. [name]

## 2018-05-25 ENCOUNTER — Encounter: Payer: Self-pay | Admitting: Family Medicine

## 2018-05-25 ENCOUNTER — Ambulatory Visit: Payer: BLUE CROSS/BLUE SHIELD | Admitting: Family Medicine

## 2018-05-25 ENCOUNTER — Other Ambulatory Visit: Payer: Self-pay

## 2018-05-25 VITALS — BP 128/78 | HR 86 | Temp 98.3°F | Wt 162.0 lb

## 2018-05-25 DIAGNOSIS — G8929 Other chronic pain: Secondary | ICD-10-CM | POA: Diagnosis not present

## 2018-05-25 DIAGNOSIS — M1120 Other chondrocalcinosis, unspecified site: Secondary | ICD-10-CM | POA: Diagnosis not present

## 2018-05-25 DIAGNOSIS — M542 Cervicalgia: Secondary | ICD-10-CM

## 2018-05-25 MED ORDER — HYDROCODONE-ACETAMINOPHEN 10-325 MG PO TABS: 1.0000 | ORAL_TABLET | Freq: Two times a day (BID) | ORAL | 0 refills | Status: DC | PRN

## 2018-05-25 MED ORDER — HYDROCODONE-ACETAMINOPHEN 10-325 MG PO TABS: 1.0000 | ORAL_TABLET | Freq: Two times a day (BID) | ORAL | 0 refills | Status: DC

## 2018-05-25 NOTE — Progress Notes (Signed)
  Patient Name: Sandra Briggs Date of Birth: [birth year] Date of Visit: 05/25/18 PCP: Leeanne Rio, MD  Chief Complaint: medication refill   Subjective: [name] is a pleasant 42 y.o. 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  She reports a 'hard spot' between her right toes number four and five. This causes pain at work. She works at [employer] and [employer] her feet all day. She wears converses at work. Denies numbness or tingling in toes.   The patient takes Norco 10-325 mg BID. She does not take this at work. Denies sedation, euphoria, or constipation with this. Finds it helps her pain. Allows her to function at work.    ROS:  ROS  As above.   I have reviewed the patient's medical, surgical, family, and social history as appropriate.   Vitals:   05/25/18 0946  BP: 128/78  Pulse: 86  Temp: 98.3 F (36.8 C)  SpO2: 98%   Filed Weights   05/25/18 0946  Weight: 162 lb (73.5 kg)   HEENT: Sclera anicteric. Dentition is moderate. Appears well hydrated. Neck: Supple Cardiac: Warm well perfused Lungs: Breathing comfortably on room air  Skin: Along the lateral edge of the fourth toe on right there is a hard erythematous small nodule minimally pink, no drainage.  Dyn[place] was seen today for medication refill.  Diagnoses and all orders for this visit:  Chronic neck pain -     HYDROcodone-acetaminophen (NORCO) 10-325 MG tablet; Take 1 tablet by mouth every 12 (twelve) hours as needed for severe pain. Patient reports she will fill 2 days early as [place] heading to beach  -     HYDROcodone-acetaminophen ([place]) 10-325 MG tablet; Take 1 tablet by mouth every 12 (twelve) hours. November refill  -     HYDROcodone-acetaminophen (NORCO) 10-325 MG tablet; Take 1 tablet by mouth every 12 (twelve) hours. December refill  The patient was given refills for 3 months. Reviewed  PMP which is appropriate. Reviewed side effects and adverse effects. The benefits outweigh the risks of this medication for Ms. Heesch.   Right Toe Callus,  advised patient to consider wider shoes and potential soft small toe spacer for small digits.  Reviewed reasons to call and return to care  Dorris Singh, MD  [facility] Medicine Teaching Service

## 2018-05-25 NOTE — Patient Instructions (Signed)
It was wonderful to see you today.  Thank you for choosing [facility].   Please call [phone] with any questions about today's appointment.  Please be sure to schedule follow up at the front  desk before you leave today.   Izzabell Klasen, MD  Family Medicine    

## 2018-05-25 NOTE — Assessment & Plan Note (Signed)
Stable and doing well.  Discussed long-term risks of this medication.  For this patient the benefits outweigh the risks.  Reviewed [facility] monitoring program.  This is appropriate refilled for 3 months.

## 2018-05-29 ENCOUNTER — Telehealth: Payer: Self-pay | Admitting: Family Medicine

## 2018-05-29 NOTE — Telephone Encounter (Signed)
Hydrocodone cannot be filled at [facility] because although you wrote a note on it, they need you to call them asap , please. Thank you

## 2018-05-29 NOTE — Telephone Encounter (Signed)
Called the pharmacy.  The patient is not due for refill until October 19.  Reviewed PMP she has never refilled early previously.  Spoke with patient she is going on a work-related trip to [place], outside of [place], for 18 days.  Her work is stressful and will require to be on her feet.  She works for a [employer], [employer].  Called pharmacy back and approved early refill of this medication discussed this would be a one-time only refill.  Reviewed reasons to call or return to care.

## 2018-08-25 ENCOUNTER — Ambulatory Visit: Payer: BLUE CROSS/BLUE SHIELD | Admitting: Family Medicine

## 2018-08-25 ENCOUNTER — Encounter: Payer: Self-pay | Admitting: Family Medicine

## 2018-08-25 ENCOUNTER — Other Ambulatory Visit: Payer: Self-pay

## 2018-08-25 DIAGNOSIS — M542 Cervicalgia: Secondary | ICD-10-CM | POA: Diagnosis not present

## 2018-08-25 DIAGNOSIS — G8929 Other chronic pain: Secondary | ICD-10-CM | POA: Diagnosis not present

## 2018-08-25 DIAGNOSIS — Z23 Encounter for immunization: Secondary | ICD-10-CM | POA: Diagnosis not present

## 2018-08-25 MED ORDER — HYDROCODONE-ACETAMINOPHEN 10-325 MG PO TABS: 1.0000 | ORAL_TABLET | Freq: Two times a day (BID) | ORAL | 0 refills | Status: DC

## 2018-08-25 MED ORDER — HYDROCODONE-ACETAMINOPHEN 10-325 MG PO TABS: 1.0000 | ORAL_TABLET | Freq: Two times a day (BID) | ORAL | 0 refills | Status: DC | PRN

## 2018-08-25 NOTE — Progress Notes (Addendum)
    Subjective:    Patient ID: Sandra Briggs, female    DOB: 05/19/1976, 43 y.o.   MRN: [id number]    HPI Patient is in today for Medication Refill  Chronic Neck Pain Sandra Briggs presents with chronic neck pain. She states her pain is currently at 3-4/10. She is taking Gabapentin and Norco daily and feels this regimen is controlling her pain well. She occasionally uses 800mg  Ibuprofen, but has not needed it in a couple months. Denies having any side effects from the norco. She expresses no concern regarding safely storing Norco as she utilizes a lockbox which she locks every use. No concern regarding deviation from controlled, consistent use. Takes it twice daily, sometimes if pain is bad will take an extra dose but then takes one less dose the next day.    Objective:    Physical Exam  Cardiovascular: Normal rate, regular rhythm and normal heart sounds.  Pulmonary/Chest: Effort normal and breath sounds normal.  Abdominal: Soft. Bowel sounds are normal.  Musculoskeletal: Normal range of motion.        General: Tenderness present.     Comments: Cervical tenderness with chin-down movements  Neurological: She exhibits normal muscle tone. Coordination normal.  Equal 5/5 UE strength BL    BP 110/70   Pulse 80   Temp 98.4 F (36.9 C) (Oral)   Ht 5\' 4"  (1.626 m)   Wt 166 lb (75.3 kg)   SpO2 100%   BMI 28.49 kg/m       Assessment & Plan:   Health maintenance: - flu shot given today  Encounter for chronic pain management Continued neck pain likely due to combination of pseudogout and degenerative disc disease. Reviewed PMP which is appropriate. No side effects, storing medication safely. -Continue Norco 10-325mg , 3 month refill -Continue Gabapentin -Encouraged to alternate Tylenol/Ibuprofen if needed regularly    Boykin Peek, Medical Student   Patient seen along with MS3 student Berlinda Last. I personally evaluated this patient along with the student, and verified  all aspects of the history, physical exam, and medical decision making as documented by the student. I agree with the student's documentation and have made all necessary edits.  Chrisandra Netters, MD [place]

## 2018-08-25 NOTE — Patient Instructions (Signed)
Sent in refills Follow up in 3 months  Be well, Dr. [name]

## 2018-08-28 NOTE — Assessment & Plan Note (Signed)
Continued neck pain likely due to combination of pseudogout and degenerative disc disease. Reviewed PMP which is appropriate. No side effects, storing medication safely. -Continue Norco 10-325mg , 3 month refill -Continue Gabapentin -Encouraged to alternate Tylenol/Ibuprofen if needed regularly

## 2018-11-17 ENCOUNTER — Other Ambulatory Visit: Payer: Self-pay | Admitting: Family Medicine

## 2018-11-17 ENCOUNTER — Encounter: Payer: Self-pay | Admitting: Family Medicine

## 2018-11-17 DIAGNOSIS — M542 Cervicalgia: Principal | ICD-10-CM

## 2018-11-17 DIAGNOSIS — G8929 Other chronic pain: Secondary | ICD-10-CM

## 2018-11-20 ENCOUNTER — Other Ambulatory Visit: Payer: Self-pay

## 2018-11-20 ENCOUNTER — Telehealth (INDEPENDENT_AMBULATORY_CARE_PROVIDER_SITE_OTHER): Payer: BLUE CROSS/BLUE SHIELD | Admitting: Family Medicine

## 2018-11-20 DIAGNOSIS — G8929 Other chronic pain: Secondary | ICD-10-CM | POA: Diagnosis not present

## 2018-11-20 DIAGNOSIS — M542 Cervicalgia: Secondary | ICD-10-CM

## 2018-11-20 MED ORDER — HYDROCODONE-ACETAMINOPHEN 10-325 MG PO TABS: 1.0000 | ORAL_TABLET | Freq: Two times a day (BID) | ORAL | 0 refills | Status: DC | PRN

## 2018-11-20 MED ORDER — HYDROCODONE-ACETAMINOPHEN 10-325 MG PO TABS: 1.0000 | ORAL_TABLET | Freq: Two times a day (BID) | ORAL | 0 refills | Status: DC

## 2018-11-20 MED ORDER — GABAPENTIN 300 MG PO CAPS: 300.0000 mg | ORAL_CAPSULE | Freq: Two times a day (BID) | ORAL | 3 refills | Status: DC

## 2018-11-20 NOTE — Assessment & Plan Note (Signed)
Indication for chronic opioid: Cervicalgia, chronic neck pain, pseudogout Medication and dose: Norco 10-325mg  every 8h as needed # pills per month: 60 Last UDS date: September 2017 Opioid Treatment Agreement signed (Y/N): Yes, signed 01/11/2015 NCCSRS reviewed this encounter (include red flags):  yes, reviewed today with no red flags

## 2018-11-20 NOTE — Progress Notes (Signed)
[facility] Telemedicine Visit  Patient consented to have visit conducted via telephone.  Encounter participants: Patient: Sandra Briggs  Provider: Bufford Lope  Others (if applicable): none  Chief Complaint: Med refills  HPI:  Patient has had longstanding chronic neck pain and pseudogout.  She reports good compliance on her gabapentin and Norco.  She also uses over-the-counter Tylenol/ibuprofen with good relief.  She does not run out of her medications early.  She only uses them as directed.  She does not use any other medications or recreational drugs.   ROS: per HPI  Pertinent PMHx: Cervicalgia, pseudogout, bipolar, allergic rhinitis   Assessment/Plan:  Encounter for chronic pain management Indication for chronic opioid: Cervicalgia, chronic neck pain, pseudogout Medication and dose: Norco 10-325mg  every 8h as needed # pills per month: 60 Last UDS date: September 2017 Opioid Treatment Agreement signed (Y/N): Yes, signed 01/11/2015 NCCSRS reviewed this encounter (include red flags):  yes, reviewed today with no red flags    Time spent on phone with patient: 6 minutes  Billing: yes   Bufford Lope, DO PGY-3, [facility] 11/20/2018 3:38 PM

## 2019-02-15 ENCOUNTER — Encounter: Payer: Self-pay | Admitting: Family Medicine

## 2019-02-15 ENCOUNTER — Other Ambulatory Visit: Payer: Self-pay

## 2019-02-15 ENCOUNTER — Ambulatory Visit: Payer: BC Managed Care – PPO | Admitting: Family Medicine

## 2019-02-15 VITALS — BP 124/68 | HR 93

## 2019-02-15 DIAGNOSIS — Z79899 Other long term (current) drug therapy: Secondary | ICD-10-CM

## 2019-02-15 DIAGNOSIS — G8929 Other chronic pain: Secondary | ICD-10-CM | POA: Diagnosis not present

## 2019-02-15 DIAGNOSIS — M542 Cervicalgia: Secondary | ICD-10-CM | POA: Diagnosis not present

## 2019-02-15 MED ORDER — HYDROCODONE-ACETAMINOPHEN 10-325 MG PO TABS: 1.0000 | ORAL_TABLET | Freq: Two times a day (BID) | ORAL | 0 refills | Status: DC

## 2019-02-15 MED ORDER — HYDROCODONE-ACETAMINOPHEN 10-325 MG PO TABS: 1.0000 | ORAL_TABLET | Freq: Two times a day (BID) | ORAL | 0 refills | Status: DC | PRN

## 2019-02-15 NOTE — Progress Notes (Signed)
error 

## 2019-02-15 NOTE — Patient Instructions (Signed)
Refilled pain medication for 3 months Come back sooner if back pain worsens Follow up in 3 mos

## 2019-02-15 NOTE — Progress Notes (Signed)
Date of Visit: 02/15/2019   HPI: Sandra Briggs is a 43 y.o. woman who presents for chronic opioid management related to cervicalgia and  Pseudogout. She reports that her pain has been well controlled on the prescribed dose of hydrocodone-acetaminophen 10-325 bid. She reports that she does not run out of medication and only rarely will take an extra dose over the course of a day. If she takes an extra dose on one day, she'll take less another day. She endorses some jitteriness related to the medication but says it is tolerable, and denies drowsiness, constipation, or falls. She keeps her opioids in a lockbox at home. She reports a new onset lower back pain but says that neurology told her to expect this given previous MRI findings. She denies radiculopathy, urinary or fecal incontinence, and saddle anesthesia.   Able to function and carry out her daily duties as a result of being on the medication. Last dose was this AM, also took it yesterday.  ROS: See HPI.  [place]: She has taken temporary custody of her sister's newborn. She expresses that having the child at home is a greater source of joy than stress at this time in her life.   PHYSICAL EXAM: BP 124/68   Pulse 93   SpO2 97%  Gen: Sandra Briggs is well-appearing and conversational. In no apparent distress.  Extremities: grip 4/5 on L and 5/5 on R, consistent with prior exams. Full strength bilateral lower extremities  Psych: normal range of affect, well groomed, speech normal in rate and volume, normal eye contact   ASSESSMENT/PLAN:  Encounter for chronic pain management PMP reviewed with appropriate findings Benefits continue to outweigh risks Refill medication for 3 months UDS today for monitoring   FOLLOW UP: Follow up in 3 mo for chronic opioid management.   [place] Student  Patient seen along with MS2 student [name]. I personally evaluated this patient along with the student, and verified all aspects of the history,  physical exam, and medical decision making as documented by the student. I agree with the student's documentation and have made all necessary edits.  Chrisandra Netters, MD [place]

## 2019-02-17 LAB — TOXASSURE SELECT 13 (MW), URINE

## 2019-02-18 NOTE — Assessment & Plan Note (Signed)
PMP reviewed with appropriate findings Benefits continue to outweigh risks Refill medication for 3 months UDS today for monitoring

## 2019-02-19 ENCOUNTER — Telehealth: Payer: Self-pay | Admitting: Family Medicine

## 2019-02-19 NOTE — Telephone Encounter (Signed)
Contacted patient about urine drug screen results, which unexpectedly were positive for fentanyl and a metabolite of fentanyl.  She was shocked to find this out. States she has never done fentanyl. Denies injecting drugs or using any patches. Says she has not used any medications other than what she is prescribed, and she doesn't even like having to take the hydrocodone. Has not had any recent medical procedures.  Patient was appropriate in giving this response. Discussed with her that we use the UDS as a tool for conversation and another piece of information to keep her safe while on chronic narcotic therapy. This may be a false positive. UDS was positive for hydrocodone, which was expected.  Patient offered to come Monday morning to give another urine sample. I advised we could do that if she wants to for her peace of mind, but that I am fine with just rechecking a UDS again when I see her 3 months. Patient has always been appropriate in the office, has never had unexpected drug screen results in the past, and the PMP is always appropriate when I review it.   Will recheck UDS at next visit. Patient appreciative. Leeanne Rio, MD

## 2019-04-27 ENCOUNTER — Other Ambulatory Visit: Payer: Self-pay

## 2019-04-27 ENCOUNTER — Ambulatory Visit: Payer: BC Managed Care – PPO | Admitting: Family Medicine

## 2019-04-27 ENCOUNTER — Encounter: Payer: Self-pay | Admitting: Family Medicine

## 2019-04-27 VITALS — BP 124/80 | HR 96

## 2019-04-27 DIAGNOSIS — Z23 Encounter for immunization: Secondary | ICD-10-CM

## 2019-04-27 DIAGNOSIS — Z79899 Other long term (current) drug therapy: Secondary | ICD-10-CM

## 2019-04-27 DIAGNOSIS — G8929 Other chronic pain: Secondary | ICD-10-CM

## 2019-04-27 NOTE — Progress Notes (Signed)
Date of Visit: 04/27/2019   HPI:  Sandra Briggs presents for follow up.  Chronic pain - currently taking norco 10-325mg  twice daily. Last visit urine was unexpectedly positive for fentanyl and a fentanyl metabolite. Patient maintains she did not use fentanyl or get near it. She uses her medication appropriately. Stores it safely in a lock box as she has teenagers. No adverse side effects. Last dose of norco was this AM, also took it yesterday. Able to function as a result of taking the medication, can tell if she doesn't take it. Pain located in neck and down L arm. Strength overall is stable. She is planning to follow up with neurosurgery.  Spots on skin - having some red bumps on her face and her chest for about the last month. Has red hair and freckles, fair skinned. Is careful about wearing sunscreen. Has not seen a dermatologist.  Would like flu shot today  ROS: See HPI.  Sandra Briggs: history of chronic neck pain, panic disorder  PHYSICAL EXAM: BP 124/80   Pulse 96   SpO2 96%  Gen: no acute distress, pleasant, cooperative HEENT: normocephalic, atraumatic  Lungs: normal work of breathing  Neuro: speech normal Ext: grip 4/5 on L, 5/5 on R (stable from prior). 5/5 with shoulder abduction & adduction bilaterally Skin: freckles throughout skin. Some small 1-53mm faint macules on R cheek and anterior chest wall.  ASSESSMENT/PLAN:  Health maintenance:  -flu shot given today  Encounter for chronic pain management PMP reviewed and appropriate. Last UDS unexpectedly positive for fentanyl. unclear if false positive or true substance abuse - patient maintains she has not used fentanyl and is believable. Repeat UDS today. Will await result before sending in any refills on norco.  Skin spots Given fair skin, freckles, red hair, light eyes with sun related changes, recommend patient be seen by dermatology. Gave her the name of a local office to call and schedule an appointment  FOLLOW UP: Follow up in  3 months for routine follow up  Schedule with dermatology  Sandra Briggs, Sandra Briggs

## 2019-04-27 NOTE — Patient Instructions (Signed)
It was great to see you again today!  Checking urine today Flu shot today  Call [facility] for a dermatology appointment  Once I get your urine test back I'll send in refills - will be in touch through [facility] or phone  Be well, Dr. [name]

## 2019-04-27 NOTE — Assessment & Plan Note (Signed)
PMP reviewed and appropriate. Last UDS unexpectedly positive for fentanyl. unclear if false positive or true substance abuse - patient maintains she has not used fentanyl and is believable. Repeat UDS today. Will await result before sending in any refills on norco.

## 2019-05-03 LAB — TOXASSURE SELECT 13 (MW), URINE

## 2019-05-04 ENCOUNTER — Encounter: Payer: Self-pay | Admitting: Family Medicine

## 2019-05-05 ENCOUNTER — Other Ambulatory Visit: Payer: Self-pay | Admitting: Family Medicine

## 2019-05-05 DIAGNOSIS — G8929 Other chronic pain: Secondary | ICD-10-CM

## 2019-05-05 MED ORDER — HYDROCODONE-ACETAMINOPHEN 10-325 MG PO TABS: 1.0000 | ORAL_TABLET | Freq: Two times a day (BID) | ORAL | 0 refills | Status: DC | PRN

## 2019-05-05 MED ORDER — HYDROCODONE-ACETAMINOPHEN 10-325 MG PO TABS: 1.0000 | ORAL_TABLET | Freq: Two times a day (BID) | ORAL | 0 refills | Status: DC

## 2019-06-16 ENCOUNTER — Encounter: Payer: Self-pay | Admitting: Family Medicine

## 2019-06-16 DIAGNOSIS — L578 Other skin changes due to chronic exposure to nonionizing radiation: Secondary | ICD-10-CM

## 2019-07-30 DIAGNOSIS — I781 Nevus, non-neoplastic: Secondary | ICD-10-CM | POA: Diagnosis not present

## 2019-07-30 DIAGNOSIS — L814 Other melanin hyperpigmentation: Secondary | ICD-10-CM | POA: Diagnosis not present

## 2019-07-30 DIAGNOSIS — D2371 Other benign neoplasm of skin of right lower limb, including hip: Secondary | ICD-10-CM | POA: Diagnosis not present

## 2019-07-30 DIAGNOSIS — L578 Other skin changes due to chronic exposure to nonionizing radiation: Secondary | ICD-10-CM | POA: Diagnosis not present

## 2019-08-03 ENCOUNTER — Ambulatory Visit (INDEPENDENT_AMBULATORY_CARE_PROVIDER_SITE_OTHER): Payer: BC Managed Care – PPO | Admitting: Family Medicine

## 2019-08-03 ENCOUNTER — Other Ambulatory Visit: Payer: Self-pay

## 2019-08-03 VITALS — BP 105/80 | HR 82 | Wt 164.2 lb

## 2019-08-03 DIAGNOSIS — M79642 Pain in left hand: Secondary | ICD-10-CM

## 2019-08-03 DIAGNOSIS — Z79899 Other long term (current) drug therapy: Secondary | ICD-10-CM | POA: Diagnosis not present

## 2019-08-03 DIAGNOSIS — G8929 Other chronic pain: Secondary | ICD-10-CM | POA: Diagnosis not present

## 2019-08-03 DIAGNOSIS — M79641 Pain in right hand: Secondary | ICD-10-CM

## 2019-08-03 DIAGNOSIS — M542 Cervicalgia: Secondary | ICD-10-CM

## 2019-08-03 MED ORDER — HYDROCODONE-ACETAMINOPHEN 10-325 MG PO TABS: 1.0000 | ORAL_TABLET | Freq: Two times a day (BID) | ORAL | 0 refills | Status: DC

## 2019-08-03 MED ORDER — HYDROCODONE-ACETAMINOPHEN 10-325 MG PO TABS: 1.0000 | ORAL_TABLET | Freq: Two times a day (BID) | ORAL | 0 refills | Status: DC | PRN

## 2019-08-03 NOTE — Patient Instructions (Signed)
It was great to see you again today!  Refilled medications Sending to rheumatology, someone will call with an appointment  Follow up in 3 months   Be well, Dr. [name]

## 2019-08-03 NOTE — Progress Notes (Signed)
Date of Visit: 08/03/2019   HPI:  Sandra Briggs presents for medication refill for chronic neck pain and new complaint of bilateral finger pain.   Chronic neck pain: Patient describes continued adherence to Norco twice daily in treating chronic neck pain. She regularly takes this medication in the morning and may take Norco or ibuprofen in the evening depending on her pain level. Denies increased sedation, agitation or changes in bowel movements. Notes continued adherence with gabapentin as well. Notes transient sedation for ~ 30 minutes following use of gabapentin. Asserts these feelings do not interfere with work or normal routine. Storing narcotics safely, keeps in locked box.  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 Pain is worse in the morning and increases with use requiring fine motor skills (opening door and buttoning shirt were patient's examples). Patient works in [place] but does not note overuse of hands/fingers as an issue.   ROS: See HPI.  [place]: history of panic disorder, bipolar, chronic neck pain,   PHYSICAL EXAM: BP 105/80   Pulse 82   Wt 164 lb 3.2 oz (74.5 kg)   SpO2 100%   BMI 28.18 kg/m  Gen: Well appearing female in no distress conversing appropriately  HEENT: normocephalic, anicteric, no thyromegaly Heart: regular rate and rhythm, normal s1 and s2, no murmurs, rubs or gallops Lungs: normal work of breathing, clear to auscultation bilaterally  Psych: normal affect, attentive, normal rate and fluency of speech Ext: 5/5 grip strength R, 4/5 on L (consistent with prior), tenderness to passive movement of DIP joint, mild swelling of DIPs bilaterally  ASSESSMENT/PLAN:  Health maintenance:  - up to date  Hand pain Strong family history of rheumatoid arthritis. Previous normal workup here. Will refer to rheumatology for further  eval.  Encounter for chronic pain management Pain stable. [place] PMP reviewed with appropriate findings. Repeat UDS today. Benefits continue to outweigh risks of continued therapy Will refill norco x3 months.   FOLLOW UP: Follow up in 3 months for medication refill.   Maudry Diego, [facility]  Patient seen along with MS3 student Maudry Diego. I personally evaluated this patient along with the student, and verified all aspects of the history, physical exam, and medical decision making as documented by the student. I agree with the student's documentation and have made all necessary edits.  Chrisandra Netters, MD [place]

## 2019-08-06 NOTE — Assessment & Plan Note (Signed)
Strong family history of rheumatoid arthritis. Previous normal workup here. Will refer to rheumatology for further eval.

## 2019-08-06 NOTE — Assessment & Plan Note (Signed)
Pain stable. [place] PMP reviewed with appropriate findings. Repeat UDS today. Benefits continue to outweigh risks of continued therapy Will refill norco x3 months.

## 2019-08-08 ENCOUNTER — Encounter: Payer: Self-pay | Admitting: Family Medicine

## 2019-08-08 LAB — TOXASSURE SELECT 13 (MW), URINE

## 2019-08-16 ENCOUNTER — Encounter: Payer: Self-pay | Admitting: Family Medicine

## 2019-08-16 DIAGNOSIS — Z20828 Contact with and (suspected) exposure to other viral communicable diseases: Secondary | ICD-10-CM | POA: Diagnosis not present

## 2019-10-14 ENCOUNTER — Telehealth: Payer: Self-pay

## 2019-10-14 NOTE — Telephone Encounter (Signed)
Called patient and LVM to call and schedule an appointment.  Sandra Briggs, [place]

## 2019-11-18 ENCOUNTER — Other Ambulatory Visit: Payer: Self-pay

## 2019-11-18 ENCOUNTER — Ambulatory Visit: Payer: BC Managed Care – PPO | Admitting: Family Medicine

## 2019-11-18 VITALS — BP 130/80 | HR 138 | Ht 64.0 in | Wt 169.6 lb

## 2019-11-18 DIAGNOSIS — Z79899 Other long term (current) drug therapy: Secondary | ICD-10-CM | POA: Diagnosis not present

## 2019-11-18 DIAGNOSIS — G8929 Other chronic pain: Secondary | ICD-10-CM

## 2019-11-18 NOTE — Patient Instructions (Signed)
Will wait for results of urine test before refilling medication  Call with any questions  Be well, Dr. [name]

## 2019-11-18 NOTE — Progress Notes (Signed)
  Date of Visit: 11/18/2019   SUBJECTIVE:   HPI:  Sandra Briggs presents today for routine follow up.  Chronic pain - has been prescribed norco 10-325mg  twice daily. Last fill 10/03/19 (reviewed PDMP and confirmed with pharmacy). Last UDS in December was positive for oxycodone and metabolites, and negative for hydrocodone. Patient adamantly denies that she took any medications not prescribed to her, states she does not know where to even obtain other controlled substances. Denies difficulty managing how often she is taking her medication. Denies excessive sedation from the medication. She has been out of it since about March 20 and has done okay without it, though pain has been higher and made life more difficult. A few days ago had a particularly bad bout of pain in neck and hands.   OBJECTIVE:   BP 130/80   Pulse (!) 138   Ht 5\' 4"  (1.626 m)   Wt 169 lb 9.6 oz (76.9 kg)   SpO2 100%   BMI 29.11 kg/m   Repeat heart rate 110 Gen: no acute distress, pleasant, cooperative HEENT: normocephalic, atraumatic. Full ROM of neck with extension & flexion Heart: tachycardic, regular rhythm, no murmur Lungs: clear to auscultation bilaterally, normal work of breathing  Neuro: grip 5/5 on R, 4/5 on L (stable from prior) Psych: normal range of affect, well groomed, speech normal in rate and volume, normal eye contact   ASSESSMENT/PLAN:   Health maintenance:  -discussed mammogram option of beginning now or waiting until age 76, patient elects to get mammogram, ordered & given handout on how to schedule  Encounter for chronic pain management Now with two UDS results that are unexpectedly positive for controlled substances not prescribed to patient. Patient adamant she has not taken other substances and denies having any substance use disorders. She has historically been very compliant and reliable. Will check UDS today, anticipate it should be negative for all controlled substances. Will hold on  refilling medication until today's UDS has resulted. Patient agreeable to this plan.  Tachycardia noted - heart rate 138 initially, lowered to 110 after patient rested and relaxed. Likely related to stress about today's visit.  Follow up pending today's UDS result.  [place]. [name], [place]

## 2019-11-18 NOTE — Assessment & Plan Note (Addendum)
Now with two UDS results that are unexpectedly positive for controlled substances not prescribed to patient. Patient adamant she has not taken other substances and denies having any substance use disorders. She has historically been very compliant and reliable. Will check UDS today, anticipate it should be negative for all controlled substances. Will hold on refilling medication until today's UDS has resulted. Patient agreeable to this plan.

## 2019-11-23 LAB — DRUG SCR UR, PAIN MGMT, REFLEX CONF
Amphetamine Scrn, Ur: NEGATIVE ng/mL
BARBITURATE SCREEN URINE: NEGATIVE ng/mL
BENZODIAZEPINE SCREEN, URINE: NEGATIVE ng/mL
Cocaine (Metab) Scrn, Ur: NEGATIVE ng/mL
Creatinine(Crt), U: 123.4 mg/dL (ref 20.0–300.0)
Methadone Screen, Urine: NEGATIVE ng/mL
OXYCODONE+OXYMORPHONE UR QL SCN: NEGATIVE ng/mL
Opiate Scrn, Ur: NEGATIVE ng/mL
Ph of Urine: 8.1 (ref 4.5–8.9)
Phencyclidine Qn, Ur: NEGATIVE ng/mL
Propoxyphene Scrn, Ur: NEGATIVE ng/mL

## 2019-11-26 ENCOUNTER — Encounter: Payer: Self-pay | Admitting: Family Medicine

## 2019-11-26 DIAGNOSIS — M542 Cervicalgia: Secondary | ICD-10-CM

## 2019-11-26 DIAGNOSIS — G8929 Other chronic pain: Secondary | ICD-10-CM

## 2019-11-29 MED ORDER — GABAPENTIN 300 MG PO CAPS: 300.0000 mg | ORAL_CAPSULE | Freq: Two times a day (BID) | ORAL | 3 refills | Status: DC

## 2019-12-01 ENCOUNTER — Telehealth: Payer: Self-pay

## 2019-12-01 MED ORDER — HYDROCODONE-ACETAMINOPHEN 10-325 MG PO TABS: 1.0000 | ORAL_TABLET | Freq: Two times a day (BID) | ORAL | 0 refills | Status: DC

## 2019-12-01 NOTE — Telephone Encounter (Signed)
Patient calls nurse line stating her insurance will only approved 7 days worth of Norco 10-325mg . I submitted a PA for Norco 10-325mg  via covermymeds for original 30 day supply. Determination could take up to 72 hours.  Key: [id number]

## 2019-12-01 NOTE — Telephone Encounter (Signed)
Late entry - spoke with patient 4/13. Patient continues to deny using any substances not prescribed to her, and quite frankly, her denial is convincing and appears earnest. As her UDS was appropriately negative, will cautiously resume rx of norco though with shorter interval for follow up (2 months rather than 3). Rx for 2 months sent in to her pharmacy. If any future aberrancies in UDS patient is aware that we will likely need to cease narcotic therapy for her own safety and wellbeing. Patient agreeable and appreciative.  Leeanne Rio, MD

## 2019-12-03 DIAGNOSIS — Z03818 Encounter for observation for suspected exposure to other biological agents ruled out: Secondary | ICD-10-CM | POA: Diagnosis not present

## 2019-12-03 DIAGNOSIS — Z20828 Contact with and (suspected) exposure to other viral communicable diseases: Secondary | ICD-10-CM | POA: Diagnosis not present

## 2019-12-06 DIAGNOSIS — Z20828 Contact with and (suspected) exposure to other viral communicable diseases: Secondary | ICD-10-CM | POA: Diagnosis not present

## 2019-12-06 DIAGNOSIS — Z03818 Encounter for observation for suspected exposure to other biological agents ruled out: Secondary | ICD-10-CM | POA: Diagnosis not present

## 2019-12-06 NOTE — Telephone Encounter (Signed)
Patient calls nurse line requesting update on PA for Norco.   Talbot Grumbling, RN

## 2019-12-06 NOTE — Telephone Encounter (Signed)
PA for Norco approved through 06/01/2020 [id number]. I called the pharmacy to inform, however the medication is still not going through, due to high dose. Pharmacy stated they would reach out to the insurance company with the information I gave them. VM left on patients machine informing of updates.

## 2020-01-10 NOTE — Progress Notes (Signed)
  Date of Visit: 01/11/2020   SUBJECTIVE:   HPI:  Sandra Briggs presents today for medication refill  Chronic pain - taking norco 10-325mg  twice daily. Tolerating well. Denies using other medications or substances. Last dose this AM, also took yesterday. Taking gabapentin 300mg  twice daily, no sedation with this. Pain regimen helps her be functional. Has had increased pain in hands lately, is on waitlist for appointment with rheumatology.  COVID vaccine - has not yet gotten but agreeable to getting it  OBJECTIVE:   BP 123/80   Pulse 83   Ht 5\' 4"  (1.626 m)   Wt 172 lb 9.6 oz (78.3 kg)   SpO2 98%   BMI 29.63 kg/m  Gen: no acute distress, pleasant cooperative HEENT: normocephalic, atraumatic. Full ROM of neck Lungs: normal work of breathing  Neuro: speech normal, grossly nonfocal Ext: grip 4/5 L, 5/5 R (baseline). Full strength with shoulder abduction  & adduction bilaterally  ASSESSMENT/PLAN:   Health maintenance:  -encouraged to get COVID vaccine, gave info on vaccine clinics  Encounter for chronic pain management Increase gabapentin to 300mg  three times daily as patient feels benefit from this Check UDS today, anticipate positive for hydrocodone, no other controlled substances Once resulted will address refill of norco Anticipate follow up in 3 months   FOLLOW UP: Follow up in 3 months for chronic pain  [name], [place]

## 2020-01-11 ENCOUNTER — Other Ambulatory Visit: Payer: Self-pay

## 2020-01-11 ENCOUNTER — Ambulatory Visit: Payer: BC Managed Care – PPO | Admitting: Family Medicine

## 2020-01-11 VITALS — BP 123/80 | HR 83 | Ht 64.0 in | Wt 172.6 lb

## 2020-01-11 DIAGNOSIS — Z79899 Other long term (current) drug therapy: Secondary | ICD-10-CM

## 2020-01-11 DIAGNOSIS — M542 Cervicalgia: Secondary | ICD-10-CM | POA: Diagnosis not present

## 2020-01-11 DIAGNOSIS — G8929 Other chronic pain: Secondary | ICD-10-CM | POA: Diagnosis not present

## 2020-01-11 MED ORDER — GABAPENTIN 300 MG PO CAPS: 300.0000 mg | ORAL_CAPSULE | Freq: Three times a day (TID) | ORAL | 1 refills | Status: DC

## 2020-01-11 NOTE — Patient Instructions (Addendum)
Take gabapentin three times daily - sent in prescription Urine test today, will refill once that result is back  Vaccine Clinic Info: [address] [place], [place] [address] Hours: Mon,Thu 8-5, Sat 8-12 Type: Pfizer (12+) [phone] More info at [email or web address]   Be well, Dr. [name]

## 2020-01-11 NOTE — Assessment & Plan Note (Signed)
Increase gabapentin to 300mg  three times daily as patient feels benefit from this Check UDS today, anticipate positive for hydrocodone, no other controlled substances Once resulted will address refill of norco Anticipate follow up in 3 months

## 2020-01-14 LAB — DRUG SCR UR, PAIN MGMT, REFLEX CONF
Amphetamine Scrn, Ur: NEGATIVE ng/mL
BARBITURATE SCREEN URINE: NEGATIVE ng/mL
BENZODIAZEPINE SCREEN, URINE: NEGATIVE ng/mL
Cocaine (Metab) Scrn, Ur: NEGATIVE ng/mL
Creatinine(Crt), U: 48.2 mg/dL (ref 20.0–300.0)
Methadone Screen, Urine: NEGATIVE ng/mL
OXYCODONE+OXYMORPHONE UR QL SCN: NEGATIVE ng/mL
Opiate Scrn, Ur: POSITIVE ng/mL — AB
Ph of Urine: 5.9 (ref 4.5–8.9)
Phencyclidine Qn, Ur: NEGATIVE ng/mL
Propoxyphene Scrn, Ur: NEGATIVE ng/mL

## 2020-01-14 MED ORDER — HYDROCODONE-ACETAMINOPHEN 10-325 MG PO TABS: 1.0000 | ORAL_TABLET | Freq: Two times a day (BID) | ORAL | 0 refills | Status: DC | PRN

## 2020-01-14 MED ORDER — HYDROCODONE-ACETAMINOPHEN 10-325 MG PO TABS: 1.0000 | ORAL_TABLET | Freq: Two times a day (BID) | ORAL | 0 refills | Status: DC

## 2020-01-14 NOTE — Addendum Note (Signed)
Addended by: Leeanne Rio on: 01/14/2020 03:49 PM   Modules accepted: Orders

## 2020-02-03 ENCOUNTER — Encounter: Payer: Self-pay | Admitting: Family Medicine

## 2020-03-21 ENCOUNTER — Encounter: Payer: Self-pay | Admitting: Family Medicine

## 2020-03-22 MED ORDER — DOXYCYCLINE HYCLATE 100 MG PO TABS
100.0000 mg | ORAL_TABLET | Freq: Two times a day (BID) | ORAL | 0 refills | Status: DC
Start: 2020-03-22 — End: 2020-04-20

## 2020-03-22 NOTE — Telephone Encounter (Signed)
Called patient to discuss. Has headache and diarrhea, no fevers. No rash. Eating and drinking well.  Discussed that it is difficult to tell whether this is tickborne illness but very reasonable to treat for it as such since she has the known tick bites and is not feeling well. Will rx doxycycline 100mg  twice daily for 1 week. Advised patient to contact us if not feeling better in a few days, or sooner if she gets worse. Advised to use sunscreen due to potential for photosensitivity with doxycycline. Patient agreeable & appreciative.  Leeanne Rio, MD

## 2020-04-20 ENCOUNTER — Ambulatory Visit (INDEPENDENT_AMBULATORY_CARE_PROVIDER_SITE_OTHER): Payer: BC Managed Care – PPO | Admitting: Family Medicine

## 2020-04-20 ENCOUNTER — Other Ambulatory Visit: Payer: Self-pay | Admitting: Family Medicine

## 2020-04-20 ENCOUNTER — Encounter: Payer: Self-pay | Admitting: Family Medicine

## 2020-04-20 ENCOUNTER — Other Ambulatory Visit: Payer: Self-pay

## 2020-04-20 VITALS — BP 128/80 | HR 92 | Ht 64.0 in | Wt 176.2 lb

## 2020-04-20 DIAGNOSIS — Z1159 Encounter for screening for other viral diseases: Secondary | ICD-10-CM

## 2020-04-20 DIAGNOSIS — Z13220 Encounter for screening for lipoid disorders: Secondary | ICD-10-CM

## 2020-04-20 DIAGNOSIS — Z79899 Other long term (current) drug therapy: Secondary | ICD-10-CM

## 2020-04-20 DIAGNOSIS — J302 Other seasonal allergic rhinitis: Secondary | ICD-10-CM | POA: Diagnosis not present

## 2020-04-20 DIAGNOSIS — Z23 Encounter for immunization: Secondary | ICD-10-CM

## 2020-04-20 DIAGNOSIS — G8929 Other chronic pain: Secondary | ICD-10-CM | POA: Diagnosis not present

## 2020-04-20 DIAGNOSIS — J309 Allergic rhinitis, unspecified: Secondary | ICD-10-CM

## 2020-04-20 NOTE — Assessment & Plan Note (Signed)
Pain stable. Benefits of therapy continue to outweigh risks. UDS today. If appropriate will refill x3 months.

## 2020-04-20 NOTE — Patient Instructions (Signed)
Checking labs today Second COVID shot today I will refill medications once your urine comes back  Follow up in 3 months, sooner if needed.  Referring to allergist  Be well, Dr. [name]

## 2020-04-20 NOTE — Progress Notes (Signed)
°  Date of Visit: 04/20/2020   SUBJECTIVE:   HPI:  Sandra Briggs presents today for medication refill.  Chronic neck pain - taking norco 10-325mg  twice daily. Tolerating well without sedation. Also takes gabapentin 300mg  three times daily. Doing well with this. Storing medication safely, in a locked box. Denies using any other substances. Last dose of norco this AM.  Allergies - has lots of allergies when around dust or being outside, limiting her activities because her itchy eyes and runny nose are so severe. Takes allegra with some help. Has noticed things getting worse as she gets older. Would like to see allergist  Needs second pfizer COVID vaccine, due to scheduling issues never went back for her second dose.  OBJECTIVE:   BP 128/80    Pulse 92    Ht 5\' 4"  (1.626 m)    Wt 176 lb 3.2 oz (79.9 kg)    SpO2 95%    BMI 30.24 kg/m  Gen: no acute distress, pleasant, cooperative HEENT: normocephalic, atraumatic. Posterior neck musculature mildly tender to palpation  Lungs: normal work of breathing   Neuro: grip 4/5 on L, 5/5 on R, consistent with baseline.  ASSESSMENT/PLAN:   Health maintenance:  -hep C screening test today -lipid panel & CMET today for screening -second COVID vaccine given today  Allergic rhinitis Significant symptoms, would benefit likely from allergy testing Referral entered to allergist  Encounter for chronic pain management Pain stable. Benefits of therapy continue to outweigh risks. UDS today. If appropriate will refill x3 months.   FOLLOW UP: Follow up in 3 mos for chronic pain  [name], [place]

## 2020-04-20 NOTE — Assessment & Plan Note (Signed)
Significant symptoms, would benefit likely from allergy testing Referral entered to allergist

## 2020-04-21 LAB — CMP14+EGFR
ALT: 23 IU/L (ref 0–32)
AST: 21 IU/L (ref 0–40)
Albumin/Globulin Ratio: 1.8 (ref 1.2–2.2)
Albumin: 4.7 g/dL (ref 3.8–4.8)
Alkaline Phosphatase: 76 IU/L (ref 48–121)
BUN/Creatinine Ratio: 17 (ref 9–23)
BUN: 13 mg/dL (ref 6–24)
Bilirubin Total: 0.3 mg/dL (ref 0.0–1.2)
CO2: 25 mmol/L (ref 20–29)
Calcium: 9.3 mg/dL (ref 8.7–10.2)
Chloride: 102 mmol/L (ref 96–106)
Creatinine, Ser: 0.75 mg/dL (ref 0.57–1.00)
GFR calc Af Amer: 113 mL/min/{1.73_m2} (ref >59)
GFR calc non Af Amer: 98 mL/min/{1.73_m2} (ref >59)
Globulin, Total: 2.6 g/dL (ref 1.5–4.5)
Glucose: 74 mg/dL (ref 65–99)
Potassium: 4.1 mmol/L (ref 3.5–5.2)
Sodium: 138 mmol/L (ref 134–144)
Total Protein: 7.3 g/dL (ref 6.0–8.5)

## 2020-04-21 LAB — LIPID PANEL
Chol/HDL Ratio: 6.2 ratio — ABNORMAL HIGH (ref 0.0–4.4)
Cholesterol, Total: 237 mg/dL — ABNORMAL HIGH (ref 100–199)
HDL: 38 mg/dL — ABNORMAL LOW (ref >39)
LDL Chol Calc (NIH): 181 mg/dL — ABNORMAL HIGH (ref 0–99)
Triglycerides: 102 mg/dL (ref 0–149)
VLDL Cholesterol Cal: 18 mg/dL (ref 5–40)

## 2020-04-21 NOTE — Addendum Note (Signed)
Addended by: [place] Pink on: 04/21/2020 02:03 PM   Modules accepted: Orders

## 2020-04-22 LAB — TOXASSURE SELECT 13 (MW), URINE

## 2020-04-22 LAB — SPECIMEN STATUS REPORT

## 2020-04-22 LAB — HEPATITIS C ANTIBODY: Hep C Virus Ab: <0.1 s/co ratio (ref 0.0–0.9)

## 2020-04-24 ENCOUNTER — Encounter: Payer: Self-pay | Admitting: Family Medicine

## 2020-04-24 DIAGNOSIS — M542 Cervicalgia: Secondary | ICD-10-CM

## 2020-04-28 MED ORDER — HYDROCODONE-ACETAMINOPHEN 10-325 MG PO TABS: 1.0000 | ORAL_TABLET | Freq: Two times a day (BID) | ORAL | 0 refills | Status: DC | PRN

## 2020-04-28 MED ORDER — HYDROCODONE-ACETAMINOPHEN 10-325 MG PO TABS: 1.0000 | ORAL_TABLET | Freq: Two times a day (BID) | ORAL | 0 refills | Status: DC

## 2020-05-30 DIAGNOSIS — S0993XA Unspecified injury of face, initial encounter: Secondary | ICD-10-CM | POA: Diagnosis not present

## 2020-05-30 DIAGNOSIS — J0100 Acute maxillary sinusitis, unspecified: Secondary | ICD-10-CM | POA: Diagnosis not present

## 2020-05-30 DIAGNOSIS — X58XXXA Exposure to other specified factors, initial encounter: Secondary | ICD-10-CM | POA: Diagnosis not present

## 2020-05-30 DIAGNOSIS — S025XXA Fracture of tooth (traumatic), initial encounter for closed fracture: Secondary | ICD-10-CM | POA: Diagnosis not present

## 2020-05-30 DIAGNOSIS — Z87891 Personal history of nicotine dependence: Secondary | ICD-10-CM | POA: Diagnosis not present

## 2020-05-31 ENCOUNTER — Encounter (HOSPITAL_COMMUNITY): Payer: Self-pay | Admitting: Emergency Medicine

## 2020-05-31 ENCOUNTER — Emergency Department (HOSPITAL_COMMUNITY)
Admission: EM | Admit: 2020-05-31 | Discharge: 2020-05-31 | Disposition: A | Discharge status: Home / Self Care | Payer: BC Managed Care – PPO | Attending: Emergency Medicine | Admitting: Emergency Medicine

## 2020-05-31 ENCOUNTER — Other Ambulatory Visit: Payer: Self-pay

## 2020-05-31 DIAGNOSIS — J0100 Acute maxillary sinusitis, unspecified: Secondary | ICD-10-CM

## 2020-05-31 DIAGNOSIS — S025XXA Fracture of tooth (traumatic), initial encounter for closed fracture: Secondary | ICD-10-CM

## 2020-05-31 LAB — BASIC METABOLIC PANEL
Anion gap: 9 (ref 5–15)
BUN: 11 mg/dL (ref 6–20)
CO2: 23 mmol/L (ref 22–32)
Calcium: 8.8 mg/dL — ABNORMAL LOW (ref 8.9–10.3)
Chloride: 105 mmol/L (ref 98–111)
Creatinine, Ser: 0.7 mg/dL (ref 0.44–1.00)
GFR, Estimated: >60 mL/min (ref >60)
Glucose, Bld: 133 mg/dL — ABNORMAL HIGH (ref 70–99)
Potassium: 3.6 mmol/L (ref 3.5–5.1)
Sodium: 137 mmol/L (ref 135–145)

## 2020-05-31 LAB — CBC
HCT: 38.4 % (ref 36.0–46.0)
Hemoglobin: 12.7 g/dL (ref 12.0–15.0)
MCH: 31.8 pg (ref 26.0–34.0)
MCHC: 33.1 g/dL (ref 30.0–36.0)
MCV: 96 fL (ref 80.0–100.0)
Platelets: 206 10*3/uL (ref 150–400)
RBC: 4 MIL/uL (ref 3.87–5.11)
RDW: 11.3 % — ABNORMAL LOW (ref 11.5–15.5)
WBC: 12.6 10*3/uL — ABNORMAL HIGH (ref 4.0–10.5)
nRBC: 0 % (ref 0.0–0.2)

## 2020-05-31 MED ORDER — ACETAMINOPHEN 500 MG PO TABS
1000.0000 mg | ORAL_TABLET | Freq: Once | ORAL | Status: AC
  Administered 2020-05-31: 1000 mg via ORAL
  Filled 2020-05-31: qty 2

## 2020-05-31 MED ORDER — AMOXICILLIN-POT CLAVULANATE 875-125 MG PO TABS
1.0000 | ORAL_TABLET | Freq: Once | ORAL | Status: AC
  Administered 2020-05-31: 1 via ORAL
  Filled 2020-05-31: qty 1

## 2020-05-31 MED ORDER — AMOXICILLIN-POT CLAVULANATE 875-125 MG PO TABS: 1.0000 | ORAL_TABLET | Freq: Two times a day (BID) | ORAL | 0 refills | Status: DC

## 2020-05-31 MED ORDER — OXYCODONE HCL 5 MG PO TABS
5.0000 mg | ORAL_TABLET | Freq: Once | ORAL | Status: AC
  Administered 2020-05-31: 5 mg via ORAL
  Filled 2020-05-31: qty 1

## 2020-05-31 NOTE — Discharge Instructions (Signed)
Take 4 over the counter ibuprofen tablets 3 times a day or 2 over-the-counter naproxen tablets twice a day for pain. Also take tylenol 1000mg(2 extra strength) four times a day.    

## 2020-05-31 NOTE — ED Provider Notes (Signed)
[facility] EMERGENCY DEPARTMENT Provider Note   CSN: [id number] Arrival date & time: 05/30/20  2335     History Chief Complaint  Patient presents with  . Facial Swelling    Sandra Briggs is a 44 y.o. female.  44 yo F with a chief complaints of left-sided facial swelling.  This is over her left cheek.  Going on for the past day.  She has fractured teeth on that side but is not really been bothering her.  She denies cough congestion or fever.  She denied sinus pressure initially and then stated that when she flew from [place] yesterday she noticed some pain to the sinuses.  Denies fevers.  Denies ear pain.  The history is provided by the patient.  Illness Severity:  Moderate Onset quality:  Gradual Duration:  1 day Timing:  Constant Progression:  Worsening Chronicity:  New Associated symptoms: no chest pain, no congestion, no fever, no headaches, no myalgias, no nausea, no rhinorrhea, no shortness of breath, no vomiting and no wheezing        Past Medical History:  Diagnosis Date  . Anxiety   . Chronic pain    flank  . Complication of anesthesia   . Dysuria-frequency syndrome   . Exercise-induced asthma   . Panic disorder   . Sprain of MCL (medial collateral ligament) of knee     Patient Active Problem List   Diagnosis Date Noted  . Rash and nonspecific skin eruption 11/19/2017  . Postcoital bleeding 08/18/2017  . Hand pain 08/18/2017  . Encounter for chronic pain management 09/04/2014  . Shortness of breath 03/04/2014  . Pseudogout 12/30/2013  . Overweight (BMI 25.0-29.9) 02/27/2012  . Cervicalgia 09/07/2010  . Bipolar disorder ([place]) 07/02/2007  . PANIC DISORDER 06/08/2007  . Allergic rhinitis 10/16/2006    Past Surgical History:  Procedure Laterality Date  . APPENDECTOMY    . CESAREAN SECTION    . CRYOABLATION N/A   . ENDOMETRIAL ABLATION  2009  . TUBAL LIGATION  06/28/2005     OB History    Gravida  6   Para  3   Term  3    Preterm  0   AB  3   Living  3     SAB  3   TAB  0   Ectopic  0   Multiple  0   Live Births  3           Family History  Problem Relation Age of Onset  . Lung cancer Mother   . Fibroids Sister   . Hyperlipidemia Maternal Grandmother     Social History   Tobacco Use  . Smoking status: Former Smoker    Types: Cigarettes    Quit date: 02/27/2011    Years since quitting: 9.2  . Smokeless tobacco: Former Systems developer    Quit date: 10/22/2010  Substance Use Topics  . Alcohol use: Yes    Comment: rare  . Drug use: No    Home Medications Prior to Admission medications   Medication Sig Start Date End Date Taking? Authorizing Provider  amoxicillin-clavulanate (AUGMENTIN) 875-125 MG tablet Take 1 tablet by mouth 2 (two) times daily. One po bid x 7 days 05/31/20   Deno Etienne, DO  fexofenadine-pseudoephedrine (ALLEGRA-D 24) 180-240 MG 24 hr tablet Take 1 tablet by mouth daily.    [provider]  gabapentin (NEURONTIN) 300 MG capsule Take 1 capsule (300 mg total) by mouth 3 (three) times daily. 01/11/20  Leeanne Rio, MD  HYDROcodone-acetaminophen [facility]) 10-325 MG tablet Take 1 tablet by mouth every 12 (twelve) hours as needed for severe pain. 04/28/20   Leeanne Rio, MD  HYDROcodone-acetaminophen (NORCO) 10-325 MG tablet Take 1 tablet by mouth every 12 (twelve) hours. 04/28/20   Leeanne Rio, MD  HYDROcodone-acetaminophen (NORCO) 10-325 MG tablet Take 1 tablet by mouth every 12 (twelve) hours. 04/28/20   Leeanne Rio, MD  ibuprofen (ADVIL,MOTRIN) 200 MG tablet Take 400 mg by mouth 2 (two) times daily as needed.    [provider]    Allergies    Bee venom, Sulfonamide derivatives, and Tramadol hcl  Review of Systems   Review of Systems  Constitutional: Negative for chills and fever.  HENT: Positive for sinus pressure and sinus pain. Negative for congestion and rhinorrhea.   Eyes: Negative for redness and visual disturbance.   Respiratory: Negative for shortness of breath and wheezing.   Cardiovascular: Negative for chest pain and palpitations.  Gastrointestinal: Negative for nausea and vomiting.  Genitourinary: Negative for dysuria and urgency.  Musculoskeletal: Negative for arthralgias and myalgias.  Skin: Negative for pallor and wound.  Neurological: Negative for dizziness and headaches.    Physical Exam Updated Vital Signs BP 136/80 (BP Location: Left Arm)   Pulse 78   Temp 98.6 F (37 C) (Oral)   Resp 16   SpO2 99%   Physical Exam Vitals and nursing note reviewed.  Constitutional:      General: She is not in acute distress.    Appearance: She is well-developed. She is not diaphoretic.  HENT:     Head: Normocephalic and atraumatic.     Comments: Swollen turbinates, posterior nasal drip, left maxillary sinus is exquisitely tender to percussion., tm normal bilaterally.   Fractured left lateral incisor and left posterior molar.  No significant tenderness along the fractured tooth.  No obvious fluctuance or induration about the gumline.  Eyes:     Pupils: Pupils are equal, round, and reactive to light.  Cardiovascular:     Rate and Rhythm: Normal rate and regular rhythm.     Heart sounds: No murmur heard.  No friction rub. No gallop.   Pulmonary:     Effort: Pulmonary effort is normal.     Breath sounds: No wheezing or rales.  Abdominal:     General: There is no distension.     Palpations: Abdomen is soft.     Tenderness: There is no abdominal tenderness.  Musculoskeletal:        General: No tenderness.     Cervical back: Normal range of motion and neck supple.  Skin:    General: Skin is warm and dry.  Neurological:     Mental Status: She is alert and oriented to person, place, and time.  Psychiatric:        Behavior: Behavior normal.     ED Results / Procedures / Treatments   Labs (all labs ordered are listed, but only abnormal results are displayed) Labs Reviewed  CBC -  Abnormal; Notable for the following components:      Result Value   WBC 12.6 (*)    RDW 11.3 (*)    All other components within normal limits  BASIC METABOLIC PANEL - Abnormal; Notable for the following components:   Glucose, Bld 133 (*)    Calcium 8.8 (*)    All other components within normal limits    EKG None  Radiology No results found.  Procedures Procedures (  including critical care time)  Medications Ordered in ED Medications  amoxicillin-clavulanate (AUGMENTIN) 875-125 MG per tablet 1 tablet (has no administration in time range)  acetaminophen (TYLENOL) tablet 1,000 mg (has no administration in time range)  oxyCODONE (Oxy IR/ROXICODONE) immediate release tablet 5 mg (has no administration in time range)    ED Course  I have reviewed the triage vital signs and the nursing notes.  Pertinent labs & imaging results that were available during my care of the patient were reviewed by me and considered in my medical decision making (see chart for details).    MDM Rules/Calculators/A&P                          44 yo F with a chief complaints of left-sided facial swelling.  She does have some poor dentition on the left side but no obvious dental pain.  Seems more likely to be left maxillary sinusitis.  Will start on antibiotics.  We will have her follow-up with a dentist.  PCP follow-up as well.  9:31 AM:  I have discussed the diagnosis/risks/treatment options with the patient and believe the pt to be eligible for discharge home to follow-up with PCP, Dentist. We also discussed returning to the ED immediately if new or worsening sx occur. We discussed the sx which are most concerning (e.g., sudden worsening pain, fever, inability to tolerate by mouth) that necessitate immediate return. Medications administered to the patient during their visit and any new prescriptions provided to the patient are listed below.  Medications given during this visit Medications   amoxicillin-clavulanate (AUGMENTIN) 875-125 MG per tablet 1 tablet (has no administration in time range)  acetaminophen (TYLENOL) tablet 1,000 mg (has no administration in time range)  oxyCODONE (Oxy IR/ROXICODONE) immediate release tablet 5 mg (has no administration in time range)     The patient appears reasonably screen and/or stabilized for discharge and I doubt any other medical condition or other [facility] requiring further screening, evaluation, or treatment in the ED at this time prior to discharge.   Final Clinical Impression(s) / ED Diagnoses Final diagnoses:  Acute maxillary sinusitis, recurrence not specified  Closed fracture of tooth, initial encounter    Rx / DC Orders ED Discharge Orders         Ordered    amoxicillin-clavulanate (AUGMENTIN) 875-125 MG tablet  2 times daily        05/31/20 [place], [place], DO 05/31/20 [phone]

## 2020-05-31 NOTE — ED Triage Notes (Signed)
Pt presents to ED POV. Pt c/o L facial swelling. Pt reports that she broke tooth on L side. Pt reports she took beandryl at home w/ no relief, just has seasonal allergies. Pt states denies tooth pain, difficulty breathing. Airway intact.

## 2020-06-07 ENCOUNTER — Other Ambulatory Visit: Payer: Self-pay

## 2020-06-07 ENCOUNTER — Ambulatory Visit: Payer: BC Managed Care – PPO | Admitting: Allergy

## 2020-06-07 ENCOUNTER — Encounter: Payer: Self-pay | Admitting: Allergy

## 2020-06-07 ENCOUNTER — Encounter: Payer: Self-pay | Admitting: Family Medicine

## 2020-06-07 VITALS — BP 144/88 | HR 94 | Temp 98.7°F | Resp 20 | Ht 64.0 in | Wt 176.0 lb

## 2020-06-07 DIAGNOSIS — L2489 Irritant contact dermatitis due to other agents: Secondary | ICD-10-CM

## 2020-06-07 DIAGNOSIS — H1013 Acute atopic conjunctivitis, bilateral: Secondary | ICD-10-CM | POA: Diagnosis not present

## 2020-06-07 DIAGNOSIS — J3089 Other allergic rhinitis: Secondary | ICD-10-CM

## 2020-06-07 DIAGNOSIS — Z1231 Encounter for screening mammogram for malignant neoplasm of breast: Secondary | ICD-10-CM

## 2020-06-07 MED ORDER — IPRATROPIUM BROMIDE 0.06 % NA SOLN: NASAL | 5 refills | Status: DC

## 2020-06-07 MED ORDER — MONTELUKAST SODIUM 10 MG PO TABS: 10.0000 mg | ORAL_TABLET | Freq: Every day | ORAL | 5 refills | Status: DC

## 2020-06-07 NOTE — Patient Instructions (Addendum)
-   environmental allergy testing is positive to grass pollen, weed pollen, tree pollen, molds, cockroach and dust mites - allergen avoidance measures discussed/handouts provided - stop Allegra as not effective.  Try Xyzal 5mg  daily.  This is a long-acting antihistamine that may be more effective than Allegra - would take Singulair 10mg  daily at bedtime.  This is not an antihistamine but can work alongside antihistamines for improved allergy symptom control - for dry nasal passages recommend use of nasal saline spray or gel to help keep nose moisturized - for nasal congestion/drainage recommend use of nasal Atrovent 2 sprays each nostril as needed up to 3-4 times a day.  This can dry the nose if used too much.  Use nasal saline above while using Atrovent as you need to keep nose from getting too dry - for itchy/watery eyes use over-the-counter Pataday 1 drop each eye daily as needed - allergen immunotherapy discussed today including protocol, benefits and risk.  Informational handout provided.  If interested in this therapuetic option you can check with your insurance carrier for coverage.  Let us know if you would like to proceed with this option.    - recommend performing patch testing to see if we can determine what you are reactive to with contact exposure.  Patch testing is best placed on a Monday with return to office on Wednesday and Friday of same week for readings.  Once patches are placed on your back you should not get them wet. You can continue antihistamines and other medications while performing patch testing. You can schedule for patch placement on a Monday at your convenience.  - should significant symptoms recur or new symptoms occur, a journal is to be kept recording any foods eaten, beverages consumed, medications taken, activities performed, topical products used, and environmental conditions within a 6 hour time period prior to the onset of symptoms.  - can use the topical steroid  prescribed by your PCP if rash returns - pictures can be helpful as well in identifying rash  Follow-up in 4-6 months or sooner if needed

## 2020-06-07 NOTE — Progress Notes (Signed)
New Patient Note  RE: Sandra Briggs MRN: [id number] DOB: August 24, 1975 Date of Office Visit: 06/07/2020  Referring provider: Leeanne Rio, MD Primary care provider: Leeanne Rio, MD  Chief Complaint: allergies  History of present illness: Sandra Briggs is a 44 y.o. female presenting today for consultation for allergies.   She states she has always had seasonal allergies.  She states since she turned 41 she has had more intense allergy symptoms.  She states she has had to use benadryl if she cuts the grass.  Allegra is not cutting it anymore she reports. She also reports having to do warm compresses to help with crusting of the eye lashes. Also reports itchy/watery eyes, runny and stuffy nose, sneezing, throat clearing, ear itch and fullness as well as sinus headache.  Symptoms are year-round now (prior was spring and fall).  She has been taking allegra daily for past 5 years and feels it is not as effective.  She did try chlor-tab for a while that didn't help.  Has used flonase in the past but using no nasal sprays currently.  She does report having dry nose and having nosebleeds.  Using OTC eye drops like a visine allergy relief.  She has taken singulair in the past but it has been a long time since and doesn't remember if she found it to be effective.   She denies history of asthma.   She also reports having skin rashes.  She states the rash can occur anywhere.  She states initially she was getting itchy, bumpy rash that was also a bit burning/painful on her neck.  She states she had another episode on her arms and other episodes on her abdomen.  She states the rash that occurred on her neck and arms did have blisters.  No blisters on rash on abdomen and face.  Benadryl did help.  Prednisone also helped.  She states she was prescribed a topical cream but doesn't recall which and it did help some.  Also was using Sarna lotion that helped.  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  She uses Tide detergent and same fabric softener she has been using for years.  She states her PCP thought it was contact dermatitis.  Last episode of rash was in April 2021.    She states she has a fractured tooth and was having facial pain.  She went to ED for facial swelling.  She states she had recently traveled from [place].  She states the ED reported she had allergies and likely a sinus infection.  She was treated with augmentin course as well as tylenol and oxycodone.  She states within 3 days augmenting she was feeling better.   She states she had a bee sting reaction as a child and states she was taken the ED.  She states she was stung for the first time since childhood earlier this year and she had local swelling and pain only.    Review of systems: Review of Systems  Constitutional: Negative.   HENT: Positive for congestion and sinus pain.   Eyes: Negative.   Respiratory: Negative.   Cardiovascular: Negative.   Gastrointestinal: Negative.   Musculoskeletal: Negative.   Skin: Negative.   Neurological: Negative.     All other systems negative unless noted above in HPI  Past medical history: Past Medical History:  Diagnosis Date  .  Anxiety   . Chronic pain    flank  . Complication of anesthesia   . Dysuria-frequency syndrome   . Exercise-induced asthma   . Panic disorder   . Sprain of MCL (medial collateral ligament) of knee     Past surgical history: Past Surgical History:  Procedure Laterality Date  . APPENDECTOMY    . CESAREAN SECTION    . CRYOABLATION N/A   . ENDOMETRIAL ABLATION  2009  . TUBAL LIGATION  06/28/2005  . WISDOM TOOTH EXTRACTION  2000    Family history:  Family History  Problem Relation Age of Onset  . Lung cancer Mother   . Allergic rhinitis Mother   . Fibroids Sister   . Allergic rhinitis Sister   . Hyperlipidemia Maternal Grandmother   .  Allergic rhinitis Maternal Aunt   . Allergic rhinitis Maternal Uncle   . Allergic rhinitis Maternal Grandfather   . Eczema Daughter   . Food Allergy Son   . Asthma Neg Hx   . Urticaria Neg Hx   . Immunodeficiency Neg Hx   . Atopy Neg Hx   . Angioedema Neg Hx     Social history: Lives in a home with out carpeting with electric heating and central cooling.  Cat in the home.  There was a dog and a goat outside the home.  There is no concern for water damage, mildew or roaches in the home.  She is a Technical brewer.  She does smoke cigarettes since 2000 on and off for 20 years of half pack to 1 pack/day.  She does report stopping from 2004 to 2005 and again from 2005 2006 in again from 2006-2010.  Medication List: Current Outpatient Medications  Medication Sig Dispense Refill  . chlorhexidine (PERIDEX) 0.12 % solution SMARTSIG:By Mouth    . fexofenadine-pseudoephedrine (ALLEGRA-D 24) 180-240 MG 24 hr tablet Take 1 tablet by mouth daily.    Marland Kitchen gabapentin (NEURONTIN) 300 MG capsule Take 1 capsule (300 mg total) by mouth 3 (three) times daily. 270 capsule 1  . HYDROcodone-acetaminophen (NORCO) 10-325 MG tablet Take 1 tablet by mouth every 12 (twelve) hours as needed for severe pain. 60 tablet 0  . HYDROcodone-acetaminophen (NORCO) 10-325 MG tablet Take 1 tablet by mouth every 12 (twelve) hours. 60 tablet 0  . HYDROcodone-acetaminophen (NORCO) 10-325 MG tablet Take 1 tablet by mouth every 12 (twelve) hours. 60 tablet 0  . ibuprofen (ADVIL,MOTRIN) 200 MG tablet Take 400 mg by mouth 2 (two) times daily as needed.    Marland Kitchen amoxicillin-clavulanate (AUGMENTIN) 875-125 MG tablet Take 1 tablet by mouth 2 (two) times daily. One po bid x 7 days (Patient not taking: Reported on 06/07/2020) 14 tablet 0   No current facility-administered medications for this visit.    Known medication allergies: Allergies  Allergen Reactions  . Bee Venom Anaphylaxis  . Sulfonamide Derivatives Anaphylaxis  . Tramadol  Hcl Hives    REACTION: itching, swelling in mouth     Physical examination: Blood pressure (!) 144/88, pulse 94, temperature 98.7 F (37.1 C), temperature source Temporal, resp. rate 20, height 5\' 4"  (1.626 m), weight 176 lb (79.8 kg), SpO2 95 %.  General: Alert, interactive, in no acute distress. HEENT: PERRLA, TMs pearly gray, turbinates minimally edematous without discharge, post-pharynx non erythematous. Neck: Supple without lymphadenopathy. Lungs: Clear to auscultation without wheezing, rhonchi or rales. {no increased work of breathing. CV: Normal S1, S2 without murmurs. Abdomen: Nondistended, nontender. Skin: Warm and dry, without lesions or rashes. Extremities:  No clubbing, cyanosis or edema. Neuro:   Grossly intact.  Diagnositics/Labs:  Allergy testing: environmental allergy skin prick testing is positive to grass pollens, weed pollens, beech.  Intradermal testing is positive to mold mix 4, cockroach and mite mix. Allergy testing results were read and interpreted by provider, documented by clinical staff.   Assessment and plan: Allergic rhinitis with conjunctivitis  - environmental allergy testing is positive to grass pollen, weed pollen, tree pollen, molds, cockroach and dust mites - allergen avoidance measures discussed/handouts provided - stop Allegra as not effective.  Try Xyzal 5mg  daily.  This is a long-acting antihistamine that may be more effective than Allegra - would take Singulair 10mg  daily at bedtime.  This is not an antihistamine but can work alongside antihistamines for improved allergy symptom control - for dry nasal passages recommend use of nasal saline spray or gel to help keep nose moisturized - for nasal congestion/drainage recommend use of nasal Atrovent 2 sprays each nostril as needed up to 3-4 times a day.  This can dry the nose if used too much.  Use nasal saline above while using Atrovent as you need to keep nose from getting too dry - for  itchy/watery eyes use over-the-counter Pataday 1 drop each eye daily as needed - allergen immunotherapy discussed today including protocol, benefits and risk.  Informational handout provided.  If interested in this therapuetic option you can check with your insurance carrier for coverage.  Let us know if you would like to proceed with this option.    Dermatitis, likely contact - recommend performing patch testing to see if we can determine what you are reactive to with contact exposure.  Patch testing is best placed on a Monday with return to office on Wednesday and Friday of same week for readings.  Once patches are placed on your back you should not get them wet. You can continue antihistamines and other medications while performing patch testing. You can schedule for patch placement on a Monday at your convenience.  - should significant symptoms recur or new symptoms occur, a journal is to be kept recording any foods eaten, beverages consumed, medications taken, activities performed, topical products used, and environmental conditions within a 6 hour time period prior to the onset of symptoms.  - can use the topical steroid prescribed by your PCP if rash returns - pictures can be helpful as well in identifying rash  Follow-up in 4-6 months or sooner if needed I appreciate the opportunity to take part in Tyrica's care. Please do not hesitate to contact me with questions.  Sincerely,   Prudy Feeler, MD Allergy/Immunology [facility]

## 2020-07-20 ENCOUNTER — Ambulatory Visit: Payer: BC Managed Care – PPO | Admitting: Family Medicine

## 2020-07-24 ENCOUNTER — Encounter: Payer: Self-pay | Admitting: Family Medicine

## 2020-07-25 ENCOUNTER — Encounter: Payer: BC Managed Care – PPO | Admitting: Dermatology

## 2020-07-27 ENCOUNTER — Other Ambulatory Visit: Payer: BC Managed Care – PPO

## 2020-07-27 ENCOUNTER — Ambulatory Visit: Payer: BC Managed Care – PPO | Admitting: Family Medicine

## 2020-07-27 ENCOUNTER — Other Ambulatory Visit: Payer: Self-pay

## 2020-07-27 VITALS — BP 138/85 | HR 100 | Ht 64.0 in | Wt 178.6 lb

## 2020-07-27 DIAGNOSIS — Z20822 Contact with and (suspected) exposure to covid-19: Secondary | ICD-10-CM | POA: Diagnosis not present

## 2020-07-27 DIAGNOSIS — G8929 Other chronic pain: Secondary | ICD-10-CM | POA: Diagnosis not present

## 2020-07-27 DIAGNOSIS — Z23 Encounter for immunization: Secondary | ICD-10-CM

## 2020-07-27 DIAGNOSIS — M542 Cervicalgia: Secondary | ICD-10-CM

## 2020-07-27 DIAGNOSIS — J3089 Other allergic rhinitis: Secondary | ICD-10-CM

## 2020-07-27 MED ORDER — GABAPENTIN 300 MG PO CAPS: 300.0000 mg | ORAL_CAPSULE | Freq: Three times a day (TID) | ORAL | 3 refills | Status: DC

## 2020-07-27 MED ORDER — HYDROCODONE-ACETAMINOPHEN 10-325 MG PO TABS: 1.0000 | ORAL_TABLET | Freq: Two times a day (BID) | ORAL | 0 refills | Status: DC

## 2020-07-27 MED ORDER — HYDROCODONE-ACETAMINOPHEN 10-325 MG PO TABS: 1.0000 | ORAL_TABLET | Freq: Two times a day (BID) | ORAL | 0 refills | Status: DC | PRN

## 2020-07-27 NOTE — Patient Instructions (Addendum)
Go get COVID tested today [place], [place], [place] [address] Appointment is at 10am  Refilled medications  Flu shot today  Follow up in 3 months  Be well, Dr. [name]

## 2020-07-27 NOTE — Progress Notes (Signed)
   SUBJECTIVE:   Chronic pain: Needs refills on her Norco and Gabapentin. Tolerated well without sedation. Happy with level of pain relief she is having. Keeping meds locked up as she has teenagers at home.   Allergies- Saw Allergist who would like to start her on desensitization shots. Also switched her from Allegra to Singulair and Atrovent. Tolerating these well and feeling better.  Of note, last week had to cancel an appointment here due to feeling unwell. Had fever, cough and headache. Did not get COVID tested, has been at home. Is on day 7 of illness. Now feels better. Is vaccinated against COVID.   OBJECTIVE:   BP 138/85   Pulse 100   Ht 5\' 4"  (1.626 m)   Wt 178 lb 9.6 oz (81 kg)   SpO2 99%   BMI 30.66 kg/m   Gen: no acute distress, pleasant, cooperative Resp: normal work of breathing, speaks in full sentences without distress Neuro: alert, speech normal, grossly nonfocal Psych: normal range of affect, well groomed, speech normal in rate and volume, normal eye contact   ASSESSMENT/PLAN:   Allergic rhinitis Follows with allergist. Doing very well on new medication regimen. Will be starting desensitization shots soon.   Encounter for chronic pain management Doing well on current regimen of Norco 10-325mg  BID and Gabapentin 300 TID. UDS appropriate at last visit three months ago. Will defer UDS today. PDMP reviewed with appropriate findings. - Refill Norco and Gabapentin - Follow-up in 3 months   Recent viral illness - recommend testing for COVID as would impact quarantine situation. On day 7 since symptoms began and already feeling better. Scheduled for COVID test this morning  Health maintenance: -flu shot given today (given already after rooming  prior to me assessing her recent URI symptoms) -encouraged exercise  [place]   Patient seen along with MS3 student [name]. I personally evaluated this  patient along with the student, and verified all aspects of the history, physical exam, and medical decision making as documented by the student. I agree with the student's documentation and have made all necessary edits.  Follow up in 3 months   Chrisandra Netters, MD  [place]

## 2020-07-27 NOTE — Assessment & Plan Note (Addendum)
Follows with allergist. Doing very well on new medication regimen. Will be starting desensitization shots soon.

## 2020-07-27 NOTE — Assessment & Plan Note (Addendum)
Doing well on current regimen of Norco 10-325mg  BID and Gabapentin 300 TID. UDS appropriate at last visit three months ago. Will defer UDS today. PDMP reviewed with appropriate findings. - Refill Norco and Gabapentin - Follow-up in 3 months

## 2020-07-28 LAB — NOVEL CORONAVIRUS, NAA: SARS-CoV-2, NAA: NOT DETECTED

## 2020-07-28 LAB — SARS-COV-2, NAA 2 DAY TAT

## 2020-08-04 ENCOUNTER — Encounter: Payer: Self-pay | Admitting: Family Medicine

## 2020-09-09 ENCOUNTER — Encounter: Payer: Self-pay | Admitting: Family Medicine

## 2020-09-09 DIAGNOSIS — U071 COVID-19: Secondary | ICD-10-CM

## 2020-09-21 ENCOUNTER — Ambulatory Visit: Payer: BC Managed Care – PPO

## 2020-10-20 ENCOUNTER — Other Ambulatory Visit: Payer: Self-pay

## 2020-10-20 ENCOUNTER — Ambulatory Visit: Payer: BC Managed Care – PPO | Admitting: Family Medicine

## 2020-10-20 DIAGNOSIS — H1089 Other conjunctivitis: Secondary | ICD-10-CM

## 2020-10-20 DIAGNOSIS — M542 Cervicalgia: Secondary | ICD-10-CM

## 2020-10-20 DIAGNOSIS — G8929 Other chronic pain: Secondary | ICD-10-CM

## 2020-10-20 MED ORDER — HYDROCODONE-ACETAMINOPHEN 10-325 MG PO TABS: 1.0000 | ORAL_TABLET | Freq: Two times a day (BID) | ORAL | 0 refills | Status: DC

## 2020-10-20 MED ORDER — CIPROFLOXACIN HCL 0.3 % OP SOLN: 2.0000 [drp] | OPHTHALMIC | 0 refills | Status: DC

## 2020-10-20 MED ORDER — GABAPENTIN 300 MG PO CAPS: 300.0000 mg | ORAL_CAPSULE | Freq: Three times a day (TID) | ORAL | 0 refills | Status: DC

## 2020-10-20 NOTE — Assessment & Plan Note (Signed)
Could be related to covid infection, unilateral nature with sticky discharge  raises suspicion for bacterial infection  Treat with ciprofloxacin ophthalmic solution  F/u in 2 weeks

## 2020-10-20 NOTE — Progress Notes (Signed)
    SUBJECTIVE:   CHIEF COMPLAINT / HPI: Right eye redness  Patient states that she has been experiencing pruritus and redness of her right since January 2022.  Patient states that she can improve the redness of her eye by using over-the-counter eyedrops such as Clear Eyes  E but the pruritus continues.  She reports being positive for Covid prior to onset of the symptoms.  Patient states that her vision feels blurry and that she has not seen an eye doctor in several years.  Patient also denies any tenderness to her eye.  She reports discharge that is sticky and not as watery as her chronic allergic conjunctivitis.  She denies wearing contact lenses.  Chronic Pain Medications  Near end of visit, patient requests refill of chronic pain medications prior to her next appt with her PCP. 30 day supply prescribed, explained this is one time occurrence.   PERTINENT  PMH / PSH:  OBJECTIVE:   BP 132/82   Pulse 95   Ht 5\' 4"  (1.626 m)   Wt 178 lb 9.6 oz (81 kg)   SpO2 99%   BMI 30.66 kg/m    HEENT: No oropharyngeal erythema or exudate, bilateral tympanic membranes with normal light reflex, no effusion or erythema, no bulging tympanic membranes, no rhinorrhea, right conjunctive a with medial and lateral injection, pupils are equal round and reactive to light bilaterally, patient has normal accommodation of pupils, no eye discharge appreciated   ASSESSMENT/PLAN:   Conjunctivitis Could be related to covid infection, unilateral nature with sticky discharge  raises suspicion for bacterial infection  Treat with ciprofloxacin ophthalmic solution  F/u in 2 weeks  Encounter for chronic pain management Provided 30 days worth of refills for pain medications, patient has appt near end of this month with PCP Reviewed PDMP     Eulis Foster, MD [place]

## 2020-10-20 NOTE — Patient Instructions (Signed)
I have prescribed some eyedrops for you to using your right eye. Please use the drops as prescribed.  Please follow-up with [place] in 2 weeks to see if there has been improvement in your symptoms.  I do recommend continue to use a warm compress and not using anything else in your eyes if possible.   Please review the attached list for potential optometry providers as we recommend an eye exam for your vision.

## 2020-10-22 ENCOUNTER — Encounter: Payer: Self-pay | Admitting: Family Medicine

## 2020-10-22 NOTE — Assessment & Plan Note (Signed)
Provided 30 days worth of refills for pain medications, patient has appt near end of this month with PCP Reviewed PDMP

## 2020-11-16 ENCOUNTER — Ambulatory Visit: Payer: BC Managed Care – PPO | Admitting: Family Medicine

## 2020-11-16 ENCOUNTER — Other Ambulatory Visit: Payer: Self-pay

## 2020-11-16 ENCOUNTER — Other Ambulatory Visit: Payer: Self-pay | Admitting: Allergy

## 2020-11-16 DIAGNOSIS — M542 Cervicalgia: Secondary | ICD-10-CM

## 2020-11-16 DIAGNOSIS — G8929 Other chronic pain: Secondary | ICD-10-CM

## 2020-11-16 MED ORDER — HYDROCODONE-ACETAMINOPHEN 10-325 MG PO TABS: 1.0000 | ORAL_TABLET | Freq: Two times a day (BID) | ORAL | 0 refills | Status: DC

## 2020-11-16 NOTE — Telephone Encounter (Signed)
Called to inform her that we can send in a 30 day refill but she needs to schedule an appointment. Was not able to leave a message mail box is full.

## 2020-11-16 NOTE — Telephone Encounter (Signed)
Patient calls nurse line for rx refill of hydrocodone. Patient was originally scheduled with PCP for follow up this morning, however, had to reschedule due to having sick children at home.   Patient has rescheduled for 4/12, however, will run out of pain medication on 4/1. Please advise if partial refill can be sent to last patient until next appointment.   Talbot Grumbling, RN

## 2020-11-16 NOTE — Telephone Encounter (Signed)
Called patient and informed.   Naw Lasala C Leston Schueller, RN  

## 2020-11-16 NOTE — Telephone Encounter (Signed)
PDMP reviewed with appropriate findings. I will refill x1 month, patient should keep her appointment on 4/12 Please let her know  Thanks Leeanne Rio, MD

## 2020-11-20 DIAGNOSIS — H10501 Unspecified blepharoconjunctivitis, right eye: Secondary | ICD-10-CM | POA: Diagnosis not present

## 2020-11-28 ENCOUNTER — Ambulatory Visit (INDEPENDENT_AMBULATORY_CARE_PROVIDER_SITE_OTHER): Payer: BC Managed Care – PPO

## 2020-11-28 ENCOUNTER — Other Ambulatory Visit: Payer: Self-pay

## 2020-11-28 ENCOUNTER — Ambulatory Visit: Payer: BC Managed Care – PPO | Admitting: Family Medicine

## 2020-11-28 ENCOUNTER — Encounter: Payer: Self-pay | Admitting: Family Medicine

## 2020-11-28 VITALS — BP 110/80 | HR 86 | Wt 183.4 lb

## 2020-11-28 DIAGNOSIS — Z0000 Encounter for general adult medical examination without abnormal findings: Secondary | ICD-10-CM | POA: Diagnosis not present

## 2020-11-28 DIAGNOSIS — M542 Cervicalgia: Secondary | ICD-10-CM | POA: Diagnosis not present

## 2020-11-28 DIAGNOSIS — Z23 Encounter for immunization: Secondary | ICD-10-CM

## 2020-11-28 DIAGNOSIS — Z79899 Other long term (current) drug therapy: Secondary | ICD-10-CM | POA: Diagnosis not present

## 2020-11-28 DIAGNOSIS — H10502 Unspecified blepharoconjunctivitis, left eye: Secondary | ICD-10-CM

## 2020-11-28 NOTE — Assessment & Plan Note (Signed)
Pt received second dose of Covid vx in September 2021, and had outpatient managed Covid-19 in January 2022 without receipt of antibodies or other treatment.  Pt would like to receive age-appropriate screening mammogram, as previously referred. No overtly concerning family hx, however pt prefers screening in her 72s. Discussed she needs to wait 6 weeks after Covid booster to limit false positivity from reactive lymphadenopathy.  Plan: Gave Covid-19 booster in office today. Proceed with Screening Mammogram as previously referred, and wait at least 6 weeks from booster to receive Mmg.

## 2020-11-28 NOTE — Assessment & Plan Note (Signed)
Resolving well. Saw Ophthalmology one week ago.  Plan: Continue Tobramycin drops as per ophthalmology

## 2020-11-28 NOTE — Progress Notes (Signed)
    SUBJECTIVE:   CHIEF COMPLAINT / HPI:   Sandra Briggs returns today for follow up on her chronic neck pain and resolving blepharoconjunctivitis. The pt saw ophthalmology about one week ago and began Tobramycin-dexamethasone drops four times a day, for five days- today is her fourth day and she endorses much symptomatic improvement.  She notes that her chronic neck pain is well controlled with Norco and occasional use of Ibuprofen on her bad days. She does not exceed three days in a week of using 800mg  Ibuprofen, and denies blood in the stools or black stools. She continues on Neurontin and notes this has continued to be effective. Taking norco 10-325mg  one tab twice daily. Denies trouble controlling how often she takes the medication. Storing it safely. No side effects.  The pt notes that she had Covid in January with three days of fever, and up to two weeks of headache which has since resolved.  She has been using her Atrovent and Singulair regularly for her allergies and notes this is stable.  PERTINENT  PMH / PSH:  Cervicalgia Conjunctivitis Allergic Rhinitis  OBJECTIVE:   BP 110/80   Pulse 86   Wt 183 lb 6.4 oz (83.2 kg)   SpO2 98%   BMI 31.48 kg/m   Physical Exam Constitutional:      General: She is not in acute distress.    Appearance: Normal appearance. She is not ill-appearing.  HENT:     Head: Normocephalic and atraumatic.  Cardiovascular:     Rate and Rhythm: Normal rate and regular rhythm.     Pulses: Normal pulses.     Heart sounds: Normal heart sounds.  Pulmonary:     Effort: Pulmonary effort is normal.     Breath sounds: Normal breath sounds.  Skin:    General: Skin is warm and dry.  Neurological:     Mental Status: She is alert and oriented to person, place, and time.  Psychiatric:        Mood and Affect: Mood normal.        Behavior: Behavior normal.   Full strength bilateral upper extremities    ASSESSMENT/PLAN:   Cervicalgia Stable chronic  cervicalgia with therapeutic Norco dose. Benefits continue to outweigh risks of therapy.  Plan: Routine urine drug screen today After results, will refill Norco  Conjunctivitis Resolving well. Saw Ophthalmology one week ago.  Plan: Continue Tobramycin drops as per ophthalmology   Healthcare maintenance Pt received second dose of Covid vx in September 2021, and had outpatient managed Covid-19 in January 2022 without receipt of antibodies or other treatment.  Pt would like to receive age-appropriate screening mammogram, as previously referred. No overtly concerning family hx, however pt prefers screening in her 1s. Discussed she needs to wait 6 weeks after Covid booster to limit false positivity from reactive lymphadenopathy.  Plan: Gave Covid-19 booster in office today. Proceed with Screening Mammogram as previously referred, and wait at least 6 weeks from booster to receive [place]     Patient seen along with MS3 student Sandra Briggs. I personally evaluated this patient along with the student, and verified all aspects of the history, physical exam, and medical decision making as documented by the student. I agree with the student's documentation and have made all necessary edits.  Chrisandra Netters, MD  [place]

## 2020-11-28 NOTE — Assessment & Plan Note (Addendum)
Stable chronic cervicalgia with therapeutic Norco dose. Benefits continue to outweigh risks of therapy.  Plan: Routine urine drug screen today After results, will refill Norco

## 2020-11-28 NOTE — Patient Instructions (Addendum)
It was great to see you again today!  Urine test today, will refill medications when it has resulted  COVID booster today  To schedule your mammogram, call the [facility] at [phone]. They are located at [address].   Follow up with me in 3 months, sooner if needed  Be well, Dr. [name]

## 2020-11-29 LAB — DRUG SCR UR, PAIN MGMT, REFLEX CONF
Amphetamine Scrn, Ur: NEGATIVE ng/mL
BARBITURATE SCREEN URINE: NEGATIVE ng/mL
BENZODIAZEPINE SCREEN, URINE: NEGATIVE ng/mL
Cocaine (Metab) Scrn, Ur: NEGATIVE ng/mL
Creatinine(Crt), U: 141.9 mg/dL (ref 20.0–300.0)
Methadone Screen, Urine: NEGATIVE ng/mL
OXYCODONE+OXYMORPHONE UR QL SCN: NEGATIVE ng/mL
Opiate Scrn, Ur: POSITIVE ng/mL — AB
Ph of Urine: 5.5 (ref 4.5–8.9)
Phencyclidine Qn, Ur: NEGATIVE ng/mL
Propoxyphene Scrn, Ur: NEGATIVE ng/mL

## 2020-11-30 ENCOUNTER — Other Ambulatory Visit: Payer: Self-pay | Admitting: Allergy

## 2020-12-03 ENCOUNTER — Other Ambulatory Visit: Payer: Self-pay | Admitting: Family Medicine

## 2020-12-03 DIAGNOSIS — G8929 Other chronic pain: Secondary | ICD-10-CM

## 2020-12-03 DIAGNOSIS — M542 Cervicalgia: Secondary | ICD-10-CM

## 2020-12-03 NOTE — Progress Notes (Signed)
UDS and PDMP appropriate, will refill pain medications  Leeanne Rio, MD

## 2020-12-11 MED ORDER — HYDROCODONE-ACETAMINOPHEN 10-325 MG PO TABS: 1.0000 | ORAL_TABLET | Freq: Two times a day (BID) | ORAL | 0 refills | Status: DC

## 2020-12-11 MED ORDER — HYDROCODONE-ACETAMINOPHEN 10-325 MG PO TABS
1.0000 | ORAL_TABLET | Freq: Two times a day (BID) | ORAL | 0 refills | Status: DC
Start: 2020-12-11 — End: 2021-02-21

## 2020-12-20 ENCOUNTER — Encounter: Payer: Self-pay | Admitting: Family Medicine

## 2020-12-22 ENCOUNTER — Telehealth: Payer: Self-pay

## 2020-12-22 MED ORDER — HYDROCODONE-ACETAMINOPHEN 10-325 MG PO TABS: 1.0000 | ORAL_TABLET | Freq: Two times a day (BID) | ORAL | 0 refills | Status: AC | PRN

## 2020-12-22 NOTE — Telephone Encounter (Signed)
Sent in 7 day course for patient while we await PA. Is there anything we need to do to move that along?  Thanks Leeanne Rio, MD

## 2020-12-22 NOTE — Telephone Encounter (Signed)
Patient calls nurse line stating she has changed insurances and her Hydrocodone needs a PA. Patient was only allowed to pick up a 7 day supply, #14 tabs. I spoke with pharmacist and patient is out of meds as of today. Can we send in a 7 day supply while a PA is attempted for the full #60 tabs. Please advise.

## 2020-12-24 ENCOUNTER — Encounter: Payer: Self-pay | Admitting: Family Medicine

## 2020-12-25 NOTE — Telephone Encounter (Signed)
Received fax from pharmacy, PA needed on Hydrocodone-Acetampinophen. Clinical questions submitted via Cover My Meds. Waiting on response, could take up to 72 hours.  Cover My Meds info: Key: [id number]

## 2020-12-25 NOTE — Telephone Encounter (Signed)
Medication approved. I called the pharmacy and they stated they can not run the prescription now since she just got a 7 day supply on 5/6. Patient advised to call pharmacy on 5/13 to release the remainder of the prescription.

## 2021-02-05 ENCOUNTER — Other Ambulatory Visit: Payer: Self-pay

## 2021-02-05 ENCOUNTER — Ambulatory Visit
Admission: RE | Admit: 2021-02-05 | Discharge: 2021-02-05 | Disposition: A | Discharge status: Home / Self Care | Payer: BC Managed Care – PPO | Source: Ambulatory Visit | Attending: Family Medicine | Admitting: Family Medicine

## 2021-02-05 DIAGNOSIS — Z1231 Encounter for screening mammogram for malignant neoplasm of breast: Secondary | ICD-10-CM

## 2021-02-07 ENCOUNTER — Encounter: Payer: Self-pay | Admitting: Family Medicine

## 2021-02-07 ENCOUNTER — Other Ambulatory Visit: Payer: Self-pay | Admitting: Family Medicine

## 2021-02-07 DIAGNOSIS — R928 Other abnormal and inconclusive findings on diagnostic imaging of breast: Secondary | ICD-10-CM

## 2021-02-21 ENCOUNTER — Other Ambulatory Visit: Payer: Self-pay

## 2021-02-21 DIAGNOSIS — G8929 Other chronic pain: Secondary | ICD-10-CM

## 2021-02-21 MED ORDER — HYDROCODONE-ACETAMINOPHEN 10-325 MG PO TABS: 1.0000 | ORAL_TABLET | Freq: Two times a day (BID) | ORAL | 0 refills | Status: DC

## 2021-02-21 NOTE — Telephone Encounter (Signed)
Patient has an apt on 8/9.

## 2021-02-28 ENCOUNTER — Ambulatory Visit
Admission: RE | Admit: 2021-02-28 | Discharge: 2021-02-28 | Disposition: A | Discharge status: Home / Self Care | Payer: BC Managed Care – PPO | Source: Ambulatory Visit | Attending: Family Medicine | Admitting: Family Medicine

## 2021-02-28 ENCOUNTER — Other Ambulatory Visit: Payer: Self-pay | Admitting: Family Medicine

## 2021-02-28 ENCOUNTER — Other Ambulatory Visit: Payer: Self-pay

## 2021-02-28 ENCOUNTER — Ambulatory Visit: Payer: BC Managed Care – PPO

## 2021-02-28 DIAGNOSIS — N6489 Other specified disorders of breast: Secondary | ICD-10-CM | POA: Diagnosis not present

## 2021-02-28 DIAGNOSIS — R922 Inconclusive mammogram: Secondary | ICD-10-CM | POA: Diagnosis not present

## 2021-02-28 DIAGNOSIS — R928 Other abnormal and inconclusive findings on diagnostic imaging of breast: Secondary | ICD-10-CM

## 2021-03-23 ENCOUNTER — Encounter: Payer: Self-pay | Admitting: Family Medicine

## 2021-03-23 DIAGNOSIS — G8929 Other chronic pain: Secondary | ICD-10-CM

## 2021-03-23 MED ORDER — HYDROCODONE-ACETAMINOPHEN 10-325 MG PO TABS
1.0000 | ORAL_TABLET | Freq: Two times a day (BID) | ORAL | 0 refills | Status: DC
Start: 2021-03-23 — End: 2021-04-19

## 2021-03-23 MED ORDER — NIRMATRELVIR/RITONAVIR (PAXLOVID)TABLET: 3.0000 | ORAL_TABLET | Freq: Two times a day (BID) | ORAL | 0 refills | Status: DC

## 2021-03-23 MED ORDER — NIRMATRELVIR/RITONAVIR (PAXLOVID)TABLET: 3.0000 | ORAL_TABLET | Freq: Two times a day (BID) | ORAL | 0 refills | Status: AC

## 2021-03-23 NOTE — Telephone Encounter (Signed)
Called and spoke with patient Confirmed medication list Only potential interaction with paxlovid is hydrocodone - recommend dose reduction by 50%. She will break norco tablets in half while on paxlovid. Counseled on risk of metallic taste in mouth Patient agreeable & appreciative. Paxlovid rx & norco refill sent in.  Leeanne Rio, MD

## 2021-03-23 NOTE — Addendum Note (Signed)
Addended by: Leeanne Rio on: 03/23/2021 04:15 PM   Modules accepted: Orders

## 2021-03-27 ENCOUNTER — Ambulatory Visit: Payer: BC Managed Care – PPO | Admitting: Family Medicine

## 2021-04-19 ENCOUNTER — Ambulatory Visit: Payer: BC Managed Care – PPO | Admitting: Family Medicine

## 2021-04-19 ENCOUNTER — Encounter: Payer: Self-pay | Admitting: Family Medicine

## 2021-04-19 ENCOUNTER — Other Ambulatory Visit: Payer: Self-pay

## 2021-04-19 DIAGNOSIS — G8929 Other chronic pain: Secondary | ICD-10-CM

## 2021-04-19 DIAGNOSIS — M542 Cervicalgia: Secondary | ICD-10-CM | POA: Diagnosis not present

## 2021-04-19 MED ORDER — KETOCONAZOLE 2 % EX CREA: 1.0000 "application " | TOPICAL_CREAM | Freq: Two times a day (BID) | CUTANEOUS | 0 refills | Status: DC

## 2021-04-19 MED ORDER — HYDROCODONE-ACETAMINOPHEN 10-325 MG PO TABS: 1.0000 | ORAL_TABLET | Freq: Two times a day (BID) | ORAL | 0 refills | Status: DC

## 2021-04-19 NOTE — Progress Notes (Signed)
  Date of Visit: 04/19/2021   SUBJECTIVE:   HPI:  Sandra Briggs presents today for routine follow up.  Chronic pain - taking hydrocodone-acetaminophen 10-'325mg'$  twice daily. Tolerating this well. It helps her be functional by reducing pain in neck. Storing it safely in lock box. Denies excessive sedation. Also taking gabapentin '300mg'$  three times daily.  Athlete's foot - has had between toes and on bottom of feet. Tried lotrimin spray, athletes feet cream, soaking in listerine, some of which helped but never fully went away.  OBJECTIVE:   BP 132/85   Pulse 88   Ht '5\' 4"'$  (1.626 m)   Wt 181 lb 6.4 oz (82.3 kg)   SpO2 99%   BMI 31.14 kg/m  Gen: no acute distress, pleasant cooperative HEENT: normocephalic, atraumatic. Full active ROM of neck. Lungs: normal work of breathing  Neuro: alert, speech normal, grossly nonfocal Ext: grip 5/5 on R, 4/5 on L (chronically the case). Toes with mild skin breakdown between/on plantar surface of forefoot  ASSESSMENT/PLAN:   Health maintenance:  -recommend flu shot once it's in stock  Encounter for chronic pain management Overall stable PDMP reviewed with appropriate findings Refill x3 months   Tinea pedis Failed over the counter therapy Rx ketoconazole cream Follow up if not improving  FOLLOW UP: Follow up in 3 months for chronic pain  Sandra Briggs, [place]

## 2021-04-19 NOTE — Patient Instructions (Signed)
It was great to see you again today!  Sent in refills Follow up in 3 months Sent in new cream for your feet Come back for flu shot once available  Be well, Dr. [name]

## 2021-04-24 NOTE — Assessment & Plan Note (Signed)
Overall stable PDMP reviewed with appropriate findings Refill x3 months

## 2021-05-19 ENCOUNTER — Other Ambulatory Visit: Payer: Self-pay | Admitting: Allergy

## 2021-06-28 ENCOUNTER — Ambulatory Visit (INDEPENDENT_AMBULATORY_CARE_PROVIDER_SITE_OTHER): Payer: BC Managed Care – PPO

## 2021-06-28 ENCOUNTER — Encounter: Payer: Self-pay | Admitting: Family Medicine

## 2021-06-28 ENCOUNTER — Ambulatory Visit: Payer: BC Managed Care – PPO | Admitting: Family Medicine

## 2021-06-28 ENCOUNTER — Other Ambulatory Visit: Payer: Self-pay

## 2021-06-28 VITALS — BP 136/78 | HR 82 | Ht 64.0 in | Wt 183.2 lb

## 2021-06-28 DIAGNOSIS — G8929 Other chronic pain: Secondary | ICD-10-CM

## 2021-06-28 DIAGNOSIS — Z23 Encounter for immunization: Secondary | ICD-10-CM

## 2021-06-28 DIAGNOSIS — N930 Postcoital and contact bleeding: Secondary | ICD-10-CM | POA: Diagnosis not present

## 2021-06-28 DIAGNOSIS — M542 Cervicalgia: Secondary | ICD-10-CM | POA: Diagnosis not present

## 2021-06-28 MED ORDER — HYDROCODONE-ACETAMINOPHEN 10-325 MG PO TABS: 1.0000 | ORAL_TABLET | Freq: Two times a day (BID) | ORAL | 0 refills | Status: DC

## 2021-06-28 NOTE — Patient Instructions (Signed)
It was great to see you again today!  Call to schedule with Dr. Arnoldo Morale of [facility].  Call to schedule with Dr. Harolyn Rutherford of [facility] on [address] in [place]  Flu shot, COVID booster, and Tdap given today  Refilled medications for 3 more months You will need to call for a refill in February and then I'll see you in March when I'm back from leave.  Be well, Dr. [name]

## 2021-06-28 NOTE — Progress Notes (Signed)
  Date of Visit: 06/28/2021   SUBJECTIVE:   HPI:  Sandra Briggs presents today for routine follow up and medication refill.  Chronic pain - taking norco 10-325mg  one tablet twice daily with good relief of neck pain. Storing medication safely. No issues controlling how often she is taking the medication. No adverse side effects. Last fill on 10/27 per PDMP. She has noted increased pain in her lower back lately and thinks it's time to get back in with the neurosurgeon who she saw in 2016 (Dr. Arnoldo Morale with [facility]).   Coital bleeding - previously saw GYN in 2019 for postcoital bleeding and was recommended to consider cryotherapy of transformation zone. Would like to follow up with GYN again.  OBJECTIVE:   BP 136/78   Pulse 82   Ht 5\' 4"  (1.626 m)   Wt 183 lb 3.2 oz (83.1 kg)   SpO2 100%   BMI 31.45 kg/m  Gen: no acute distress, pleasant, cooperative HEENT: normocephalic, atraumatic  Lungs: normal work of breathing  Neuro: alert, speech normal, grossly nonfocal Ext: L grip 4/5, R grip 5/5 strength. Full range of motion of neck.   ASSESSMENT/PLAN:   Health maintenance:  -flu vaccine given today -COVID bivalent booster given today -Tdap given today  Encounter for chronic pain management Pain stable. PDMP reviewed with appropriate findings. Will refill x3 months. Patient will follow up with me in March when I return from maternity leave. If she calls to request new rx in February, will be fine to refill if PDMP appropriate. Advised to schedule with neurosurgery for follow up of her low back pain.   Postcoital bleeding Encouraged to follow up with OBGYN Dr. Harolyn Rutherford. Provided phone # for her office so patient can schedule.  FOLLOW UP: Follow up in March for chronic pain  [name], [place]

## 2021-07-05 NOTE — Assessment & Plan Note (Signed)
Pain stable. PDMP reviewed with appropriate findings. Will refill x3 months. Patient will follow up with me in March when I return from maternity leave. If she calls to request new rx in February, will be fine to refill if PDMP appropriate. Advised to schedule with neurosurgery for follow up of her low back pain.

## 2021-07-05 NOTE — Assessment & Plan Note (Signed)
Encouraged to follow up with OBGYN Dr. Harolyn Rutherford. Provided phone # for her office so patient can schedule.

## 2021-08-06 ENCOUNTER — Other Ambulatory Visit (HOSPITAL_COMMUNITY)
Admission: RE | Admit: 2021-08-06 | Discharge: 2021-08-06 | Disposition: A | Discharge status: Home / Self Care | Payer: BC Managed Care – PPO | Source: Ambulatory Visit | Attending: Obstetrics and Gynecology | Admitting: Obstetrics and Gynecology

## 2021-08-06 ENCOUNTER — Other Ambulatory Visit: Payer: Self-pay

## 2021-08-06 ENCOUNTER — Encounter: Payer: Self-pay | Admitting: Obstetrics and Gynecology

## 2021-08-06 ENCOUNTER — Ambulatory Visit: Payer: BC Managed Care – PPO | Admitting: Obstetrics and Gynecology

## 2021-08-06 VITALS — BP 144/82 | HR 94 | Wt 181.0 lb

## 2021-08-06 DIAGNOSIS — R319 Hematuria, unspecified: Secondary | ICD-10-CM | POA: Diagnosis not present

## 2021-08-06 DIAGNOSIS — N941 Unspecified dyspareunia: Secondary | ICD-10-CM | POA: Insufficient documentation

## 2021-08-06 DIAGNOSIS — Z3202 Encounter for pregnancy test, result negative: Secondary | ICD-10-CM | POA: Diagnosis not present

## 2021-08-06 DIAGNOSIS — N930 Postcoital and contact bleeding: Secondary | ICD-10-CM

## 2021-08-06 LAB — POCT URINALYSIS DIP (DEVICE)
Bilirubin Urine: NEGATIVE
Glucose, UA: NEGATIVE mg/dL
Ketones, ur: NEGATIVE mg/dL
Leukocytes,Ua: NEGATIVE
Nitrite: NEGATIVE
Protein, ur: NEGATIVE mg/dL
Specific Gravity, Urine: 1.025 (ref 1.005–1.030)
Urobilinogen, UA: 0.2 mg/dL (ref 0.0–1.0)
pH: 5.5 (ref 5.0–8.0)

## 2021-08-06 LAB — POCT PREGNANCY, URINE: Preg Test, Ur: NEGATIVE

## 2021-08-06 NOTE — Progress Notes (Signed)
Obstetrics and Gynecology Visit Return Patient Evaluation  Appointment Date: 08/06/2021  Primary Care Provider: [place] [place] [facility]  Chief Complaint: dyspareunia and discomfort with arousal  History of Present Illness:  Sandra Briggs is a 45 y.o. [id number] (LMP: none due to ablation) with above CC. PMhx significant for h/o 2008 endometrial ablation, h/o BTL, h/o cervical cryo, h/o c-section x 3.   Dyspareunia: occurs each time, more so with insertion. Patient has had a long term partner. Patient seen by Dr. Harolyn Rutherford in 2019 for this and had a negative u/s. Exam showed: PELVIC: Normal appearing external genitalia; normal appearing vaginal mucosa and cervix. Cervical surface around TZ noted to be friable upon palpation, no apparent ectropion noted.  No abnormal discharge noted.  Normal uterine size, no other palpable masses, + anterior uterine tenderness. No adnexal tenderness. MD prescribed no further f/u and consider surgery if post coital bleeding not helped with meds  Discomfort with arousal: she thinks it has been going on around the same time as the dyspareunia. It happens almost every time. She describes it as a  "weird feeling, burning, swelling and full".   Post coital bleeding: happens each time.   Last sex two weeks ago Patient does not have periods.  No vaginal dryness or itching, no hot flashes or night sweats, no lubricant use, no lower urinary tract s/s  Review of Systems: as noted in the History of Present Illness.  Patient Active Problem List   Diagnosis Date Noted   Healthcare maintenance 11/28/2020   Rash and nonspecific skin eruption 11/19/2017   Postcoital bleeding 08/18/2017   Hand pain 08/18/2017   Conjunctivitis 04/11/2016   Encounter for chronic pain management 09/04/2014   Shortness of breath 03/04/2014   Pseudogout 12/30/2013   Overweight (BMI 25.0-29.9) 02/27/2012   Cervicalgia 09/07/2010    Bipolar disorder ([place]) 07/02/2007   PANIC DISORDER 06/08/2007   Allergic rhinitis 10/16/2006   Medications:  Cheril R. Polson had no medications administered during this visit. Current Outpatient Medications  Medication Sig Dispense Refill   gabapentin (NEURONTIN) 300 MG capsule Take 1 capsule (300 mg total) by mouth 3 (three) times daily. 270 capsule 0   HYDROcodone-acetaminophen (NORCO) 10-325 MG tablet Take 1 tablet by mouth every 12 (twelve) hours. 60 tablet 0   ibuprofen (ADVIL,MOTRIN) 200 MG tablet Take 400 mg by mouth 2 (two) times daily as needed.     ipratropium (ATROVENT) 0.06 % nasal spray USE 2 SPRAYS PER NOSTRIL 3-4 TIMES A DAY AS NEEDED 45 mL 3   montelukast (SINGULAIR) 10 MG tablet TAKE 1 TABLET BY MOUTH EVERYDAY AT BEDTIME 90 tablet 1   HYDROcodone-acetaminophen (NORCO) 10-325 MG tablet Take 1 tablet by mouth every 12 (twelve) hours. (Patient not taking: Reported on 08/06/2021) 60 tablet 0   HYDROcodone-acetaminophen (NORCO) 10-325 MG tablet Take 1 tablet by mouth every 12 (twelve) hours. (Patient not taking: Reported on 08/06/2021) 60 tablet 0   No current facility-administered medications for this visit.    Allergies: is allergic to bee venom, sulfonamide derivatives, and tramadol hcl.  Physical Exam:  BP (!) 144/82    Pulse 94    Wt 181 lb (82.1 kg)    BMI 31.07 kg/m  Body mass index is 31.07 kg/m. General appearance: Well nourished, well developed female in no acute distress.  Abdomen: diffusely non tender to palpation, non distended, and no masses, hernias CV: normal s1 and s2, no MRGs Pulmonary: CTAB, no respiratory  distress Neuro/Psych:  Normal mood and affect.    Pelvic exam:  EGBUS: normal Vaginal vault: normal Cervix: normal, no TZ or ectropion seen. Non friable Bimanual: discomfort on bimanual with the cervical hand and mildly so with the abdominal hand   Assessment: pt stable  Plan:  1. Hematuria, unspecified type Udip with hematuria. F/u formal  labs - Urinalysis, Routine w reflex microscopic - Urine Culture  2. Dyspareunia in female Repeat work up. If negative, may benefit from pelvic floor pt - Cervicovaginal ancillary only( [place]) - US PELVIC COMPLETE WITH TRANSVAGINAL; Future  3. Postcoital bleeding No obvious etiology. F/u lab work. I don't think cryo would be beneficial due to no ectropion seen on exam today - Cytology - PAP( [place])   RTC: after u/s.   Durene Romans MD Attending [facility] [employer])

## 2021-08-07 LAB — URINALYSIS, ROUTINE W REFLEX MICROSCOPIC
Bilirubin, UA: NEGATIVE
Glucose, UA: NEGATIVE
Ketones, UA: NEGATIVE
Leukocytes,UA: NEGATIVE
Nitrite, UA: NEGATIVE
Protein,UA: NEGATIVE
RBC, UA: NEGATIVE
Specific Gravity, UA: 1.02 (ref 1.005–1.030)
Urobilinogen, Ur: 0.2 mg/dL (ref 0.2–1.0)
pH, UA: 5.5 (ref 5.0–7.5)

## 2021-08-07 LAB — CYTOLOGY - PAP
Adequacy: ABSENT
Comment: NEGATIVE
Diagnosis: NEGATIVE
High risk HPV: NEGATIVE

## 2021-08-07 LAB — CERVICOVAGINAL ANCILLARY ONLY
Bacterial Vaginitis (gardnerella): POSITIVE — AB
Candida Glabrata: NEGATIVE
Candida Vaginitis: NEGATIVE
Chlamydia: NEGATIVE
Comment: NEGATIVE
Comment: NEGATIVE
Comment: NEGATIVE
Comment: NEGATIVE
Comment: NEGATIVE
Comment: NORMAL
Neisseria Gonorrhea: NEGATIVE
Trichomonas: NEGATIVE

## 2021-08-10 LAB — URINE CULTURE

## 2021-08-14 ENCOUNTER — Encounter: Payer: Self-pay | Admitting: Obstetrics and Gynecology

## 2021-08-14 MED ORDER — METRONIDAZOLE 500 MG PO TABS: 500.0000 mg | ORAL_TABLET | Freq: Two times a day (BID) | ORAL | 0 refills | Status: AC

## 2021-08-14 NOTE — Addendum Note (Signed)
Addended by: Aletha Halim on: 08/14/2021 12:32 PM   Modules accepted: Orders

## 2021-08-16 ENCOUNTER — Ambulatory Visit: Admission: RE | Admit: 2021-08-16 | Payer: BC Managed Care – PPO | Source: Ambulatory Visit

## 2021-08-31 ENCOUNTER — Ambulatory Visit: Payer: BC Managed Care – PPO | Admitting: Obstetrics and Gynecology

## 2021-09-05 ENCOUNTER — Other Ambulatory Visit: Payer: BC Managed Care – PPO

## 2021-10-01 ENCOUNTER — Other Ambulatory Visit: Payer: Self-pay

## 2021-10-01 DIAGNOSIS — M542 Cervicalgia: Secondary | ICD-10-CM

## 2021-10-01 DIAGNOSIS — G8929 Other chronic pain: Secondary | ICD-10-CM

## 2021-10-01 MED ORDER — HYDROCODONE-ACETAMINOPHEN 10-325 MG PO TABS: 1.0000 | ORAL_TABLET | Freq: Two times a day (BID) | ORAL | 0 refills | Status: DC

## 2021-10-29 ENCOUNTER — Other Ambulatory Visit: Payer: Self-pay | Admitting: Family Medicine

## 2021-10-29 ENCOUNTER — Encounter: Payer: Self-pay | Admitting: Family Medicine

## 2021-10-29 DIAGNOSIS — G8929 Other chronic pain: Secondary | ICD-10-CM

## 2021-10-29 DIAGNOSIS — M542 Cervicalgia: Secondary | ICD-10-CM

## 2021-10-29 MED ORDER — HYDROCODONE-ACETAMINOPHEN 10-325 MG PO TABS: 1.0000 | ORAL_TABLET | Freq: Two times a day (BID) | ORAL | 0 refills | Status: DC

## 2021-10-31 ENCOUNTER — Encounter: Payer: Self-pay | Admitting: Family Medicine

## 2021-10-31 DIAGNOSIS — G8929 Other chronic pain: Secondary | ICD-10-CM

## 2021-10-31 MED ORDER — HYDROCODONE-ACETAMINOPHEN 10-325 MG PO TABS: 1.0000 | ORAL_TABLET | Freq: Two times a day (BID) | ORAL | 0 refills | Status: DC

## 2021-10-31 NOTE — Telephone Encounter (Signed)
[facility] has not stock of pain medication. I called them and cancelled prescription.  ? ?[facility] on [address] has sufficient stock.  ? ?Please resend. ?

## 2021-11-15 ENCOUNTER — Other Ambulatory Visit: Payer: Self-pay

## 2021-11-15 ENCOUNTER — Ambulatory Visit: Payer: BC Managed Care – PPO | Admitting: Family Medicine

## 2021-11-15 ENCOUNTER — Encounter: Payer: Self-pay | Admitting: Family Medicine

## 2021-11-15 VITALS — BP 132/73 | HR 89 | Wt 185.8 lb

## 2021-11-15 DIAGNOSIS — M542 Cervicalgia: Secondary | ICD-10-CM | POA: Diagnosis not present

## 2021-11-15 DIAGNOSIS — G8929 Other chronic pain: Secondary | ICD-10-CM | POA: Diagnosis not present

## 2021-11-15 DIAGNOSIS — M7062 Trochanteric bursitis, left hip: Secondary | ICD-10-CM

## 2021-11-15 MED ORDER — HYDROCODONE-ACETAMINOPHEN 10-325 MG PO TABS: 1.0000 | ORAL_TABLET | Freq: Two times a day (BID) | ORAL | 0 refills | Status: AC

## 2021-11-15 NOTE — Patient Instructions (Signed)
It was great to see you again today! ? ?See exercises below ?Refilled medication for you ?Follow up in 3 months, sooner if needed ? ?Be well, ?Dr. [name]  ? ?Hip Bursitis ?Hip bursitis is the inflammation of one or more bursae in the hip joint. Bursae are small fluid-filled sacs that absorb shock and prevent bones from rubbing against each other. ?Hip bursitis can cause mild to moderate pain, and symptoms often come and go over time. ?What are the causes? ?This condition results from increased friction between the hip bones and the tendons around the hip joint. This condition can happen if you: ?Overuse your hip muscles. ?Injure your hip. ?Have weak buttocks muscles. ?Have bone spurs. ?Have an infection. ?In some cases, the cause may not be known. ?What increases the risk? ?You are more likely to develop this condition if: ?You injured your hip previously or had hip surgery. ?You have a medical condition, such as arthritis, gout, diabetes, or thyroid disease. ?You have spine problems. ?You have one leg that is shorter than the other. ?You participate in athletic activities that include repetitive motion, like running. ?You participate in sports where there is a risk of injury or falling, such as football, martial arts, or skiing. ?What are the signs or symptoms? ?Symptoms may come and go, and they often include: ?Pain in the hip or groin area. Pain may get worse with movement. ?Tenderness and swelling of the hip. ?In rare cases, the bursa may become infected. If this happens, you may get a fever, as well as warmth and redness in the hip area. ?How is this diagnosed? ?This condition may be diagnosed based on: ?Your symptoms. ?Your medical history. ?A physical exam. ?Imaging tests, such as: ?X-rays to check your bones. ?MRI or ultrasound to check your tendons and muscles. ?Bone scan. ?A biopsy to remove fluid from your inflamed bursa for testing. ?How is this treated? ?This condition is treated by resting, icing,  applying pressure (compression), and raising (elevating) the injured area. This is called RICE treatment. ?In some cases, RICE treatment may not be enough to make your symptoms go away. Treatment may also include: ?Taking medicine to help with swelling and pain. ?Using crutches, a cane, or a walker to decrease the strain on your hip. ?Getting a shot of cortisone medicine to help reduce swelling. ?Taking other medicines if the bursa is infected. ?Draining fluid out of the bursa to help relieve swelling. ?Having surgery to remove a damaged or infected bursa. This is rare. ?Long-term treatment may include: ?Physical therapy exercises for strength and flexibility. ?Lifestyle changes, such as weight loss, to reduce the strain on the hip. ?Follow these instructions at home: ?Managing pain, stiffness, and swelling ?  ?If directed, put ice on the painful area. ?Put ice in a plastic bag. ?Place a towel between your skin and the bag. ?Leave the ice on for 20 minutes, 2-3 times a day. ?Raise (elevate) your hip as much as you can without pain. To do this, put a pillow under your hips while you lie down. ?If directed, apply heat to the affected area as often as told by your health care provider. Use the heat source that your health care provider recommends, such as a moist heat pack or a heating pad. ?Place a towel between your skin and the heat source. ?Leave the heat on for 20-30 minutes. ?Remove the heat if your skin turns bright red. This is especially important if you are unable to feel pain, heat, or cold.  You may have a greater risk of getting burned. ?Activity ?Do not use your hip to support your body weight until your health care provider says that you can. Use crutches, a cane, or a walker as told by your health care provider. ?If the affected leg is one that you use to drive, ask your health care provider if it is safe to drive. ?Rest and protect your hip as much as possible until your pain and swelling get  better. ?Return to your normal activities as told by your health care provider. Ask your health care provider what activities are safe for you. ?Do exercises as told by your health care provider. ?General instructions ?Take over-the-counter and prescription medicines only as told by your health care provider. ?Gently massage and stretch your injured area as often as is comfortable. ?Wear compression wraps only as told by your health care provider. ?If one of your legs is shorter than the other, get fitted for a shoe insert or orthotic. ?Maintain a healthy weight. Follow instructions from your health care provider for weight control. These may include dietary restrictions. ?Keep all follow-up visits as told by your health care provider. This is important. ?How is this prevented? ?Exercise regularly, as told by your health care provider. ?Wear supportive footwear that is appropriate for your sport. ?Warm up and stretch before being active. Cool down and stretch after being active. ?Take breaks regularly from repetitive activity. ?If an activity irritates your hip or causes pain, avoid the activity as much as possible. ?Avoid sitting down for long periods at a time. ?Where to find more information ?American Academy of Orthopaedic Surgeons: orthoinfo.aaos.org ?Contact a health care provider if: ?You have a fever. ?You develop new symptoms. ?You have trouble walking or doing everyday activities. ?You have pain that gets worse or does not get better with medicine. ?You develop red skin or a feeling of warmth in your hip area. ?Get help right away if: ?You cannot move your hip. ?You have severe pain. ?You cannot control the muscles in your feet. ?Summary ?Hip bursitis is the inflammation of one or more bursae in the hip joint. Bursae are small fluid-filled sacs that absorb shock and prevent bones from rubbing against each other. ?Hip bursitis can cause hip or groin pain, and symptoms often come and go over time. ?This  condition is often treated by resting, icing, applying pressure (compression), and raising (elevating) the injured area. Other treatments may be needed. ?This information is not intended to replace advice given to you by your health care provider. Make sure you discuss any questions you have with your health care provider. ?Document Revised: 06/07/2019 Document Reviewed: 04/13/2018 ?Elsevier Patient Education ? [place]. ? ?Hip Bursitis Rehab ?Ask your health care provider which exercises are safe for you. Do exercises exactly as told by your health care provider and adjust them as directed. It is normal to feel mild stretching, pulling, tightness, or discomfort as you do these exercises. Stop right away if you feel sudden pain or your pain gets worse. Do not begin these exercises until told by your health care provider. ?Stretching exercise ?This exercise warms up your muscles and joints and improves the movement and flexibility of your hip. This exercise also helps to relieve pain and stiffness. ?Iliotibial band stretch ?An iliotibial band is a strong band of muscle tissue that runs from the outer side of your hip to the outer side of your thigh and knee. ?Lie on your side with your left /  right leg in the top position. ?Bend your left / right knee and grab your ankle. Stretch out your bottom arm to help you balance. ?Slowly bring your knee back so your thigh is behind your body. ?Slowly lower your knee toward the floor until you feel a gentle stretch on the outside of your left / right thigh. If you do not feel a stretch and your knee will not fall farther, place the heel of your other foot on top of your knee and pull your knee down toward the floor with your foot. ?Hold this position for __________ seconds. ?Slowly return to the starting position. ?Repeat __________ times. Complete this exercise __________ times a day. ?Strengthening exercises ?These exercises build strength and endurance in your hip  and pelvis. Endurance is the ability to use your muscles for a long time, even after they get tired. ?Bridge ?This exercise strengthens the muscles that move your thigh backward (hip extensors). ?Lie on your back on a

## 2021-11-15 NOTE — Progress Notes (Signed)
?  Date of Visit: 11/15/2021  ? ?SUBJECTIVE:  ? ?HPI: ? ?Sandra Briggs presents today for follow up of chronic pain. ? ?Chronic neck pain - taking norco 10-'325mg'$  one tablet twice daily. Reports pain improved with this medication. Helps her with function. Storing medication safely. No adverse side effects. No difficulty controlling how often she's taking it. Does report she has never done a formal course of physical therapy but is open to this.  ? ?L hip pain - having pain over lateral aspect of L hip, overlying greater trochanter. Worse with laying on that side. Has some tingling down her leg but no true numbness. ? ?OBJECTIVE:  ? ?BP 132/73   Pulse 89   Wt 185 lb 12.8 oz (84.3 kg)   SpO2 98%   BMI 31.89 kg/m?  ?Gen: no acute distress, pleasan tcooperative ?HEENT: normocephalic, atraumatic. Full ROM of neck. ?Ext: grip 4/5 on L, 5/5 on R. +tenderness over L greater trochanter. Full strength with hip flexion/extension/abduction/adduction bilaterally. ? ?ASSESSMENT/PLAN:  ? ?Health maintenance:  ?-UTD on HM items ? ?Encounter for chronic pain management ?Stable. PDMP reviewed with appropriate findings. ?Refills x3 months sent in. Continue present dose. ?Refer to physical therapy for formal course of therapy in the event it helps with her pain. ? ?Trochanteric bursitis of L hip ?Given exercises ?Discussed potential role of steroid injection ?Trial of home exercise program to start ? ?FOLLOW UP: ?Follow up in 3 months, sooner if needed ? ?Evans. [name], MD ?[facility] ?

## 2021-11-18 NOTE — Assessment & Plan Note (Addendum)
Stable. PDMP reviewed with appropriate findings. ?Refills x3 months sent in. Continue present dose. ?Refer to physical therapy for formal course of therapy in the event it helps with her pain. ?

## 2021-11-22 ENCOUNTER — Encounter: Payer: Self-pay | Admitting: Physical Therapy

## 2021-11-22 ENCOUNTER — Ambulatory Visit: Payer: BC Managed Care – PPO | Attending: Family Medicine | Admitting: Physical Therapy

## 2021-11-22 ENCOUNTER — Other Ambulatory Visit: Payer: Self-pay

## 2021-11-22 DIAGNOSIS — M542 Cervicalgia: Secondary | ICD-10-CM | POA: Diagnosis not present

## 2021-11-22 DIAGNOSIS — M6281 Muscle weakness (generalized): Secondary | ICD-10-CM | POA: Insufficient documentation

## 2021-11-22 DIAGNOSIS — G8929 Other chronic pain: Secondary | ICD-10-CM | POA: Diagnosis not present

## 2021-11-22 DIAGNOSIS — M25552 Pain in left hip: Secondary | ICD-10-CM | POA: Insufficient documentation

## 2021-11-22 NOTE — Patient Instructions (Signed)
Access Code: [place] ?URL: [email or web address] ?Date: 11/22/2021 ?Prepared by: Hilda Blades ? ?Exercises ?- Supine Cervical Retraction with Towel  - 2 x daily - 7 x weekly - 10 reps - 5 seconds hold ?- Clam with Resistance  - 1 x daily - 7 x weekly - 2 sets - 10 reps ?- Seated Assisted Cervical Rotation with Towel  - 2 x daily - 7 x weekly - 10 reps ?
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
## 2021-11-26 NOTE — Therapy (Signed)
?OUTPATIENT PHYSICAL THERAPY TREATMENT NOTE ? ? ?Patient Name: Sandra Briggs ?MRN: [id number] ?DOB:05/02/1976, 46 y.o., female ?Today's Date: 11/27/2021 ? ?PCP: Leeanne Rio, MD ?REFERRING PROVIDER: Leeanne Rio, MD ? ?END OF SESSION:  ? PT End of Session - 11/27/21 1332   ? ? Visit Number 2   ? Number of Visits 8   ? Date for PT Re-Evaluation 01/17/22   ? Authorization Type BCBS   ? PT Start Time 1331   ? PT Stop Time 0177   ? PT Time Calculation (min) 44 min   ? Activity Tolerance Patient tolerated treatment well   ? Behavior During Therapy [facility] for tasks assessed/performed   ? ?  ?  ? ?  ? ? ?Past Medical History:  ?Diagnosis Date  ? Anxiety   ? Chronic pain   ? flank  ? Complication of anesthesia   ? Dysuria-frequency syndrome   ? Exercise-induced asthma   ? Panic disorder   ? Sprain of MCL (medial collateral ligament) of knee   ? ?Past Surgical History:  ?Procedure Laterality Date  ? APPENDECTOMY    ? CESAREAN SECTION    ? CRYOABLATION N/A   ? ENDOMETRIAL ABLATION  2009  ? novasure  ? TUBAL LIGATION  06/28/2005  ? [place] EXTRACTION  2000  ? ?Patient Active Problem List  ? Diagnosis Date Noted  ? Dyspareunia in female 08/06/2021  ? Healthcare maintenance 11/28/2020  ? Rash and nonspecific skin eruption 11/19/2017  ? Postcoital bleeding 08/18/2017  ? Hand pain 08/18/2017  ? Conjunctivitis 04/11/2016  ? Encounter for chronic pain management 09/04/2014  ? Shortness of breath 03/04/2014  ? Pseudogout 12/30/2013  ? Overweight (BMI 25.0-29.9) 02/27/2012  ? Cervicalgia 09/07/2010  ? Bipolar disorder ([place]) 07/02/2007  ? PANIC DISORDER 06/08/2007  ? Allergic rhinitis 10/16/2006  ? ? ?REFERRING DIAG: Chronic neck pain ? ?THERAPY DIAG:  ?Cervicalgia ? ?Pain in left hip ? ?Muscle weakness (generalized) ? ?PERTINENT HISTORY: None ? ?PRECAUTIONS: None ? ?SUBJECTIVE: "i've done all the exercises, just not consecutively." ? ?PAIN:  ?Are you having pain? Yes: NPRS scale: Last took meds for pain before tx  5/10 ?Pain location: bil upper trap ?Pain description: aching/ sore, tight ?Aggravating factors: rolling  ?Relieving factors: exercise/ stretching ? ? ? ? ? ?(OBJECTIVE:  ?DIAGNOSTIC FINDINGS:  ?Cervical MRI 2016: Mild lower cervical disc degeneration resulting in mild spinal stenosis at C5-6, unchanged ?  ?PATIENT SURVEYS:  ?FOTO 56% ?  ?COGNITION: ?Overall cognitive status: Within functional limits for tasks assessed ?  ?SENSATION: ?WFL ?  ?POSTURE:  ?Rounded shoulders and forward head posture ?  ? PALPATION: ?Tender to palpation right > left upper trap            ?  ?CERVICAL ROM:  ?  ?Active ROM A/PROM (deg) ?11/22/2021  ?Flexion 50  ?Extension 50  ?Right lateral flexion 30  ?Left lateral flexion 25*  ?Right rotation 65  ?Left rotation 70  ?*right upper trap tightness ?  ?UE ROM: ?                      UE AROM grossly WFL and non-painful ?  ?UE MMT: ?  ?MMT Right ?11/22/2021 Left ?11/22/2021  ?Shoulder flexion 5 5  ?Shoulder abduction 5 5  ?Shoulder extension 5 5  ?Shoulder external rotation 5 5  ?Periscapular muscles Grossly 4/5  ?Grip strength 61, 56, 60 35, 30, 42  ?  ?LE MMT: ?  ?MMT Right ?11/22/2021  Left ?11/22/2021  ?Hip flexion 4+ 4+  ?Hip extension 4 4-  ?Hip abduction 4 4-  ?Knee flexion 5 4+  ?Knee extension 5 4+  ?  ?SPECIAL TESTS:  ?Radicular testing for cervical and lumbar grossly negative ?  ?FUNCTIONAL TESTS:  ?DNF Endurance: 13 seconds ?  ?TODAY'S TREATMENT:  ? ?[place] Adult PT Treatment:                                                DATE: 11/27/2021 ?Therapeutic Exercise: ?Upper trap stretch 1 x 30 bil ?Levator scapulae stretch 1 x 30 sec bil ?Seated SNAG reviewed with cues for proper form ?Nu-step L 5 x 5 min UE/LE ? ?Manual Therapy: ?IASTM upper trap bil, L glute med ?T1-T7 PA grade IV with cavitation noted ? ?Trigger Point Dry-Needling  ?Treatment instructions: Expect mild to moderate muscle soreness. S/S of pneumothorax if dry needled over a lung field, and to seek immediate medical attention should  they occur. Patient verbalized understanding of these instructions and education. ? ?Patient Consent Given: Yes ?Education handout provided: Yes ?Muscles treated: bil upper trap, L glute med ?Electrical stimulation performed: No ?Parameters: N/A ?Treatment response/outcome: twitch noted and lengthening.  ? ? ? ?Performed at evaluation: ?Supine cervical retraction with small towel roll under neck 10 x 5 sec ?Cervical rotational SNAG with towel x 5 each ?Side clamshell with red x 10 ?  ?PATIENT EDUCATION:  ?Education details: Exam findings, POC, HEP ?Person educated: Patient ?Education method: Explanation, Demonstration, Tactile cues, Verbal cues, and Handouts ?Education comprehension: verbalized understanding, returned demonstration, verbal cues required, tactile cues required, and needs further education ?  ?HOME EXERCISE PROGRAM: ?Access Code: [place] ?  ?  ?ASSESSMENT: ?CLINICAL IMPRESSION: ?Pt arrives to session for her follow up visit and rpeorts she has been consistent with her HEP and reports she is feeling alittle better. Educated and consent was given for TPDN focusing on bil upper trap and L glute med followed with IASTM techniques and thoracic mobs. Reviewed Her HEP, more specifically her Snags which she did required some cues for proper form. Updated HEP today include levator scapulae stretch. End of session she noted improvement in cervical mobility with some residual soreness likely from the DN in the R upper trap.  ?  ?  ?OBJECTIVE IMPAIRMENTS decreased activity tolerance, decreased ROM, decreased strength, postural dysfunction, and pain.  ?  ?ACTIVITY LIMITATIONS cleaning, driving, meal prep, occupation, and shopping.  ?  ?PERSONAL FACTORS Fitness, Past/current experiences, and Time since onset of injury/illness/exacerbation are also affecting patient's functional outcome.  ?  ?  ?REHAB POTENTIAL: Good ?  ?CLINICAL DECISION MAKING: Stable/uncomplicated ?  ?EVALUATION COMPLEXITY: Low ?  ?   ?GOALS: ?Goals reviewed with patient? Yes ?  ?SHORT TERM GOALS: Target date: 12/20/2021 ?  ?Patient will be I with initial HEP in order to progress with therapy. ?Baseline: HEP issued at eval ?Goal status: INITIAL ?  ?2.  PT will review FOTO with patient by 3rd visit in order to understand expected progress and outcome with therapy. ?Baseline: FOTO assessed at eval ?Goal status: INITIAL ?  ?3.  Patient will improve left cervical rotation to 70 deg without sharp pain at end range to improve driving ?Baseline: patient reports sharp pain at end range with limitation ?Goal status: INITIAL ?  ?LONG TERM GOALS: Target date: 01/17/2022 ?  ?Patient will be  I with final HEP to maintain progress from PT. ?Baseline: HEP issued at eval ?Goal status: INITIAL ?  ?2.  Patient will report >/= 65% status on FOTO to indicate improved functional ability. ?Baseline: 56% ?Goal status: INITIAL ?  ?3.  Patient will demonstrate left hip weakness >/= 4/5 MMT to improve activity tolerance and reduce pain ?Baseline: 4-/5 MMT ?Goal status: INITIAL ?  ?4.  Patient will demonstrate DNF endurance >/= 25 seconds to improve cervical control and reduce pain ?Baseline: DNF endurance 13 seconds ?Goal status: INITIAL ?  ?  ?PLAN: ?PT FREQUENCY: 1x/week ?  ?PT DURATION: 8 weeks ?  ?PLANNED INTERVENTIONS: Therapeutic exercises, Therapeutic activity, Neuromuscular re-education, Balance training, Gait training, Patient/Family education, Joint manipulation, Joint mobilization, Aquatic Therapy, Dry Needling, Electrical stimulation, Spinal manipulation, Spinal mobilization, Cryotherapy, Moist heat, Taping, and Manual therapy ?  ?PLAN FOR NEXT SESSION: Review HEP and progress PRN, manual/dry needling for upper traps and possible glute med, continue cervical stretching and mobs, postural control and strengthening, hip strengthening, response to TPDN.  ? ? ?Starr [place] PT, DPT, LAT, ATC  ?11/27/21  2:19 PM ? ? ? ? ? ?  ? ?

## 2021-11-27 ENCOUNTER — Encounter: Payer: Self-pay | Admitting: Physical Therapy

## 2021-11-27 ENCOUNTER — Ambulatory Visit: Payer: BC Managed Care – PPO | Admitting: Physical Therapy

## 2021-11-27 DIAGNOSIS — M6281 Muscle weakness (generalized): Secondary | ICD-10-CM | POA: Diagnosis not present

## 2021-11-27 DIAGNOSIS — G8929 Other chronic pain: Secondary | ICD-10-CM | POA: Diagnosis not present

## 2021-11-27 DIAGNOSIS — M542 Cervicalgia: Secondary | ICD-10-CM

## 2021-11-27 DIAGNOSIS — M25552 Pain in left hip: Secondary | ICD-10-CM | POA: Diagnosis not present

## 2021-12-03 NOTE — Therapy (Incomplete)
?OUTPATIENT PHYSICAL THERAPY TREATMENT NOTE ? ? ?Patient Name: Sandra Briggs ?MRN: [id number] ?DOB:04-Jun-1976, 46 y.o., female ?Today's Date: 12/03/2021 ? ?PCP: Leeanne Rio, MD ?REFERRING PROVIDER: Leeanne Rio, MD ? ? ? ? ?Past Medical History:  ?Diagnosis Date  ? Anxiety   ? Chronic pain   ? flank  ? Complication of anesthesia   ? Dysuria-frequency syndrome   ? Exercise-induced asthma   ? Panic disorder   ? Sprain of MCL (medial collateral ligament) of knee   ? ?Past Surgical History:  ?Procedure Laterality Date  ? APPENDECTOMY    ? CESAREAN SECTION    ? CRYOABLATION N/A   ? ENDOMETRIAL ABLATION  2009  ? novasure  ? TUBAL LIGATION  06/28/2005  ? [place] EXTRACTION  2000  ? ?Patient Active Problem List  ? Diagnosis Date Noted  ? Dyspareunia in female 08/06/2021  ? Healthcare maintenance 11/28/2020  ? Rash and nonspecific skin eruption 11/19/2017  ? Postcoital bleeding 08/18/2017  ? Hand pain 08/18/2017  ? Conjunctivitis 04/11/2016  ? Encounter for chronic pain management 09/04/2014  ? Shortness of breath 03/04/2014  ? Pseudogout 12/30/2013  ? Overweight (BMI 25.0-29.9) 02/27/2012  ? Cervicalgia 09/07/2010  ? Bipolar disorder ([place]) 07/02/2007  ? PANIC DISORDER 06/08/2007  ? Allergic rhinitis 10/16/2006  ? ? ?REFERRING DIAG: Chronic neck pain ? ?THERAPY DIAG:  ?No diagnosis found. ? ?PERTINENT HISTORY: None ? ?PRECAUTIONS: None ? ?SUBJECTIVE: "i've done all the exercises, just not consecutively." ? ?PAIN:  ?Are you having pain? Yes:  ?NPRS scale: 3-4/10 (with medication), can get 5-6/10 without medications ?Pain location: Neck ?Pain description: Constant, sharp, achy, throbbing ?Aggravating factors: Turning neck, looking up and down ?Relieving factors: Ice and heat, rest, medication ?  ?Are you having pain? Yes:  ?NPRS scale: 0/10 (3-4/5 when it occurs) ?Pain location: Left hip, lateral ?Pain description: Intermittent, sharp ?Aggravating factors: Activity or twisting that can cause it to  hurt ?Relieving factors: Sit/rest, medication, hot bath ? ?PATIENT GOALS: Pain relief and prevention ? ? ?OBJECTIVE:  ?PATIENT SURVEYS:  ?FOTO 56% ?  ?POSTURE:  ?Rounded shoulders and forward head posture ?  ? PALPATION: ?Tender to palpation right > left upper trap            ?  ?CERVICAL ROM:  ?  ?Active ROM A/PROM (deg) ?11/22/2021  ?Flexion 50  ?Extension 50  ?Right lateral flexion 30  ?Left lateral flexion 25*  ?Right rotation 65  ?Left rotation 70  ?*right upper trap tightness ?  ?UE ROM: ?                      UE AROM grossly WFL and non-painful ?  ?UE MMT: ?  ?MMT Right ?11/22/2021 Left ?11/22/2021  ?Shoulder flexion 5 5  ?Shoulder abduction 5 5  ?Shoulder extension 5 5  ?Shoulder external rotation 5 5  ?Periscapular muscles Grossly 4/5  ?Grip strength 61, 56, 60 35, 30, 42  ?  ?LE MMT: ?  ?MMT Right ?11/22/2021 Left ?11/22/2021  ?Hip flexion 4+ 4+  ?Hip extension 4 4-  ?Hip abduction 4 4-  ?Knee flexion 5 4+  ?Knee extension 5 4+  ?  ?FUNCTIONAL TESTS:  ?DNF Endurance: 13 seconds ?  ? ?TODAY'S TREATMENT:  ?[place] Adult PT Treatment:  DATE: 12/05/2021 ?Therapeutic Exercise: ?*** ?Manual Therapy: ?*** ? ? ?[place] Adult PT Treatment:                                                DATE: 11/27/2021 ?Therapeutic Exercise: ?Upper trap stretch 1 x 30 bil ?Levator scapulae stretch 1 x 30 sec bil ?Seated SNAG reviewed with cues for proper form ?Nu-step L 5 x 5 min UE/LE ?Manual Therapy: ?IASTM upper trap bil, L glute med ?T1-T7 PA grade IV with cavitation noted ?Trigger Point Dry-Needling  ?Treatment instructions: Expect mild to moderate muscle soreness. S/S of pneumothorax if dry needled over a lung field, and to seek immediate medical attention should they occur. Patient verbalized understanding of these instructions and education. ?Patient Consent Given: Yes ?Education handout provided: Yes ?Muscles treated: bil upper trap, L glute med ?Electrical stimulation performed: No ?Parameters:  N/A ?Treatment response/outcome: twitch noted and lengthening.  ? ?[place] Adult PT Treatment:                                                DATE: 11/22/2021 ?Therapeutic Exercise: ?Supine cervical retraction with small towel roll under neck 10 x 5 sec ?Cervical rotational SNAG with towel x 5 each ?Side clamshell with red x 10 ?  ?PATIENT EDUCATION:  ?Education details: HEP ?Person educated: Patient ?Education method: Explanation, Demonstration, Tactile cues, Verbal cues, and Handouts ?Education comprehension: verbalized understanding, returned demonstration, verbal cues required, tactile cues required, and needs further education ?  ?HOME EXERCISE PROGRAM: ?Access Code: [place] ?  ?  ?ASSESSMENT: ?CLINICAL IMPRESSION: ?Patient tolerated therapy well with no adverse effects. *** ? ?Pt arrives to session for her follow up visit and rpeorts she has been consistent with her HEP and reports she is feeling alittle better. Educated and consent was given for TPDN focusing on bil upper trap and L glute med followed with IASTM techniques and thoracic mobs. Reviewed Her HEP, more specifically her Snags which she did required some cues for proper form. Updated HEP today include levator scapulae stretch. End of session she noted improvement in cervical mobility with some residual soreness likely from the DN in the R upper trap.  ?  ?  ?OBJECTIVE IMPAIRMENTS decreased activity tolerance, decreased ROM, decreased strength, postural dysfunction, and pain.  ?  ?ACTIVITY LIMITATIONS cleaning, driving, meal prep, occupation, and shopping.  ?  ?PERSONAL FACTORS Fitness, Past/current experiences, and Time since onset of injury/illness/exacerbation are also affecting patient's functional outcome.  ?  ?  ?GOALS: ?Goals reviewed with patient? Yes ?  ?SHORT TERM GOALS: Target date: 12/20/2021 ?  ?Patient will be I with initial HEP in order to progress with therapy. ?Baseline: HEP issued at eval ?Goal status: INITIAL ?  ?2.  PT will review FOTO  with patient by 3rd visit in order to understand expected progress and outcome with therapy. ?Baseline: FOTO assessed at eval ?Goal status: INITIAL ?  ?3.  Patient will improve left cervical rotation to 70 deg without sharp pain at end range to improve driving ?Baseline: patient reports sharp pain at end range with limitation ?Goal status: INITIAL ?  ?LONG TERM GOALS: Target date: 01/17/2022 ?  ?Patient will be I with final HEP to maintain progress from PT. ?Baseline:  HEP issued at eval ?Goal status: INITIAL ?  ?2.  Patient will report >/= 65% status on FOTO to indicate improved functional ability. ?Baseline: 56% ?Goal status: INITIAL ?  ?3.  Patient will demonstrate left hip weakness >/= 4/5 MMT to improve activity tolerance and reduce pain ?Baseline: 4-/5 MMT ?Goal status: INITIAL ?  ?4.  Patient will demonstrate DNF endurance >/= 25 seconds to improve cervical control and reduce pain ?Baseline: DNF endurance 13 seconds ?Goal status: INITIAL ?  ?  ?PLAN: ?PT FREQUENCY: 1x/week ?  ?PT DURATION: 8 weeks ?  ?PLANNED INTERVENTIONS: Therapeutic exercises, Therapeutic activity, Neuromuscular re-education, Balance training, Gait training, Patient/Family education, Joint manipulation, Joint mobilization, Aquatic Therapy, Dry Needling, Electrical stimulation, Spinal manipulation, Spinal mobilization, Cryotherapy, Moist heat, Taping, and Manual therapy ?  ?PLAN FOR NEXT SESSION: Review HEP and progress PRN, manual/dry needling for upper traps and possible glute med, continue cervical stretching and mobs, postural control and strengthening, hip strengthening, response to TPDN.  ? ? ?Hilda Blades, PT, DPT, LAT, ATC ?12/03/21  4:10 PM ?Phone: [phone] ?Fax: [phone] ? ? ? ? ? ? ?  ? ?

## 2021-12-05 ENCOUNTER — Ambulatory Visit: Payer: BC Managed Care – PPO | Admitting: Physical Therapy

## 2021-12-11 NOTE — Therapy (Signed)
?OUTPATIENT PHYSICAL THERAPY TREATMENT NOTE ? ? ?Patient Name: Sandra Briggs ?MRN: [id number] ?DOB:07-Aug-1976, 46 y.o., female ?Today's Date: 12/12/2021 ? ?PCP: Leeanne Rio, MD ?REFERRING PROVIDER: Leeanne Rio, MD ? ? PT End of Session - 12/12/21 1533   ? ? Visit Number 3   ? Number of Visits 8   ? Date for PT Re-Evaluation 01/17/22   ? Authorization Type BCBS   ? PT Start Time 1530   ? PT Stop Time 1615   ? PT Time Calculation (min) 45 min   ? Activity Tolerance Patient tolerated treatment well   ? Behavior During Therapy [facility] for tasks assessed/performed   ? ?  ?  ? ?  ? ? ? ?Past Medical History:  ?Diagnosis Date  ? Anxiety   ? Chronic pain   ? flank  ? Complication of anesthesia   ? Dysuria-frequency syndrome   ? Exercise-induced asthma   ? Panic disorder   ? Sprain of MCL (medial collateral ligament) of knee   ? ?Past Surgical History:  ?Procedure Laterality Date  ? APPENDECTOMY    ? CESAREAN SECTION    ? CRYOABLATION N/A   ? ENDOMETRIAL ABLATION  2009  ? novasure  ? TUBAL LIGATION  06/28/2005  ? [place] EXTRACTION  2000  ? ?Patient Active Problem List  ? Diagnosis Date Noted  ? Dyspareunia in female 08/06/2021  ? Healthcare maintenance 11/28/2020  ? Rash and nonspecific skin eruption 11/19/2017  ? Postcoital bleeding 08/18/2017  ? Hand pain 08/18/2017  ? Conjunctivitis 04/11/2016  ? Encounter for chronic pain management 09/04/2014  ? Shortness of breath 03/04/2014  ? Pseudogout 12/30/2013  ? Overweight (BMI 25.0-29.9) 02/27/2012  ? Cervicalgia 09/07/2010  ? Bipolar disorder ([place]) 07/02/2007  ? PANIC DISORDER 06/08/2007  ? Allergic rhinitis 10/16/2006  ? ? ?REFERRING DIAG: Chronic neck pain ? ?THERAPY DIAG:  ?Cervicalgia ? ?Pain in left hip ? ?Muscle weakness (generalized) ? ?PERTINENT HISTORY: None ? ?PRECAUTIONS: None ? ?SUBJECTIVE: Patient reports she is hurting today. Her left neck hurts a sharp pain when she bends forward and she is having pain pain when she raises her arms up. She does  state her hip is feeling a lot better.  ? ?PAIN:  ?Are you having pain? Yes:  ?NPRS scale: 0/10 when sitting still, 5-6/10 when bending neck forward ?Pain location: Neck ?Pain description: Constant, sharp, achy, throbbing ?Aggravating factors: Turning neck, looking up and down ?Relieving factors: Ice and heat, rest, medication ?  ?Are you having pain? Yes:  ?NPRS scale: 0/10 (3-4/5 when it occurs) ?Pain location: Left hip, lateral ?Pain description: Intermittent, sharp ?Aggravating factors: Activity or twisting that can cause it to hurt ?Relieving factors: Sit/rest, medication, hot bath ? ?PATIENT GOALS: Pain relief and prevention ? ? ?OBJECTIVE:  ?PATIENT SURVEYS:  ?FOTO 56% ?  ?POSTURE:  ?Rounded shoulders and forward head posture ?  ? PALPATION: ?Tender to palpation right > left upper trap            ?  ?CERVICAL ROM:  ?  ?Active ROM A/PROM (deg) ?11/22/2021  ? ?12/12/2021  ?Flexion 50   ?Extension 50   ?Right lateral flexion 30   ?Left lateral flexion 25*   ?Right rotation 65 70  ?Left rotation 70 70  ?*right upper trap tightness ?  ?UE ROM: ?                      UE AROM grossly WFL and non-painful ?  ?  UE MMT: ?  ?MMT Right ?11/22/2021 Left ?11/22/2021  ?Shoulder flexion 5 5  ?Shoulder abduction 5 5  ?Shoulder extension 5 5  ?Shoulder external rotation 5 5  ?Periscapular muscles Grossly 4/5  ?Grip strength 61, 56, 60 35, 30, 42  ?  ?LE MMT: ?  ?MMT Right ?11/22/2021 Left ?11/22/2021  ?Hip flexion 4+ 4+  ?Hip extension 4 4-  ?Hip abduction 4 4-  ?Knee flexion 5 4+  ?Knee extension 5 4+  ?  ?FUNCTIONAL TESTS:  ?DNF Endurance: 13 seconds ?  ? ?TODAY'S TREATMENT:  ?[facility] Adult PT Treatment:                                                DATE: 12/12/2021 ?Therapeutic Exercise: ?NuStep L5 x 5 min with UE/LE while taking subjective ?Supine cervical retraction 10 x 5 sec ?Sidelying thoracic rotation x 10 each ?Double ER and scap retraction with red x 10 ?Row with red x 10 ?Manual Therapy: ?Skilled palpation and monitoring of muscle  tension while performing TPDN treatment ?Suboccipital release with gentle manual traction ?Passive upper trap and levator scap stretch ? Trigger Point Dry Needling Treatment: ?Pre-treatment instruction: Patient instructed on dry needling rationale, procedures, and possible side effects including pain during treatment (achy,cramping feeling), bruising, drop of blood, lightheadedness, nausea, sweating. ?Patient Consent Given: Yes ?Education handout provided: Previously provided ?Muscles treated: Bilateral upper trap, left levator scap, left suboccipitals  ?Needle size and number: .30x20m x 6 ?Electrical stimulation performed: No ?Parameters: N/A ?Treatment response/outcome: Twitch response elicited, Palpable decrease in muscle tension, and report of reduced muscle tension ?Post-treatment instructions: Patient instructed to expect possible mild to moderate muscle soreness later today and/or tomorrow. Patient instructed in methods to reduce muscle soreness and to continue prescribed HEP. If patient was dry needled over the lung field, patient was instructed on signs and symptoms of pneumothorax and, however unlikely, to see immediate medical attention should they occur. Patient was also educated on signs and symptoms of infection and to seek medical attention should they occur. Patient verbalized understanding of these instructions and education. ? ? [facility]                                                [facility] ?Therapeutic Exercise: ?Upper trap stretch 1 x 30 bil ?Levator scapulae stretch 1 x 30 sec bil ?Seated SNAG reviewed with cues for proper form ?Nu-step L 5 x 5 min UE/LE ?Manual Therapy: ?IASTM upper trap bil, L glute med ?T1-T7 PA grade IV with cavitation noted ?Trigger Point Dry-Needling  ?Treatment instructions: Expect mild to moderate muscle soreness. S/S of pneumothorax if dry needled over a lung field, and to seek immediate medical attention should they occur. Patient verbalized  understanding of these instructions and education. ?Patient Consent Given: Yes ?Education handout provided: Yes ?Muscles treated: bil upper trap, L glute med ?Electrical stimulation performed: No ?Parameters: N/A ?Treatment response/outcome: twitch noted and lengthening.  ? [facility]  DATE: 11/22[facility]20[facility] ?Therapeutic Exercise: ?Supine cervical retraction with small towel roll under neck 10 x 5 sec ?Cervical rotational SNAG with towel x 5 each ?Side clamshell with red x 10 ?  ?PATIENT EDUCATION:  ?Education details: HEP update, TPDN ?Person educated: Patient ?Education method: Explanation, Demonstration, Tactile cues, Verbal cues, Handout ?Education comprehension: verbalized understanding, returned demonstration, verbal cues required, tactile cues required, and needs further education ?  ?HOME EXERCISE PROGRAM: ?Access Code: [place] ?  ?  ?ASSESSMENT: ?CLINICAL IMPRESSION: ?Patient tolerated therapy well with no adverse effects. Continued with TPDN treatment mainly focused on the cervical spine this visit, elicited good twitch responses and therapeutic benefit post treatment. Therapy then focused on progress cervicothoracic mobility and postural control exercises. She did require occasional cueing for posture and avoid shrug. Updated HEP with banded row. Patient would benefit from continued skilled PT to progress mobility and strength in order to reduce pain and maximize functional ability. ?  ?  ?OBJECTIVE IMPAIRMENTS decreased activity tolerance, decreased ROM, decreased strength, postural dysfunction, and pain.  ?  ?ACTIVITY LIMITATIONS cleaning, driving, meal prep, occupation, and shopping.  ?  ?PERSONAL FACTORS Fitness, Past/current experiences, and Time since onset of injury/illness/exacerbation are also affecting patient's functional outcome.  ?  ?  ?GOALS: ?Goals reviewed with patient? Yes ?  ?SHORT TERM GOALS: Target date: 12/20/2021 ?  ?Patient will be  I with initial HEP in order to progress with therapy. ?Baseline: HEP issued at eval ?Goal status: INITIAL ?  ?2.  PT will review FOTO with patient by 3rd visit in order to understand expected progr

## 2021-12-12 ENCOUNTER — Encounter: Payer: Self-pay | Admitting: Physical Therapy

## 2021-12-12 ENCOUNTER — Ambulatory Visit: Payer: BC Managed Care – PPO | Admitting: Physical Therapy

## 2021-12-12 ENCOUNTER — Other Ambulatory Visit: Payer: Self-pay

## 2021-12-12 DIAGNOSIS — M25552 Pain in left hip: Secondary | ICD-10-CM | POA: Diagnosis not present

## 2021-12-12 DIAGNOSIS — M542 Cervicalgia: Secondary | ICD-10-CM | POA: Diagnosis not present

## 2021-12-12 DIAGNOSIS — G8929 Other chronic pain: Secondary | ICD-10-CM | POA: Diagnosis not present

## 2021-12-12 DIAGNOSIS — M6281 Muscle weakness (generalized): Secondary | ICD-10-CM

## 2021-12-12 NOTE — Patient Instructions (Signed)
Access Code: [place] ?URL: [email or web address] ?Date: 12/12/2021 ?Prepared by: Hilda Blades ? ?Exercises ?- Supine Cervical Retraction with Towel  - 2 x daily - 7 x weekly - 10 reps - 5 seconds hold ?- Clam with Resistance  - 1 x daily - 7 x weekly - 2 sets - 10 reps ?- Seated Assisted Cervical Rotation with Towel  - 2 x daily - 7 x weekly - 10 reps ?- Gentle Levator Scapulae Stretch  - 1 x daily - 7 x weekly - 2 sets - 2 reps - 30 seconds hold ?- Standing Bilateral Low Shoulder Row with Anchored Resistance  - 1 x daily - 7 x weekly - 2 sets - 10 reps ?

## 2021-12-18 NOTE — Therapy (Addendum)
OUTPATIENT PHYSICAL THERAPY TREATMENT NOTE  DISCHARGE   Patient Name: Sandra Briggs MRN: [id number] DOB:02-17-1976, 46 y.o., female Today's Date: 12/19/2021  PCP: Leeanne Rio, MD REFERRING PROVIDER: Leeanne Rio, MD   PT End of Session - 12/19/21 1357     Visit Number 4    Number of Visits 8    Date for PT Re-Evaluation 01/17/22    Authorization Type BCBS    PT Start Time 1400    PT Stop Time 1440    PT Time Calculation (min) 40 min    Activity Tolerance Patient tolerated treatment well    Behavior During Therapy WFL for tasks assessed/performed               Past Medical History:  Diagnosis Date   Anxiety    Chronic pain    flank   Complication of anesthesia    Dysuria-frequency syndrome    Exercise-induced asthma    Panic disorder    Sprain of MCL (medial collateral ligament) of knee    Past Surgical History:  Procedure Laterality Date   APPENDECTOMY     CESAREAN SECTION     CRYOABLATION N/A    ENDOMETRIAL ABLATION  2009   novasure   TUBAL LIGATION  06/28/2005   WISDOM TOOTH EXTRACTION  2000   Patient Active Problem List   Diagnosis Date Noted   Dyspareunia in female 08/06/2021   Healthcare maintenance 11/28/2020   Rash and nonspecific skin eruption 11/19/2017   Postcoital bleeding 08/18/2017   Hand pain 08/18/2017   Conjunctivitis 04/11/2016   Encounter for chronic pain management 09/04/2014   Shortness of breath 03/04/2014   Pseudogout 12/30/2013   Overweight (BMI 25.0-29.9) 02/27/2012   Cervicalgia 09/07/2010   Bipolar disorder ([place]) 07/02/2007   PANIC DISORDER 06/08/2007   Allergic rhinitis 10/16/2006    REFERRING DIAG: Chronic neck pain  THERAPY DIAG:  Cervicalgia  Pain in left hip  Muscle weakness (generalized)  PERTINENT HISTORY: None  PRECAUTIONS: None  SUBJECTIVE: Patient reports she is doing well with no new issues. She does feel she can turn her head better which has helped her driving.   PAIN:  Are  you having pain? Yes:  NPRS scale: 0/10 when sitting still, 5-6/10 when bending neck forward Pain location: Neck Pain description: Constant, sharp, achy, throbbing Aggravating factors: Turning neck, looking up and down Relieving factors: Ice and heat, rest, medication   Are you having pain? Yes:  NPRS scale: 0/10 (3-4/5 when it occurs) Pain location: Left hip, lateral Pain description: Intermittent, sharp Aggravating factors: Activity or twisting that can cause it to hurt Relieving factors: Sit/rest, medication, hot bath  PATIENT GOALS: Pain relief and prevention   OBJECTIVE:  PATIENT SURVEYS:  FOTO 56%   POSTURE:  Rounded shoulders and forward head posture    PALPATION: Tender to palpation right > left upper trap              CERVICAL ROM:    Active ROM A/PROM (deg) 11/22/2021   12/12/2021   12/19/2021  Flexion 50    Extension 50    Right lateral flexion 30    Left lateral flexion 25*    Right rotation 65 70 70  Left rotation 70 70 70  *no tightness reported   UE ROM:                       UE AROM grossly WFL and non-painful   UE MMT:  MMT Right 11/22/2021 Left 11/22/2021  Shoulder flexion 5 5  Shoulder abduction 5 5  Shoulder extension 5 5  Shoulder external rotation 5 5  Periscapular muscles Grossly 4/5  Grip strength 61, 56, 60 35, 30, 42    LE MMT:   MMT Right 11/22/2021 Left 11/22/2021  Hip flexion 4+ 4+  Hip extension 4 4-  Hip abduction 4 4-  Knee flexion 5 4+  Knee extension 5 4+    FUNCTIONAL TESTS:  DNF Endurance: 13 seconds    TODAY'S TREATMENT:  [facility] Adult PT Treatment:                                                DATE: 12/19/2021 Therapeutic Exercise: UBE L1 x 4 min (2 fwd/bwd) while taking subjective Supine horizontal abduction with green 2 x 10 Supine cervical retraction 10 x 5 sec Sidelying thoracic rotation x 10 each Double ER and scap retraction with green 2 x 10 Seated SNAG cervical rotation x 10 each Machine low row 20# 2 x  10 Machine lat pull down with 15# 2 x 10 Sidelying thoracic rotation x 10 each Sidelying hip abduction 2 x 10 each Bridge 2 x 10   [facility] Adult PT Treatment:                                                DATE: 12/12/2021 Therapeutic Exercise: NuStep L5 x 5 min with UE/LE while taking subjective Supine cervical retraction 10 x 5 sec Sidelying thoracic rotation x 10 each Double ER and scap retraction with red x 10 Row with red x 10 Manual Therapy: Skilled palpation and monitoring of muscle tension while performing TPDN treatment Suboccipital release with gentle manual traction Passive upper trap and levator scap stretch  Trigger Point Dry Needling Treatment: Pre-treatment instruction: Patient instructed on dry needling rationale, procedures, and possible side effects including pain during treatment (achy,cramping feeling), bruising, drop of blood, lightheadedness, nausea, sweating. Patient Consent Given: Yes Education handout provided: Previously provided Muscles treated: Bilateral upper trap, left levator scap, left suboccipitals  Needle size and number: .30x22m x 6 Electrical stimulation performed: No Parameters: N/A Treatment response/outcome: Twitch response elicited, Palpable decrease in muscle tension, and report of reduced muscle tension Post-treatment instructions: Patient instructed to expect possible mild to moderate muscle soreness later today and/or tomorrow. Patient instructed in methods to reduce muscle soreness and to continue prescribed HEP. If patient was dry needled over the lung field, patient was instructed on signs and symptoms of pneumothorax and, however unlikely, to see immediate medical attention should they occur. Patient was also educated on signs and symptoms of infection and to seek medical attention should they occur. Patient verbalized understanding of these instructions and education.  [facility]                                                 [facility]: 11/27/2021 Therapeutic Exercise: Upper trap stretch 1 x 30 bil Levator scapulae stretch 1 x 30 sec bil Seated SNAG reviewed with cues for proper form Nu-step L 5 x 5  min UE/LE Manual Therapy: IASTM upper trap bil, L glute med T1-T7 PA grade IV with cavitation noted Trigger Point Dry-Needling  Treatment instructions: Expect mild to moderate muscle soreness. S/S of pneumothorax if dry needled over a lung field, and to seek immediate medical attention should they occur. Patient verbalized understanding of these instructions and education. Patient Consent Given: Yes Education handout provided: Yes Muscles treated: bil upper trap, L glute med Electrical stimulation performed: No Parameters: N/A Treatment response/outcome: twitch noted and lengthening.    PATIENT EDUCATION:  Education details: HEP update Person educated: Patient Education method: Explanation, Demonstration, Tactile cues, Verbal cues, Handout Education comprehension: verbalized understanding, returned demonstration, verbal cues required, tactile cues required, and needs further education   HOME EXERCISE PROGRAM: Access Code: [place]     ASSESSMENT: CLINICAL IMPRESSION: Patient tolerated therapy well with no adverse effects. Therapy focused on progressing her mobility and postural strength with good tolerance. She reports improvement in her cervical symptoms with improved cervical rotation and less tightness. She does report continued sharp pain with cervical flexion. Incorporated hip and core strengthening this visit with good tolerance. Updated HEP this visit with good tolerance. Patient would benefit from continued skilled PT to progress mobility and strength in order to reduce pain and maximize functional ability.   OBJECTIVE IMPAIRMENTS decreased activity tolerance, decreased ROM, decreased strength, postural dysfunction, and pain.    ACTIVITY LIMITATIONS cleaning, driving, meal prep, occupation, and shopping.     PERSONAL FACTORS Fitness, Past/current experiences, and Time since onset of injury/illness/exacerbation are also affecting patient's functional outcome.      GOALS: Goals reviewed with patient? Yes   SHORT TERM GOALS: Target date: 12/20/2021   Patient will be I with initial HEP in order to progress with therapy. Baseline: HEP issued at eval Goal status: INITIAL   2.  PT will review FOTO with patient by 3rd visit in order to understand expected progress and outcome with therapy. Baseline: FOTO assessed at eval 12/12/2021: reviewed Goal status: MET   3.  Patient will improve left cervical rotation to 70 deg without sharp pain at end range to improve driving Baseline: patient reports sharp pain at end range with limitation Goal status: INITIAL   LONG TERM GOALS: Target date: 01/17/2022   Patient will be I with final HEP to maintain progress from PT. Baseline: HEP issued at eval Goal status: INITIAL   2.  Patient will report >/= 65% status on FOTO to indicate improved functional ability. Baseline: 56% Goal status: INITIAL   3.  Patient will demonstrate left hip weakness >/= 4/5 MMT to improve activity tolerance and reduce pain Baseline: 4-/5 MMT Goal status: INITIAL   4.  Patient will demonstrate DNF endurance >/= 25 seconds to improve cervical control and reduce pain Baseline: DNF endurance 13 seconds Goal status: INITIAL     PLAN: PT FREQUENCY: 1x/week   PT DURATION: 8 weeks   PLANNED INTERVENTIONS: Therapeutic exercises, Therapeutic activity, Neuromuscular re-education, Balance training, Gait training, Patient/Family education, Joint manipulation, Joint mobilization, Aquatic Therapy, Dry Needling, Electrical stimulation, Spinal manipulation, Spinal mobilization, Cryotherapy, Moist heat, Taping, and Manual therapy   PLAN FOR NEXT SESSION: Review HEP and progress PRN, manual/dry needling for upper traps and possible glute med, continue cervical stretching and mobs, postural  control and strengthening, hip strengthening, response to TPDN.    Hilda Blades, PT, DPT, LAT, ATC 12/19/21  2:43 PM Phone: [phone] Fax: [phone]   PHYSICAL THERAPY DISCHARGE SUMMARY  Visits from Start of Care: 4  Current  functional level related to goals / functional outcomes: See above   Remaining deficits: See above   Education / Equipment: HEP   Patient agrees to discharge. Patient goals were partially met. Patient is being discharged due to not returning since the last visit.

## 2021-12-19 ENCOUNTER — Ambulatory Visit: Payer: BC Managed Care – PPO | Attending: Family Medicine | Admitting: Physical Therapy

## 2021-12-19 ENCOUNTER — Encounter: Payer: Self-pay | Admitting: Physical Therapy

## 2021-12-19 ENCOUNTER — Other Ambulatory Visit: Payer: Self-pay

## 2021-12-19 DIAGNOSIS — M6281 Muscle weakness (generalized): Secondary | ICD-10-CM | POA: Insufficient documentation

## 2021-12-19 DIAGNOSIS — M542 Cervicalgia: Secondary | ICD-10-CM | POA: Insufficient documentation

## 2021-12-19 DIAGNOSIS — M25552 Pain in left hip: Secondary | ICD-10-CM | POA: Insufficient documentation

## 2021-12-19 NOTE — Patient Instructions (Addendum)
Access Code: [place] ?URL: [email or web address] ?Date: 12/19/2021 ?Prepared by: Hilda Blades ? ?Exercises ?- Supine Cervical Retraction with Towel  - 2 x daily - 10 reps - 5 seconds hold ?- Supine Shoulder Horizontal Abduction with Resistance  - 1 x daily - 2 sets - 10 reps ?- Sidelying Hip Abduction  - 1 x daily - 2 sets - 10 reps ?- Sidelying Thoracic Lumbar Rotation  - 1 x daily - 2 sets - 10 reps ?- Seated Assisted Cervical Rotation with Towel  - 2 x daily - 10 reps ?- Gentle Levator Scapulae Stretch  - 1 x daily - 2 reps - 30 seconds hold ?- Standing Bilateral Low Shoulder Row with Anchored Resistance  - 1 x daily - 2 sets - 10 reps ?- Shoulder External Rotation and Scapular Retraction with Resistance  - 1 x daily - 2 sets - 10 reps ?

## 2021-12-25 NOTE — Therapy (Incomplete)
?OUTPATIENT PHYSICAL THERAPY TREATMENT NOTE ? ? ?Patient Name: Sandra Briggs ?MRN: [id number] ?DOB:Dec 13, 1975, 45 y.o., female ?Today's Date: 12/25/2021 ? ?PCP: Leeanne Rio, MD ?REFERRING PROVIDER: Leeanne Rio, MD ? ? ? ? ? ? ?Past Medical History:  ?Diagnosis Date  ? Anxiety   ? Chronic pain   ? flank  ? Complication of anesthesia   ? Dysuria-frequency syndrome   ? Exercise-induced asthma   ? Panic disorder   ? Sprain of MCL (medial collateral ligament) of knee   ? ?Past Surgical History:  ?Procedure Laterality Date  ? APPENDECTOMY    ? CESAREAN SECTION    ? CRYOABLATION N/A   ? ENDOMETRIAL ABLATION  2009  ? novasure  ? TUBAL LIGATION  06/28/2005  ? [place] EXTRACTION  2000  ? ?Patient Active Problem List  ? Diagnosis Date Noted  ? Dyspareunia in female 08/06/2021  ? Healthcare maintenance 11/28/2020  ? Rash and nonspecific skin eruption 11/19/2017  ? Postcoital bleeding 08/18/2017  ? Hand pain 08/18/2017  ? Conjunctivitis 04/11/2016  ? Encounter for chronic pain management 09/04/2014  ? Shortness of breath 03/04/2014  ? Pseudogout 12/30/2013  ? Overweight (BMI 25.0-29.9) 02/27/2012  ? Cervicalgia 09/07/2010  ? Bipolar disorder ([place]) 07/02/2007  ? PANIC DISORDER 06/08/2007  ? Allergic rhinitis 10/16/2006  ? ? ?REFERRING DIAG: Chronic neck pain ? ?THERAPY DIAG:  ?No diagnosis found. ? ?PERTINENT HISTORY: None ? ?PRECAUTIONS: None ? ?SUBJECTIVE: Patient reports she is doing well with no new issues. She does feel she can turn her head better which has helped her driving.  ? ?PAIN:  ?Are you having pain? Yes:  ?NPRS scale: 0/10 when sitting still, 5-6/10 when bending neck forward ?Pain location: Neck ?Pain description: Constant, sharp, achy, throbbing ?Aggravating factors: Turning neck, looking up and down ?Relieving factors: Ice and heat, rest, medication ?  ?Are you having pain? Yes:  ?NPRS scale: 0/10 (3-4/5 when it occurs) ?Pain location: Left hip, lateral ?Pain description: Intermittent,  sharp ?Aggravating factors: Activity or twisting that can cause it to hurt ?Relieving factors: Sit/rest, medication, hot bath ? ?PATIENT GOALS: Pain relief and prevention ? ? ?OBJECTIVE:  ?PATIENT SURVEYS:  ?FOTO 56% ?  ?POSTURE:  ?Rounded shoulders and forward head posture ?  ? PALPATION: ?Tender to palpation right > left upper trap            ?  ?CERVICAL ROM:  ?  ?Active ROM A/PROM (deg) ?11/22/2021  ? ?12/12/2021  ? ?12/19/2021  ?Flexion 50    ?Extension 50    ?Right lateral flexion 30    ?Left lateral flexion 25*    ?Right rotation 65 70 70  ?Left rotation 70 70 70  ?*no tightness reported ?  ?UE ROM: ?                      UE AROM grossly WFL and non-painful ?  ?UE MMT: ?  ?MMT Right ?11/22/2021 Left ?11/22/2021  ?Shoulder flexion 5 5  ?Shoulder abduction 5 5  ?Shoulder extension 5 5  ?Shoulder external rotation 5 5  ?Periscapular muscles Grossly 4/5  ?Grip strength 61, 56, 60 35, 30, 42  ?  ?LE MMT: ?  ?MMT Right ?11/22/2021 Left ?11/22/2021  ?Hip flexion 4+ 4+  ?Hip extension 4 4-  ?Hip abduction 4 4-  ?Knee flexion 5 4+  ?Knee extension 5 4+  ?  ?FUNCTIONAL TESTS:  ?DNF Endurance: 13 seconds ?  ? ?TODAY'S TREATMENT:  ?[facility]  Treatment:                                                DATE: 12/26/2021 ?Therapeutic Exercise: ?UBE L1 x 4 min (2 fwd/bwd) while taking subjective ?Supine horizontal abduction with green 2 x 10 ?Supine cervical retraction 10 x 5 sec ?Sidelying thoracic rotation x 10 each ?Double ER and scap retraction with green 2 x 10 ?Seated SNAG cervical rotation x 10 each ?Machine low row 20# 2 x 10 ?Machine lat pull down with 15# 2 x 10 ?Sidelying thoracic rotation x 10 each ?Sidelying hip abduction 2 x 10 each ?Bridge 2 x 10 ? ? ?[facility] Adult PT Treatment:                                                DATE: 12/19/2021 ?Therapeutic Exercise: ?UBE L1 x 4 min (2 fwd/bwd) while taking subjective ?Supine horizontal abduction with green 2 x 10 ?Supine cervical retraction 10 x 5 sec ?Sidelying thoracic rotation  x 10 each ?Double ER and scap retraction with green 2 x 10 ?Seated SNAG cervical rotation x 10 each ?Machine low row 20# 2 x 10 ?Machine lat pull down with 15# 2 x 10 ?Sidelying thoracic rotation x 10 each ?Sidelying hip abduction 2 x 10 each ?Bridge 2 x 10 ? ?[facility] Adult PT Treatment:                                                DATE: 12/12/2021 ?Therapeutic Exercise: ?NuStep L5 x 5 min with UE/LE while taking subjective ?Supine cervical retraction 10 x 5 sec ?Sidelying thoracic rotation x 10 each ?Double ER and scap retraction with red x 10 ?Row with red x 10 ?Manual Therapy: ?Skilled palpation and monitoring of muscle tension while performing TPDN treatment ?Suboccipital release with gentle manual traction ?Passive upper trap and levator scap stretch ? Trigger Point Dry Needling Treatment: ?Pre-treatment instruction: Patient instructed on dry needling rationale, procedures, and possible side effects including pain during treatment (achy,cramping feeling), bruising, drop of blood, lightheadedness, nausea, sweating. ?Patient Consent Given: Yes ?Education handout provided: Previously provided ?Muscles treated: Bilateral upper trap, left levator scap, left suboccipitals  ?Needle size and number: .30x30m x 6 ?Electrical stimulation performed: No ?Parameters: N/A ?Treatment response/outcome: Twitch response elicited, Palpable decrease in muscle tension, and report of reduced muscle tension ?Post-treatment instructions: Patient instructed to expect possible mild to moderate muscle soreness later today and/or tomorrow. Patient instructed in methods to reduce muscle soreness and to continue prescribed HEP. If patient was dry needled over the lung field, patient was instructed on signs and symptoms of pneumothorax and, however unlikely, to see immediate medical attention should they occur. Patient was also educated on signs and symptoms of infection and to seek medical attention should they occur. Patient verbalized  understanding of these instructions and education. ?  ?PATIENT EDUCATION:  ?Education details: HEP update ?Person educated: Patient ?Education method: Explanation, Demonstration, Tactile cues, Verbal cues, Handout ?Education comprehension: verbalized understanding, returned demonstration, verbal cues required, tactile cues required, and needs further education ?  ?HOME EXERCISE PROGRAM: ?Access  Code: [place] ?  ?  ?ASSESSMENT: ?CLINICAL IMPRESSION: ?Patient tolerated therapy well with no adverse effects. *** Patient would benefit from continued skilled PT to progress mobility and strength in order to reduce pain and maximize functional ability. ? ?Therapy focused on progressing her mobility and postural strength with good tolerance. She reports improvement in her cervical symptoms with improved cervical rotation and less tightness. She does report continued sharp pain with cervical flexion. Incorporated hip and core strengthening this visit with good tolerance. Updated HEP this visit with good tolerance.  ?  ?OBJECTIVE IMPAIRMENTS decreased activity tolerance, decreased ROM, decreased strength, postural dysfunction, and pain.  ?  ?ACTIVITY LIMITATIONS cleaning, driving, meal prep, occupation, and shopping.  ?  ?PERSONAL FACTORS Fitness, Past/current experiences, and Time since onset of injury/illness/exacerbation are also affecting patient's functional outcome.  ?  ?  ?GOALS: ?Goals reviewed with patient? Yes ?  ?SHORT TERM GOALS: Target date: 12/20/2021 ?  ?Patient will be I with initial HEP in order to progress with therapy. ?Baseline: HEP issued at eval ?Goal status: INITIAL ?  ?2.  PT will review FOTO with patient by 3rd visit in order to understand expected progress and outcome with therapy. ?Baseline: FOTO assessed at eval ?12/12/2021: reviewed ?Goal status: MET ?  ?3.  Patient will improve left cervical rotation to 70 deg without sharp pain at end range to improve driving ?Baseline: patient reports sharp pain  at end range with limitation ?Goal status: INITIAL ?  ?LONG TERM GOALS: Target date: 01/17/2022 ?  ?Patient will be I with final HEP to maintain progress from PT. ?Baseline: HEP issued at eval ?Goal status: INITIA

## 2021-12-26 ENCOUNTER — Ambulatory Visit: Payer: BC Managed Care – PPO | Admitting: Physical Therapy

## 2022-01-22 ENCOUNTER — Encounter: Payer: Self-pay | Admitting: *Deleted

## 2022-01-23 ENCOUNTER — Ambulatory Visit: Payer: BC Managed Care – PPO | Attending: Family Medicine | Admitting: Physical Therapy

## 2022-01-23 NOTE — Therapy (Incomplete)
OUTPATIENT PHYSICAL THERAPY TREATMENT NOTE   Patient Name: Sandra Briggs MRN: [id number] DOB:Apr 03, 1976, 46 y.o., female Today's Date: 01/23/2022  PCP: Leeanne Rio, MD REFERRING PROVIDER: Leeanne Rio, MD       Past Medical History:  Diagnosis Date   Anxiety    Chronic pain    flank   Complication of anesthesia    Dysuria-frequency syndrome    Exercise-induced asthma    Panic disorder    Sprain of MCL (medial collateral ligament) of knee    Past Surgical History:  Procedure Laterality Date   APPENDECTOMY     CESAREAN SECTION     CRYOABLATION N/A    ENDOMETRIAL ABLATION  2009   novasure   TUBAL LIGATION  06/28/2005   WISDOM TOOTH EXTRACTION  2000   Patient Active Problem List   Diagnosis Date Noted   Dyspareunia in female 08/06/2021   Healthcare maintenance 11/28/2020   Rash and nonspecific skin eruption 11/19/2017   Postcoital bleeding 08/18/2017   Hand pain 08/18/2017   Conjunctivitis 04/11/2016   Encounter for chronic pain management 09/04/2014   Shortness of breath 03/04/2014   Pseudogout 12/30/2013   Overweight (BMI 25.0-29.9) 02/27/2012   Cervicalgia 09/07/2010   Bipolar disorder ([place]) 07/02/2007   PANIC DISORDER 06/08/2007   Allergic rhinitis 10/16/2006    REFERRING DIAG: Chronic neck pain  THERAPY DIAG:  No diagnosis found.  PERTINENT HISTORY: None  PRECAUTIONS: None  SUBJECTIVE: Patient reports she is doing well with no new issues. She does feel she can turn her head better which has helped her driving.   PAIN:  Are you having pain? Yes:  NPRS scale: 0/10 when sitting still, 5-6/10 when bending neck forward Pain location: Neck Pain description: Constant, sharp, achy, throbbing Aggravating factors: Turning neck, looking up and down Relieving factors: Ice and heat, rest, medication   Are you having pain? Yes:  NPRS scale: 0/10 (3-4/5 when it occurs) Pain location: Left hip, lateral Pain description: Intermittent,  sharp Aggravating factors: Activity or twisting that can cause it to hurt Relieving factors: Sit/rest, medication, hot bath  PATIENT GOALS: Pain relief and prevention   OBJECTIVE:  PATIENT SURVEYS:  FOTO 56%   POSTURE:  Rounded shoulders and forward head posture    PALPATION: Tender to palpation right > left upper trap              CERVICAL ROM:    Active ROM A/PROM (deg) 11/22/2021   12/12/2021   12/19/2021  Flexion 50    Extension 50    Right lateral flexion 30    Left lateral flexion 25*    Right rotation 65 70 70  Left rotation 70 70 70  *no tightness reported   UE ROM:                       UE AROM grossly WFL and non-painful   UE MMT:   MMT Right 11/22/2021 Left 11/22/2021  Shoulder flexion 5 5  Shoulder abduction 5 5  Shoulder extension 5 5  Shoulder external rotation 5 5  Periscapular muscles Grossly 4/5  Grip strength 61, 56, 60 35, 30, 42    LE MMT:   MMT Right 11/22/2021 Left 11/22/2021  Hip flexion 4+ 4+  Hip extension 4 4-  Hip abduction 4 4-  Knee flexion 5 4+  Knee extension 5 4+    FUNCTIONAL TESTS:  DNF Endurance: 13 seconds    TODAY'S TREATMENT:  [facility] Adult PT  Treatment:                                                DATE: 01/23/2022 Therapeutic Exercise: UBE L1 x 4 min (2 fwd/bwd) while taking subjective Supine horizontal abduction with green 2 x 10 Supine cervical retraction 10 x 5 sec Sidelying thoracic rotation x 10 each Double ER and scap retraction with green 2 x 10 Seated SNAG cervical rotation x 10 each Machine low row 20# 2 x 10 Machine lat pull down with 15# 2 x 10 Sidelying thoracic rotation x 10 each Sidelying hip abduction 2 x 10 each Bridge 2 x 10   [facility] Adult PT Treatment:                                                DATE: 12/19/2021 Therapeutic Exercise: UBE L1 x 4 min (2 fwd/bwd) while taking subjective Supine horizontal abduction with green 2 x 10 Supine cervical retraction 10 x 5 sec Sidelying thoracic rotation  x 10 each Double ER and scap retraction with green 2 x 10 Seated SNAG cervical rotation x 10 each Machine low row 20# 2 x 10 Machine lat pull down with 15# 2 x 10 Sidelying thoracic rotation x 10 each Sidelying hip abduction 2 x 10 each Bridge 2 x 10  [facility] Adult PT Treatment:                                                DATE: 12/12/2021 Therapeutic Exercise: NuStep L5 x 5 min with UE/LE while taking subjective Supine cervical retraction 10 x 5 sec Sidelying thoracic rotation x 10 each Double ER and scap retraction with red x 10 Row with red x 10 Manual Therapy: Skilled palpation and monitoring of muscle tension while performing TPDN treatment Suboccipital release with gentle manual traction Passive upper trap and levator scap stretch  Trigger Point Dry Needling Treatment: Pre-treatment instruction: Patient instructed on dry needling rationale, procedures, and possible side effects including pain during treatment (achy,cramping feeling), bruising, drop of blood, lightheadedness, nausea, sweating. Patient Consent Given: Yes Education handout provided: Previously provided Muscles treated: Bilateral upper trap, left levator scap, left suboccipitals  Needle size and number: .30x33mm x 6 Electrical stimulation performed: No Parameters: N/A Treatment response/outcome: Twitch response elicited, Palpable decrease in muscle tension, and report of reduced muscle tension Post-treatment instructions: Patient instructed to expect possible mild to moderate muscle soreness later today and/or tomorrow. Patient instructed in methods to reduce muscle soreness and to continue prescribed HEP. If patient was dry needled over the lung field, patient was instructed on signs and symptoms of pneumothorax and, however unlikely, to see immediate medical attention should they occur. Patient was also educated on signs and symptoms of infection and to seek medical attention should they occur. Patient verbalized  understanding of these instructions and education.   PATIENT EDUCATION:  Education details: HEP update Person educated: Patient Education method: Explanation, Demonstration, Tactile cues, Verbal cues, Handout Education comprehension: verbalized understanding, returned demonstration, verbal cues required, tactile cues required, and needs further education   HOME EXERCISE PROGRAM: Access  Code: [place]     ASSESSMENT: CLINICAL IMPRESSION: Patient tolerated therapy well with no adverse effects. *** Patient would benefit from continued skilled PT to progress mobility and strength in order to reduce pain and maximize functional ability.  Therapy focused on progressing her mobility and postural strength with good tolerance. She reports improvement in her cervical symptoms with improved cervical rotation and less tightness. She does report continued sharp pain with cervical flexion. Incorporated hip and core strengthening this visit with good tolerance. Updated HEP this visit with good tolerance.    OBJECTIVE IMPAIRMENTS decreased activity tolerance, decreased ROM, decreased strength, postural dysfunction, and pain.    ACTIVITY LIMITATIONS cleaning, driving, meal prep, occupation, and shopping.    PERSONAL FACTORS Fitness, Past/current experiences, and Time since onset of injury/illness/exacerbation are also affecting patient's functional outcome.      GOALS: Goals reviewed with patient? Yes   SHORT TERM GOALS: Target date: 12/20/2021   Patient will be I with initial HEP in order to progress with therapy. Baseline: HEP issued at eval Goal status: INITIAL   2.  PT will review FOTO with patient by 3rd visit in order to understand expected progress and outcome with therapy. Baseline: FOTO assessed at eval 12/12/2021: reviewed Goal status: MET   3.  Patient will improve left cervical rotation to 70 deg without sharp pain at end range to improve driving Baseline: patient reports sharp pain  at end range with limitation Goal status: INITIAL   LONG TERM GOALS: Target date: 01/17/2022   Patient will be I with final HEP to maintain progress from PT. Baseline: HEP issued at eval Goal status: INITIAL   2.  Patient will report >/= 65% status on FOTO to indicate improved functional ability. Baseline: 56% Goal status: INITIAL   3.  Patient will demonstrate left hip weakness >/= 4/5 MMT to improve activity tolerance and reduce pain Baseline: 4-/5 MMT Goal status: INITIAL   4.  Patient will demonstrate DNF endurance >/= 25 seconds to improve cervical control and reduce pain Baseline: DNF endurance 13 seconds Goal status: INITIAL     PLAN: PT FREQUENCY: 1x/week   PT DURATION: 8 weeks   PLANNED INTERVENTIONS: Therapeutic exercises, Therapeutic activity, Neuromuscular re-education, Balance training, Gait training, Patient/Family education, Joint manipulation, Joint mobilization, Aquatic Therapy, Dry Needling, Electrical stimulation, Spinal manipulation, Spinal mobilization, Cryotherapy, Moist heat, Taping, and Manual therapy   PLAN FOR NEXT SESSION: Review HEP and progress PRN, manual/dry needling for upper traps and possible glute med, continue cervical stretching and mobs, postural control and strengthening, hip strengthening, response to TPDN.    Hilda Blades, PT, DPT, LAT, ATC 01/23/22  8:57 AM Phone: [phone] Fax: [phone]

## 2022-02-04 ENCOUNTER — Ambulatory Visit: Payer: BC Managed Care – PPO | Admitting: Family Medicine

## 2022-02-05 NOTE — Progress Notes (Deleted)
  Date of Visit: 02/06/2022   SUBJECTIVE:   HPI:  Sandra Briggs presents today for ***  UDS Lv referred to pT   OBJECTIVE:   There were no vitals taken for this visit. Gen: *** HEENT: *** Heart: *** Lungs: *** Neuro: *** Ext: ***  ASSESSMENT/PLAN:   Health maintenance:  -***  No problem-specific Assessment & Plan notes found for this encounter.  FOLLOW UP: Follow up in *** for ***  Sandra Briggs, Sandra Briggs

## 2022-02-06 ENCOUNTER — Ambulatory Visit: Payer: BC Managed Care – PPO | Admitting: Family Medicine

## 2022-02-25 ENCOUNTER — Encounter: Payer: Self-pay | Admitting: Family Medicine

## 2022-02-25 NOTE — Telephone Encounter (Signed)
Called patient and scheduled follow up appointment on 8/10.  Patient is requesting refill of hydrocodone-acetaminophen to be sent to the [facility] on [address]. This is not on current medication list.   Please advise.   Talbot Grumbling, RN

## 2022-02-26 MED ORDER — HYDROCODONE-ACETAMINOPHEN 10-325 MG PO TABS: 1.0000 | ORAL_TABLET | Freq: Two times a day (BID) | ORAL | 0 refills | Status: DC

## 2022-03-28 ENCOUNTER — Other Ambulatory Visit: Payer: Self-pay

## 2022-03-28 ENCOUNTER — Encounter: Payer: Self-pay | Admitting: Family Medicine

## 2022-03-28 ENCOUNTER — Ambulatory Visit: Payer: BC Managed Care – PPO | Admitting: Family Medicine

## 2022-03-28 MED ORDER — HYDROCODONE-ACETAMINOPHEN 10-325 MG PO TABS: 1.0000 | ORAL_TABLET | Freq: Two times a day (BID) | ORAL | 0 refills | Status: DC

## 2022-03-28 NOTE — Telephone Encounter (Signed)
PDMP reviewed with appropriate findings. Will send in 1 month supply and see patient in September.  Leeanne Rio, MD

## 2022-03-28 NOTE — Telephone Encounter (Signed)
Patient calls nurse line requesting refill on pain medication. Patient was supposed to have follow up appointment today, however, she overslept.   Patient rescheduled with Dr. [name] on 04/29/22.  Medication pended to [facility] on [address]. Please advise.   Talbot Grumbling, RN

## 2022-04-26 MED ORDER — HYDROCODONE-ACETAMINOPHEN 10-325 MG PO TABS: 1.0000 | ORAL_TABLET | Freq: Two times a day (BID) | ORAL | 0 refills | Status: DC

## 2022-04-29 ENCOUNTER — Ambulatory Visit: Payer: BC Managed Care – PPO | Admitting: Family Medicine

## 2022-05-03 ENCOUNTER — Ambulatory Visit: Payer: BC Managed Care – PPO | Admitting: Family Medicine

## 2022-05-03 ENCOUNTER — Encounter: Payer: Self-pay | Admitting: Family Medicine

## 2022-05-03 VITALS — BP 128/80 | HR 101 | Ht 64.0 in | Wt 178.4 lb

## 2022-05-03 DIAGNOSIS — J3089 Other allergic rhinitis: Secondary | ICD-10-CM | POA: Diagnosis not present

## 2022-05-03 DIAGNOSIS — Z79899 Other long term (current) drug therapy: Secondary | ICD-10-CM

## 2022-05-03 DIAGNOSIS — G8929 Other chronic pain: Secondary | ICD-10-CM | POA: Diagnosis not present

## 2022-05-03 DIAGNOSIS — Z1211 Encounter for screening for malignant neoplasm of colon: Secondary | ICD-10-CM | POA: Diagnosis not present

## 2022-05-03 LAB — POCT URINE DRUG SCREEN
POC Amphetamine UR: NOT DETECTED
POC BENZODIAZEPINES UR: NOT DETECTED
POC Barbiturate UR: NOT DETECTED
POC Buprenorphine (BUP): NOT DETECTED
POC Cocaine UR: NOT DETECTED
POC Ecstasy UR: NOT DETECTED
POC Marijuana UR: NOT DETECTED
POC Methadone UR: NOT DETECTED
POC Methamphetamine UR: NOT DETECTED
POC Opiate Ur: NOT DETECTED
POC Oxycodone UR: NOT DETECTED
POC PH URINE: NORMAL
POC PHENCYCLIDINE UR: NOT DETECTED
POC SPECIFIC GRAVITY URINE: NORMAL
URINE TEMPERATURE: 94 Degrees F (ref 90.0–100.0)

## 2022-05-03 MED ORDER — MONTELUKAST SODIUM 10 MG PO TABS: ORAL_TABLET | ORAL | 1 refills | Status: DC

## 2022-05-03 MED ORDER — HYDROCODONE-ACETAMINOPHEN 10-325 MG PO TABS: 1.0000 | ORAL_TABLET | Freq: Two times a day (BID) | ORAL | 0 refills | Status: DC

## 2022-05-03 MED ORDER — IPRATROPIUM BROMIDE 0.06 % NA SOLN: NASAL | 3 refills | Status: DC

## 2022-05-03 NOTE — Patient Instructions (Signed)
It was great to see you again today!  Sent in medication refills Someone will call you about an appointment for colonoscopy Follow-up with me in 3 months  Be well, Dr. [name]

## 2022-05-03 NOTE — Assessment & Plan Note (Signed)
Overall stable.  Benefits of ongoing therapy outweigh risks.  Check urine drug screen today.  Refill for 3 months.  Follow-up in 3 months.

## 2022-05-03 NOTE — Assessment & Plan Note (Signed)
Overall doing well.  Encouraged follow-up with allergist.  Refilled Atrovent nasal spray and Singulair.

## 2022-05-03 NOTE — Progress Notes (Signed)
  Date of Visit: 05/03/2022   SUBJECTIVE:   HPI:  Sandra Briggs presents today for routine follow-up.  Chronic pain: Taking Norco 10-325 mg 1 pill twice daily.  Also taking gabapentin 300 mg 3 times a day.  She also takes ibuprofen 800 mg , does this about 2-3 times a week, and that is the max she will take in 1 day for ibuprofen.  Tolerating this regimen well.  It helps her pain and contributes to her being functional.  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  She is careful about storing her medication safely, does not take it out of the home.  Stores in a locked box away from her teenagers.  Denies any side effects from the medication.  No difficulty controlling how often she is taking it.  Denies any other substance use.  Last dose was this morning, also took it yesterday.  She did just have a 7-day fill due to an issue with her insurance.  Allergies: Is due for follow-up with allergist.  She does need a refill of her Singulair and Atrovent nasal spray.  These are working well for her.  OBJECTIVE:   BP 128/80   Pulse (!) 101   Ht '5\' 4"'$  (1.626 m)   Wt 178 lb 6.4 oz (80.9 kg)   SpO2 98%   BMI 30.62 kg/m  Gen: No acute distress, pleasant, cooperative HEENT: Normocephalic, atraumatic, nares patent, full range of motion of neck. Lungs: Normal respiratory effort Neuro: Speech normal, grossly nonfocal Ext: Grip 5 out of 5 on the right, 4 out of 5 on the left, consistent with prior exams.  Shoulder abduction and abduction 5 out of 5 bilaterally.  Shoulder shrug 5 out of 5 bilaterally.  ASSESSMENT/PLAN:   Health maintenance:  -Reviewed options for colon cancer screening, patient prefers colonoscopy, referral placed -Return for flu shot when available  Encounter for chronic pain management Overall stable.  Benefits of ongoing therapy outweigh risks.  Check urine drug screen today.  Refill for 3 months.  Follow-up in 3 months.  Allergic rhinitis Overall doing well.  Encouraged follow-up with  allergist.  Refilled Atrovent nasal spray and Singulair.  FOLLOW UP: Follow up in 3 months for chronic pain  [name], [place]

## 2022-05-07 ENCOUNTER — Telehealth: Payer: Self-pay

## 2022-05-07 LAB — DRUG SCR UR, PAIN MGMT, REFLEX CONF
Amphetamine Scrn, Ur: NEGATIVE ng/mL
BARBITURATE SCREEN URINE: NEGATIVE ng/mL
BENZODIAZEPINE SCREEN, URINE: NEGATIVE ng/mL
Cocaine (Metab) Scrn, Ur: NEGATIVE ng/mL
Creatinine(Crt), U: 118.1 mg/dL (ref 20.0–300.0)
Methadone Screen, Urine: NEGATIVE ng/mL
OXYCODONE+OXYMORPHONE UR QL SCN: NEGATIVE ng/mL
Opiate Scrn, Ur: POSITIVE ng/mL — AB
Ph of Urine: 5.6 (ref 4.5–8.9)
Phencyclidine Qn, Ur: NEGATIVE ng/mL
Propoxyphene Scrn, Ur: NEGATIVE ng/mL

## 2022-05-07 MED ORDER — HYDROCODONE-ACETAMINOPHEN 10-325 MG PO TABS: 1.0000 | ORAL_TABLET | Freq: Two times a day (BID) | ORAL | 0 refills | Status: DC

## 2022-05-07 NOTE — Telephone Encounter (Signed)
Done

## 2022-05-07 NOTE — Telephone Encounter (Signed)
Patient calls nurse line in regards to Hydrocodone prescription.   [facility] on [address] has no stock of medication.   Patient reports Sandra Briggs at [facility] does have stock.   Please resend to [facility].   I have cancelled prescription at [facility].

## 2022-05-23 ENCOUNTER — Ambulatory Visit: Payer: BC Managed Care – PPO | Admitting: Family Medicine

## 2022-05-23 ENCOUNTER — Encounter: Payer: Self-pay | Admitting: Family Medicine

## 2022-05-23 VITALS — BP 138/93 | HR 86 | Ht 64.0 in | Wt 180.2 lb

## 2022-05-23 DIAGNOSIS — Z23 Encounter for immunization: Secondary | ICD-10-CM | POA: Diagnosis not present

## 2022-05-23 DIAGNOSIS — R21 Rash and other nonspecific skin eruption: Secondary | ICD-10-CM | POA: Diagnosis not present

## 2022-05-23 MED ORDER — CETIRIZINE HCL 10 MG PO TABS: 10.0000 mg | ORAL_TABLET | Freq: Two times a day (BID) | ORAL | 0 refills | Status: AC

## 2022-05-23 MED ORDER — FAMOTIDINE 20 MG PO TABS: 20.0000 mg | ORAL_TABLET | Freq: Two times a day (BID) | ORAL | 0 refills | Status: DC

## 2022-05-23 MED ORDER — PREDNISONE 20 MG PO TABS: 20.0000 mg | ORAL_TABLET | Freq: Every day | ORAL | 0 refills | Status: DC

## 2022-05-23 NOTE — Progress Notes (Signed)
vs   SUBJECTIVE:   CHIEF COMPLAINT / HPI:   Rash -Noticed around 10 PM last night. Has not had a rash like this before -First noticed on upper chest, then started to see lesions appear on upper back, neck, face, scalp. -No bleeding or blistering -The rash is itchy and somewhat painful to touch, especially in the neck -No new detergents, soaps, lotions, clothes, bras.  No recent exposure to other/new allergens.  No bug or tick bites.  No new medications -Denies recent illness, CP/SOB, nausea/vomiting.  Denies pain or rash elsewhere on her body -Denies new sexual partners  PERTINENT  PMH / PSH: Chronic pain, allergic rhinitis  OBJECTIVE:   BP (!) 138/93   Pulse 86   Ht '5\' 4"'$  (1.626 m)   Wt 180 lb 3.2 oz (81.7 kg)   SpO2 98%   BMI 30.93 kg/m    Gen: Well-appearing, NAD, conversational CV: RRR no MRG Pulm: CTAB normal WOB on RA Skin: Erythematous morbilliform rash, minimally raised, noted across chest, upper back, neck, cheeks, and scalp.  Stings to touch especially over neck.  ASSESSMENT/PLAN:   Rash and nonspecific skin eruption Patient presents with erythematous morbilliform rash, pruritic and somewhat painful to touch.  Widespread distribution across trunk, upper back, neck, face, and scalp. Onset <1 day. No relief with Benadryl or analgesic cream.  Given her history of atopy, suspect atopic dermatitis.  Unclear whether this is an allergic reaction despite its sudden onset, given no new allergens.  May also have a component of photosensitivity given the distribution in sun exposed areas.  Despite pain to touch, lack of dermatomal distribution and blistering makes shingles less likely.  No high risk sexual activity. -Prednisone 20 mg daily x5 days -Famotidine and Zyrtec twice daily  -Return precautions given, follow-up as needed   Flu vaccine administered today  Sandra Briggs, [place]

## 2022-05-23 NOTE — Assessment & Plan Note (Addendum)
Patient presents with erythematous morbilliform rash, pruritic and somewhat painful to touch.  Widespread distribution across trunk, upper back, neck, face, and scalp. Onset <1 day. No relief with Benadryl or analgesic cream.  Given her history of atopy, suspect atopic dermatitis.  Unclear whether this is an allergic reaction despite its sudden onset, given no new allergens.  May also have a component of photosensitivity given the distribution in sun exposed areas.  Despite pain to touch, lack of dermatomal distribution and blistering makes shingles less likely.  No high risk sexual activity. -Prednisone 20 mg daily x5 days -Famotidine and Zyrtec twice daily  -Return precautions given, follow-up as needed

## 2022-05-23 NOTE — Patient Instructions (Addendum)
It was great to see you today! Thank you for choosing [facility] for your primary care. You were seen in clinic today for a rash.  Updates from today's visit: Please use prednisone daily for 5 days. Please use the Zyrtec and Pepcid twice a day. You can continue to use any creams to help with the itching. Please don't use Benadryl with the other antihistamines.  If you haven't already, sign up for My Chart to have easy access to your labs results, and communication with your primary care physician.  Call the clinic at [phone] if your symptoms worsen or you have any concerns.  You should return to our clinic Return if symptoms worsen or fail to improve. Please arrive 15 minutes before your appointment to ensure smooth check in process.  We appreciate your efforts in making this happen.  Thank you for allowing me to participate in your care, Sandra Albino, MD 05/23/2022, 2:34 PM PGY-1, [place]

## 2022-05-31 ENCOUNTER — Encounter: Payer: Self-pay | Admitting: Gastroenterology

## 2022-06-06 ENCOUNTER — Telehealth: Payer: Self-pay

## 2022-06-06 NOTE — Telephone Encounter (Signed)
Patient calls nurse line regarding stock issues with hydrocodone-acetaminophen 10 mg-'325mg'$ .   She has called multiple pharmacies, however, has not been able to find pharmacy with stock. Medication is on nationwide backorder.   Pharmacist is requesting that prescription be sent for hydrocodone-acetaminophen 5 mg-325 mg as they have plenty of this dosage in stock.   Canceled current prescription for 10-325 mg.   Forwarding request to PCP. If appropriate, please send to [facility] on [address].   Talbot Grumbling, RN

## 2022-06-07 ENCOUNTER — Encounter: Payer: Self-pay | Admitting: Family Medicine

## 2022-06-07 MED ORDER — HYDROCODONE-ACETAMINOPHEN 5-325 MG PO TABS: 2.0000 | ORAL_TABLET | Freq: Two times a day (BID) | ORAL | 0 refills | Status: DC | PRN

## 2022-06-07 NOTE — Telephone Encounter (Signed)
Patient returns call to nurse line regarding this concern.   Please advise.   Felise Georgia C Jupiter Kabir, RN  

## 2022-06-07 NOTE — Telephone Encounter (Signed)
Attempted to reach pharmacy to verify this history, but could not get through despite waiting 10 minutes.  PDMP reviewed and is appropriate.   Sent in 30 day supply of 5-'325mg'$  pills, take 2 tabs twice daily as needed for pain.  Will message patient letting her know this has been done  Leeanne Rio, MD

## 2022-06-13 ENCOUNTER — Telehealth: Payer: Self-pay

## 2022-06-13 ENCOUNTER — Ambulatory Visit (AMBULATORY_SURGERY_CENTER): Payer: Self-pay

## 2022-06-13 VITALS — Ht 64.0 in | Wt 176.0 lb

## 2022-06-13 DIAGNOSIS — Z1211 Encounter for screening for malignant neoplasm of colon: Secondary | ICD-10-CM

## 2022-06-13 MED ORDER — NA SULFATE-K SULFATE-MG SULF 17.5-3.13-1.6 GM/177ML PO SOLN: 1.0000 | ORAL | 0 refills | Status: DC

## 2022-06-13 NOTE — Telephone Encounter (Signed)
John, During Tracy today for upcoming [facility]-Colon 06/27/22, pt recalled that her grandfather had issues with succinylcholine.  She called a family member and was told he experienced Malignant Hyperthermia.   Pt has not had any issues with sedation in the past.  (Only significant hx is exercise induced asthma) Thanks, Aaron Edelman (PV)

## 2022-06-13 NOTE — Progress Notes (Signed)
No egg or soy allergy known to patient  No issues known to pt with past sedation with any surgeries or procedures Patient denies ever being told they had issues or difficulty with intubation  Notes family history of Malignant Hyperthermia Notes grandfather did have issue with succinylcholine Pt is not on diet pills Pt is not on  home 02  Pt is not on blood thinners  Pt reports occasional issues with constipation  No A fib or A flutter Have any cardiac testing pending--denied Pt instructed to use Singlecare.com or GoodRx for a price reduction on prep   Patient's chart reviewed by Osvaldo Angst CNRA prior to previsit and patient appropriate for the [facility].  Previsit completed and red dot placed by patient's name on their procedure day (on provider's schedule).

## 2022-06-19 ENCOUNTER — Encounter: Payer: Self-pay | Admitting: Gastroenterology

## 2022-06-27 ENCOUNTER — Ambulatory Visit (AMBULATORY_SURGERY_CENTER): Payer: BC Managed Care – PPO | Admitting: Gastroenterology

## 2022-06-27 ENCOUNTER — Encounter: Payer: Self-pay | Admitting: Gastroenterology

## 2022-06-27 VITALS — BP 127/79 | HR 85 | Temp 98.4°F | Resp 11 | Ht 64.0 in | Wt 176.0 lb

## 2022-06-27 DIAGNOSIS — D125 Benign neoplasm of sigmoid colon: Secondary | ICD-10-CM

## 2022-06-27 DIAGNOSIS — K635 Polyp of colon: Secondary | ICD-10-CM | POA: Diagnosis not present

## 2022-06-27 DIAGNOSIS — Z1211 Encounter for screening for malignant neoplasm of colon: Secondary | ICD-10-CM | POA: Diagnosis not present

## 2022-06-27 MED ORDER — SODIUM CHLORIDE 0.9 % IV SOLN: 500.0000 mL | Freq: Once | INTRAVENOUS | Status: DC

## 2022-06-27 NOTE — Op Note (Signed)
[place] Patient Name: Sandra Briggs Procedure Date: 06/27/2022 11:23 AM MRN: [id number] Endoscopist: Nicki Reaper E. Candis Schatz , MD, [id number] Age: 46 Referring MD:  Date of Birth: 1975/12/21 Gender: Female Account #: [id number] Procedure:                Colonoscopy Indications:              Screening for colorectal malignant neoplasm, This                            is the patient's first colonoscopy Medicines:                Monitored Anesthesia Care Procedure:                Pre-Anesthesia Assessment:                           - Prior to the procedure, a History and Physical                            was performed, and patient medications and                            allergies were reviewed. The patient's tolerance of                            previous anesthesia was also reviewed. The risks                            and benefits of the procedure and the sedation                            options and risks were discussed with the patient.                            All questions were answered, and informed consent                            was obtained. Prior Anticoagulants: The patient has                            taken no anticoagulant or antiplatelet agents. ASA                            Grade Assessment: II - A patient with mild systemic                            disease. After reviewing the risks and benefits,                            the patient was deemed in satisfactory condition to                            undergo the procedure.  After obtaining informed consent, the colonoscope                            was passed under direct vision. Throughout the                            procedure, the patient's blood pressure, pulse, and                            oxygen saturations were monitored continuously. The                            Olympus CF-HQ190L [id number]) Colonoscope was                            introduced through the  anus and advanced to the the                            terminal ileum, with identification of the                            appendiceal orifice and IC valve. The colonoscopy                            was performed without difficulty. The patient                            tolerated the procedure well. The quality of the                            bowel preparation was adequate. The terminal ileum,                            ileocecal valve, appendiceal orifice, and rectum                            were photographed. The bowel preparation used was                            SUPREP via split dose instruction. Scope In: 11:32:02 AM Scope Out: 11:46:30 AM Scope Withdrawal Time: 0 hours 9 minutes 39 seconds  Total Procedure Duration: 0 hours 14 minutes 28 seconds  Findings:                 The perianal and digital rectal examinations were                            normal. Pertinent negatives include normal                            sphincter tone and no palpable rectal lesions.                           Many flat and sessile, non-bleeding polyps were  found in the rectum and sigmoid colon. The polyps                            were 1 to 4 mm in size. One of these polyps was                            removed with a cold snare. Resection and retrieval                            were complete. Estimated blood loss was minimal.                           The exam was otherwise normal throughout the                            examined colon.                           The terminal ileum appeared normal.                           The retroflexed view of the distal rectum and anal                            verge was normal and showed no anal or rectal                            abnormalities. Complications:            No immediate complications. Estimated Blood Loss:     Estimated blood loss was minimal. Impression:               - Many 1 to 4 mm, non-bleeding polyps  in the rectum                            and in the sigmoid colon, one was removed with a                            cold snare. Resected and retrieved. These appeared                            consistent with hyperplastic polyps.                           - The examined portion of the ileum was normal.                           - The distal rectum and anal verge are normal on                            retroflexion view. Recommendation:           - Patient has a contact number available for  emergencies. The signs and symptoms of potential                            delayed complications were discussed with the                            patient. Return to normal activities tomorrow.                            Written discharge instructions were provided to the                            patient.                           - Resume previous diet.                           - Continue present medications.                           - Await pathology results.                           - Repeat colonoscopy (date not yet determined) for                            surveillance based on pathology results. [name] E. Candis Schatz, MD 06/27/2022 11:56:31 AM This report has been signed electronically.

## 2022-06-27 NOTE — Progress Notes (Signed)
Called to room to assist during endoscopic procedure.  Patient ID and intended procedure confirmed with present staff. Received instructions for my participation in the procedure from the performing physician.  

## 2022-06-27 NOTE — Progress Notes (Signed)
[facility] History and Physical   Primary Care Physician:  Leeanne Rio, MD   Reason for Procedure:   Colon cancer screening  Plan:    Screening colonoscopy     HPI: Sandra Briggs is a 46 y.o. female undergoing initial average risk screening colonoscopy.  She has no family history of colon cancer and no chronic GI symptoms.    Past Medical History:  Diagnosis Date   Anxiety    Chronic pain    flank   Complication of anesthesia    Dysuria-frequency syndrome    Exercise-induced asthma    Panic disorder    Sprain of MCL (medial collateral ligament) of knee     Past Surgical History:  Procedure Laterality Date   APPENDECTOMY     CESAREAN SECTION     CRYOABLATION N/A    ENDOMETRIAL ABLATION  2009   novasure   TUBAL LIGATION  06/28/2005   WISDOM TOOTH EXTRACTION  2000    Prior to Admission medications   Medication Sig Start Date End Date Taking? Authorizing Provider  cetirizine (ZYRTEC ALLERGY) 10 MG tablet Take 1 tablet (10 mg total) by mouth 2 (two) times daily. 05/23/22  Yes August Albino, MD  gabapentin (NEURONTIN) 300 MG capsule Take 1 capsule (300 mg total) by mouth 3 (three) times daily. 10/20/20  Yes Simmons-Robinson, Makiera, MD  HYDROcodone-acetaminophen (NORCO) 10-325 MG tablet Take 1 tablet by mouth every 12 (twelve) hours. 05/03/22  Yes Leeanne Rio, MD  ipratropium (ATROVENT) 0.06 % nasal spray USE 2 SPRAYS PER NOSTRIL 3-4 TIMES A DAY AS NEEDED 05/03/22  Yes Leeanne Rio, MD  montelukast (SINGULAIR) 10 MG tablet TAKE 1 TABLET BY MOUTH EVERYDAY AT BEDTIME 05/03/22  Yes Leeanne Rio, MD  famotidine (PEPCID) 20 MG tablet Take 1 tablet (20 mg total) by mouth 2 (two) times daily. Patient not taking: Reported on 06/13/2022 05/23/22   August Albino, MD  HYDROcodone-acetaminophen [facility]) 10-325 MG tablet Take 1 tablet by mouth every 12 (twelve) hours. Patient not taking: Reported on 06/27/2022 05/07/22   Lind Covert, MD   HYDROcodone-acetaminophen (NORCO) 5-325 MG tablet Take 2 tablets by mouth 2 (two) times daily as needed for severe pain. Patient not taking: Reported on 06/27/2022 06/07/22   Leeanne Rio, MD  ibuprofen (ADVIL,MOTRIN) 200 MG tablet Take 400 mg by mouth 2 (two) times daily as needed.    [provider]    Current Outpatient Medications  Medication Sig Dispense Refill   cetirizine (ZYRTEC ALLERGY) 10 MG tablet Take 1 tablet (10 mg total) by mouth 2 (two) times daily. 20 tablet 0   gabapentin (NEURONTIN) 300 MG capsule Take 1 capsule (300 mg total) by mouth 3 (three) times daily. 270 capsule 0   HYDROcodone-acetaminophen (NORCO) 10-325 MG tablet Take 1 tablet by mouth every 12 (twelve) hours. 60 tablet 0   ipratropium (ATROVENT) 0.06 % nasal spray USE 2 SPRAYS PER NOSTRIL 3-4 TIMES A DAY AS NEEDED 45 mL 3   montelukast (SINGULAIR) 10 MG tablet TAKE 1 TABLET BY MOUTH EVERYDAY AT BEDTIME 90 tablet 1   famotidine (PEPCID) 20 MG tablet Take 1 tablet (20 mg total) by mouth 2 (two) times daily. (Patient not taking: Reported on 06/13/2022) 20 tablet 0   HYDROcodone-acetaminophen (NORCO) 10-325 MG tablet Take 1 tablet by mouth every 12 (twelve) hours. (Patient not taking: Reported on 06/27/2022) 60 tablet 0   HYDROcodone-acetaminophen (NORCO) 5-325 MG tablet Take 2 tablets by mouth 2 (two) times daily  as needed for severe pain. (Patient not taking: Reported on 06/27/2022) 120 tablet 0   ibuprofen (ADVIL,MOTRIN) 200 MG tablet Take 400 mg by mouth 2 (two) times daily as needed.     Current Facility-Administered Medications  Medication Dose Route Frequency Provider Last Rate Last Admin   0.9 %  sodium chloride infusion  500 mL Intravenous Once Daryel November, MD        Allergies as of 06/27/2022 - Review Complete 06/27/2022  Allergen Reaction Noted   Bee venom Anaphylaxis 04/12/2013   Sulfonamide derivatives Anaphylaxis 01/17/2010   Tramadol hcl Hives 01/17/2010    Family History   Problem Relation Age of Onset   Lung cancer Mother    Allergic rhinitis Mother    Fibroids Sister    Allergic rhinitis Sister    Allergic rhinitis Maternal Aunt    Allergic rhinitis Maternal Uncle    Hyperlipidemia Maternal Grandmother    Allergic rhinitis Maternal Grandfather    Eczema Daughter    Food Allergy Son    Asthma Neg Hx    Urticaria Neg Hx    Immunodeficiency Neg Hx    Atopy Neg Hx    Angioedema Neg Hx    Colon cancer Neg Hx    Colon polyps Neg Hx    Esophageal cancer Neg Hx    Rectal cancer Neg Hx    Stomach cancer Neg Hx     Social History   Socioeconomic History   Marital status: Divorced    Spouse name: Ysidro Evert   Number of children: 3   Years of education: Not on file   Highest education level: Not on file  Occupational History   Occupation: Best boy: [employer]  Tobacco Use   Smoking status: Former    Types: Cigarettes    Quit date: 02/27/2011    Years since quitting: 11.3   Smokeless tobacco: Former    Quit date: 10/22/2010  Vaping Use   Vaping Use: Never used  Substance and Sexual Activity   Alcohol use: Yes    Comment: rare   Drug use: No   Sexual activity: Yes    Birth control/protection: Surgical  Other Topics Concern   Not on file  Social History Narrative   Not on file   Social Determinants of Health   Financial Resource Strain: Not on file  Food Insecurity: No Food Insecurity (08/06/2021)   Hunger Vital Sign    Worried About Running Out of Food in the Last Year: Never true    Ran Out of Food in the Last Year: Never true  Transportation Needs: No Transportation Needs (08/06/2021)   PRAPARE - Hydrologist (Medical): No    Lack of Transportation (Non-Medical): No  Physical Activity: Not on file  Stress: Not on file  Social Connections: Not on file  Intimate Partner Violence: Not on file    Review of Systems:  All other review of systems negative except as mentioned  in the HPI.  Physical Exam: Vital signs BP (!) 145/73   Pulse 92   Temp 98.4 F (36.9 C)   Ht '5\' 4"'$  (1.626 m)   Wt 176 lb (79.8 kg)   SpO2 98%   BMI 30.21 kg/m   General:   Alert,  Well-developed, well-nourished, pleasant and cooperative in NAD Airway:  Mallampati 2 Lungs:  Clear throughout to auscultation.   Heart:  Regular rate and rhythm; no murmurs, clicks, rubs,  or gallops.  Abdomen:  Soft, nontender and nondistended. Normal bowel sounds.   Neuro/Psych:  Normal mood and affect. A and O x 3   Joycie Aerts E. Candis Schatz, MD [facility]

## 2022-06-27 NOTE — Patient Instructions (Addendum)
-   Patient has a contact number available for emergencies. The signs and symptoms of potential delayed complications were discussed with the patient. Return to normal activities tomorrow. Written discharge instructions were provided to the patient. - Resume previous diet. - Continue present medications. - Await pathology results. - Repeat colonoscopy (date not yet determined) for surveillance based on pathology results.  YOU HAD AN ENDOSCOPIC PROCEDURE TODAY AT [facility]:   Refer to the procedure report that was given to you for any specific questions about what was found during the examination.  If the procedure report does not answer your questions, please call your gastroenterologist to clarify.  If you requested that your care partner not be given the details of your procedure findings, then the procedure report has been included in a sealed envelope for you to review at your convenience later.  YOU SHOULD EXPECT: Some feelings of bloating in the abdomen. Passage of more gas than usual.  Walking can help get rid of the air that was put into your GI tract during the procedure and reduce the bloating. If you had a lower endoscopy (such as a colonoscopy or flexible sigmoidoscopy) you may notice spotting of blood in your stool or on the toilet paper. If you underwent a bowel prep for your procedure, you may not have a normal bowel movement for a few days.  Please Note:  You might notice some irritation and congestion in your nose or some drainage.  This is from the oxygen used during your procedure.  There is no need for concern and it should clear up in a day or so.  SYMPTOMS TO REPORT IMMEDIATELY:  Following lower endoscopy (colonoscopy or flexible sigmoidoscopy):  Excessive amounts of blood in the stool  Significant tenderness or worsening of abdominal pains  Swelling of the abdomen that is new, acute  Fever of 100F or higher  For urgent or emergent issues, a  gastroenterologist can be reached at any hour by calling [phone]. Do not use MyChart messaging for urgent concerns.    DIET:  We do recommend a small meal at first, but then you may proceed to your regular diet.  Drink plenty of fluids but you should avoid alcoholic beverages for 24 hours.  ACTIVITY:  You should plan to take it easy for the rest of today and you should NOT DRIVE or use heavy machinery until tomorrow (because of the sedation medicines used during the test).    FOLLOW UP: Our staff will call the number listed on your records the next business day following your procedure.  We will call around 7:15- 8:00 am to check on you and address any questions or concerns that you may have regarding the information given to you following your procedure. If we do not reach you, we will leave a message.     If any biopsies were taken you will be contacted by phone or by letter within the next 1-3 weeks.  Please call us at [phone] if you have not heard about the biopsies in 3 weeks.    SIGNATURES/CONFIDENTIALITY: You and/or your care partner have signed paperwork which will be entered into your electronic medical record.  These signatures attest to the fact that that the information above on your After Visit Summary has been reviewed and is understood.  Full responsibility of the confidentiality of this discharge information lies with you and/or your care-partner.

## 2022-06-27 NOTE — Progress Notes (Signed)
Cell phone off per pt Pt's states no medical or surgical changes since previsit or office visit.  

## 2022-06-27 NOTE — Progress Notes (Signed)
Report to PACU, RN, vss, BBS= Clear.  

## 2022-06-28 ENCOUNTER — Telehealth: Payer: Self-pay

## 2022-06-28 NOTE — Telephone Encounter (Signed)
  Follow up Call-     06/27/2022   10:04 AM  Call back number  Post procedure Call Back phone  # [phone]  Permission to leave phone message Yes     Patient questions:  Do you have a fever, pain , or abdominal swelling? No. Pain Score  0 *  Have you tolerated food without any problems? Yes.    Have you been able to return to your normal activities? Yes.    Do you have any questions about your discharge instructions: Diet   No. Medications  No. Follow up visit  No.  Do you have questions or concerns about your Care? No.  Actions: * If pain score is 4 or above: No action needed, pain <4.

## 2022-07-04 DIAGNOSIS — R55 Syncope and collapse: Secondary | ICD-10-CM | POA: Diagnosis not present

## 2022-07-04 DIAGNOSIS — R000 Tachycardia, unspecified: Secondary | ICD-10-CM | POA: Diagnosis not present

## 2022-07-04 DIAGNOSIS — G4489 Other headache syndrome: Secondary | ICD-10-CM | POA: Diagnosis not present

## 2022-07-04 DIAGNOSIS — R42 Dizziness and giddiness: Secondary | ICD-10-CM | POA: Diagnosis not present

## 2022-07-04 NOTE — Progress Notes (Signed)
Sandra Briggs,  Good news: the polyp that I removed during your recent examination was NOT precancerous.  You should continue to follow current colorectal cancer screening guidelines with a repeat colonoscopy in 10 years.    If you develop any new rectal bleeding, abdominal pain or significant bowel habit changes, please contact me before then.

## 2022-07-05 ENCOUNTER — Telehealth: Payer: Self-pay

## 2022-07-05 NOTE — Patient Outreach (Signed)
  Care Coordination TOC Note Transition Care Management Follow-up Telephone Call Date of discharge and from where: [facility] 11.16.23 How have you been since you were released from the hospital? "It was the strangest thing, I had a headache all day and then the next thing I saw stars and passed out.  The ambulance took me to [facility] and I had a blood pressure of 180/100.  I waited in the emergency room for 12 hours and was not seen, before I left". Any questions or concerns? Yes-Very concerned about her blood pressure and requests to be seen at [facility].  Message sent to [facility] front desk staff requesting patient appointment.  Items Reviewed: Did the pt receive and understand the discharge instructions provided?  Patient was not discharged from ED as he left after 12 hours Medications obtained and verified?  N/A Other?  N/A Any new allergies since your discharge? No  Dietary orders reviewed? No Do you have support at home? Yes   Home Care and Equipment/Supplies: Were home health services ordered? not applicable If so, what is the name of the agency? N/A  Has the agency set up a time to come to the patient's home? not applicable Were any new equipment or medical supplies ordered?  No What is the name of the medical supply agency? N/A Were you able to get the supplies/equipment? not applicable Do you have any questions related to the use of the equipment or supplies? No  Functional Questionnaire: (I = Independent and D = Dependent) ADLs: I  Bathing/Dressing- I  Meal Prep- I  Eating- I  Maintaining continence- I  Transferring/Ambulation- I  Managing Meds- I  Follow up appointments reviewed:  PCP Hospital f/u appt confirmed? No   Specialist Hospital f/u appt confirmed? No   Are transportation arrangements needed? No  If their condition worsens, is the pt aware to call PCP or go to the Emergency Dept.? Yes Was the patient provided with contact information for the PCP's office or ED?  Yes Was to pt encouraged to call back with questions or concerns? Yes  SDOH assessments and interventions completed:   Yes  Care Coordination Interventions Activated:  Yes   Care Coordination Interventions:  PCP follow up appointment requested   Encounter Outcome:  Pt. Visit Completed

## 2022-07-08 ENCOUNTER — Encounter: Payer: Self-pay | Admitting: Family Medicine

## 2022-07-08 MED ORDER — HYDROCODONE-ACETAMINOPHEN 5-325 MG PO TABS: 2.0000 | ORAL_TABLET | Freq: Two times a day (BID) | ORAL | 0 refills | Status: DC | PRN

## 2022-07-08 NOTE — Telephone Encounter (Signed)
FYI - I scheduled [name] to see you on Wednesday AM due to a syncopal episode recently Looks like she was taken to [facility] ER and had EKG but never was seen after waiting >7 hours.  No labs, had very elevated BP in setting of headache and diarrhea Thank you in advance for seeing her!  Sandra Briggs

## 2022-07-09 NOTE — Progress Notes (Signed)
SUBJECTIVE:   CHIEF COMPLAINT / HPI:   Syncope Woke feeling tired and moderate headache took her usual gabapentin and hydrocodone.  At work was walking to a counter and witness said she crumpled.  Did not injury anything and awoke within minutes.  No incontinence or tongue biting or post event confusion.  EMS said blood pressure was high other wise no findings.  Went to ER where had ECG but was not seen for hours and left Since feeling normal except tired and mild headache that does not interfere with activity.  No new visual changes or weakness or vomiting or URI symptoms or irregular heart beat.  Does mention transient episodic L eye visual decrease sometimes when has almost fallen asleep in bed and resolves immediately on sitting up   Never had syncope No OTC or recreational drugs  EKG report from 11/16 ER Visit "ECG Results - ECG 12-lead (07/04/2022 11:53 AM EST) Narrative  [facility] GE MUSE RESULTS - 07/04/2022 11:53 AM EST  This result has an attachment that is not available.  Normal sinus rhythm with sinus arrhythmia ST elevation, probably due to early repolarization RSR' in V1 or V2 Otherwise normal ECG No previous ECGs available I reviewed and concur with this report. Electronically signed JY:NWGNF, MD, RAVI ([id number]) on 07/04/2022 2:27:59 PM   "  OBJECTIVE:   BP (!) 148/83   Pulse 88   Wt 177 lb 6.4 oz (80.5 kg)   SpO2 100%   BMI 30.45 kg/m   Alert normal cognition Heart - Regular rate and rhythm.  No murmurs, gallops or rubs.    Lungs:  Normal respiratory effort, chest expands symmetrically. Lungs are clear to auscultation, no crackles or wheezes. Eye - Pupils Equal Round Reactive to light, Extraocular movements intact, Fundi without hemorrhage or visible lesions, Conjunctiva without redness or discharge Neurologic exam : Cn 2-7 intact Strength equal & normal in upper & lower extremities Able to walk on heels and toes.   Balance normal  Romberg normal, finger to  nose normal Mobility:able to get up and down from exam table without assistance or distress   ASSESSMENT/PLAN:   Syncope, unspecified syncope type Assessment & Plan: No clear cause.  No signs of cardiac or neurologic causes most likely vasovagal perhaps related to work, possible migraine and gabapentin and hydrocodone. Will check CMET and CBC and monitor symptoms and blood pressure at home with follow up with PCP  Orders: -     CMP14+EGFR -     CBC     Patient Instructions  Good to see you today - Thank you for coming in  Things we discussed today:  If you have chest pain or shortness of breath or focal weakness or visual changes or nearly pass out or anything new symptoms call us  Your goal blood pressure is less than 140/90.  Check your blood pressure several times a week.  If regularly higher than this please let me know - either with MyChart or leaving a phone message. Next visit please bring in your blood pressure cuff.     I will call you if your tests are not good.  Otherwise, I will send you a message on MyChart (if it is active) or a letter in the mail..  If you do not hear from me with in 2 weeks please call our office.     Please always bring your medication bottles  Come back to see me in Dr Pollie Meyer in one month  Carney Living, MD [facility]

## 2022-07-10 ENCOUNTER — Encounter: Payer: Self-pay | Admitting: Family Medicine

## 2022-07-10 ENCOUNTER — Ambulatory Visit: Payer: BC Managed Care – PPO | Admitting: Family Medicine

## 2022-07-10 ENCOUNTER — Other Ambulatory Visit: Payer: Self-pay

## 2022-07-10 VITALS — BP 148/83 | HR 88 | Wt 177.4 lb

## 2022-07-10 DIAGNOSIS — R55 Syncope and collapse: Secondary | ICD-10-CM

## 2022-07-10 NOTE — Patient Instructions (Signed)
Good to see you today - Thank you for coming in  Things we discussed today:  If you have chest pain or shortness of breath or focal weakness or visual changes or nearly pass out or anything new symptoms call us  Your goal blood pressure is less than 140/90.  Check your blood pressure several times a week.  If regularly higher than this please let me know - either with MyChart or leaving a phone message. Next visit please bring in your blood pressure cuff.     I will call you if your tests are not good.  Otherwise, I will send you a message on MyChart (if it is active) or a letter in the mail..  If you do not hear from me with in 2 weeks please call our office.     Please always bring your medication bottles  Come back to see me in Dr [name] in one month

## 2022-07-10 NOTE — Assessment & Plan Note (Signed)
No clear cause.  No signs of cardiac or neurologic causes most likely vasovagal perhaps related to work, possible migraine and gabapentin and hydrocodone. Will check CMET and CBC and monitor symptoms and blood pressure at home with follow up with PCP

## 2022-07-11 LAB — CBC
Hematocrit: 42.4 % (ref 34.0–46.6)
Hemoglobin: 14.5 g/dL (ref 11.1–15.9)
MCH: 32.1 pg (ref 26.6–33.0)
MCHC: 34.2 g/dL (ref 31.5–35.7)
MCV: 94 fL (ref 79–97)
Platelets: 214 10*3/uL (ref 150–450)
RBC: 4.52 x10E6/uL (ref 3.77–5.28)
RDW: 11.4 % — ABNORMAL LOW (ref 11.7–15.4)
WBC: 9.6 10*3/uL (ref 3.4–10.8)

## 2022-07-11 LAB — CMP14+EGFR
ALT: 16 IU/L (ref 0–32)
AST: 21 IU/L (ref 0–40)
Albumin/Globulin Ratio: 2.4 — ABNORMAL HIGH (ref 1.2–2.2)
Albumin: 4.6 g/dL (ref 3.9–4.9)
Alkaline Phosphatase: 81 IU/L (ref 44–121)
BUN/Creatinine Ratio: 18 (ref 9–23)
BUN: 13 mg/dL (ref 6–24)
Bilirubin Total: <0.2 mg/dL (ref 0.0–1.2)
CO2: 22 mmol/L (ref 20–29)
Calcium: 9.1 mg/dL (ref 8.7–10.2)
Chloride: 103 mmol/L (ref 96–106)
Creatinine, Ser: 0.73 mg/dL (ref 0.57–1.00)
Globulin, Total: 1.9 g/dL (ref 1.5–4.5)
Glucose: 104 mg/dL — ABNORMAL HIGH (ref 70–99)
Potassium: 4.2 mmol/L (ref 3.5–5.2)
Sodium: 140 mmol/L (ref 134–144)
Total Protein: 6.5 g/dL (ref 6.0–8.5)
eGFR: 103 mL/min/{1.73_m2} (ref >59)

## 2022-07-16 ENCOUNTER — Telehealth: Payer: Self-pay | Admitting: Family Medicine

## 2022-07-16 NOTE — Telephone Encounter (Signed)
*  AFTER HOURS EMERGENCY CALL*  Returned call to Ms. Sandra Briggs at [phone]. Phone went directly to voicemail. Called again immediately, patient answered.   Patient reports feeling fine before she went to bed, but woke up around 12:30 am with fever and numerous symptoms. Fever Tmax of 102.4*F. Symptoms include very bad pulsatile headache (similar to HA morning before syncopal episode 11/16), muscle aches of legs and back, and dry mouth.   Denies rhinorrhea, cough, nausea, vomiting, diarrhea No vision changes, SOB, focal neurological deficits  Full vaccinated for flu and COVID. No known recent sick contacts at school or work. Drinking normally (lots of ginger ale for dry mouth) and normal UOP last 24 hours.   Home BP on cuff, taken while on phone, was 153/96, pulse 120.   Discussed with patient. No red flags, I do not believe she needs to go to the ED now. Recommend urgent care first thing in the morning given they are able to perform same-day flu and COVID testing and [facility] is not. Discussed ED precautions with patient, she voices understanding.   Recommend symptomatic relief with tylenol, ibuprofen, honey, and ice packs.   Ezequiel Essex, MD

## 2022-08-01 ENCOUNTER — Encounter: Payer: Self-pay | Admitting: Family Medicine

## 2022-08-01 ENCOUNTER — Ambulatory Visit (INDEPENDENT_AMBULATORY_CARE_PROVIDER_SITE_OTHER): Payer: BC Managed Care – PPO | Admitting: Family Medicine

## 2022-08-01 VITALS — BP 143/84 | HR 80 | Ht 64.0 in | Wt 178.4 lb

## 2022-08-01 DIAGNOSIS — I10 Essential (primary) hypertension: Secondary | ICD-10-CM | POA: Diagnosis not present

## 2022-08-01 DIAGNOSIS — Z23 Encounter for immunization: Secondary | ICD-10-CM

## 2022-08-01 DIAGNOSIS — N941 Unspecified dyspareunia: Secondary | ICD-10-CM

## 2022-08-01 DIAGNOSIS — G8929 Other chronic pain: Secondary | ICD-10-CM

## 2022-08-01 DIAGNOSIS — R5383 Other fatigue: Secondary | ICD-10-CM | POA: Diagnosis not present

## 2022-08-01 MED ORDER — HYDROCHLOROTHIAZIDE 12.5 MG PO CAPS: 12.5000 mg | ORAL_CAPSULE | Freq: Every day | ORAL | 1 refills | Status: DC

## 2022-08-01 NOTE — Patient Instructions (Signed)
It was great to see you again today!  Start HCTZ 12.'5mg'$  daily. Checking thyroid today Ordered pelvic u/s and sleep study Refilled medications  Schedule lab visit in 3-4 weeks to recheck kidneys, along with nurse visit to recheck blood pressure   Be well, Dr. [name]

## 2022-08-01 NOTE — Progress Notes (Signed)
  Date of Visit: 08/01/2022   SUBJECTIVE:   HPI:  Sandra Briggs presents today for routine follow up.  Elevated blood pressure - noted several times recently. Has checked blood pressures at home as well and has gotten >140/80 consistently.  Fatigue - feels very tired. She does snore at night. Has never had sleep study.  Pelvic pain - intercourse is painful. Previously saw GYN for this and was going to get pelvic u/s but never did. Wants to schedule follow up with them and to get the u/s.  Chronic pain - continues to take norco 5-'325mg'$  2 pills twice daily. Has noticed more pain relief with switching to 5-'325mg'$  (possibly due to increased acetaminophen component). Storing medication safely in a lock box.  OBJECTIVE:   BP (!) 143/84   Pulse 80   Ht '5\' 4"'$  (1.626 m)   Wt 178 lb 6.4 oz (80.9 kg)   SpO2 100%   BMI 30.62 kg/m  Gen: no acute distress, pleasant cooperative HEENT: normocephalic, atraumatic  Heart: regular rate and rhythm, no murmur Lungs: clear to auscultation bilaterally, normal work of breathing  Neuro: alert, speech normal, grossly nonfocal Ext: No appreciable lower extremity edema bilaterally   ASSESSMENT/PLAN:   Health maintenance:  -COVID booster given today -update lipids at fasting lab visit in a few weeks  Hypertension Consistently elevated blood pressures.  Start HCTZ 12.5 mg daily.  Follow-up for BMP and nurse blood pressure check in several weeks.  Also ordered sleep study given fatigue and new onset hypertension.  Encounter for chronic pain management Pain improved with higher component of acetaminophen.  Continue with hydrocodone-acetaminophen 5-325 mg 2 tablets twice daily as needed for pain.  PDMP reviewed and is appropriate.  Benefits of continuing medication outweigh risks at this time.  Refilled for 3 months.  Dyspareunia in female Encouraged to follow-up with GYN.  Reorder pelvic ultrasound so that that result is available at the time of her  appointment.  Fatigue Recent normal evaluation with CBC and CMP.  Check thyroid studies today.  Also ordered split-night study due to reported snoring.  Untreated sleep apnea could explain her fatigue and elevated blood pressures.  FOLLOW UP: Follow up in 3-4 wks for RN BP check and BMET  [name], [place]

## 2022-08-02 LAB — TSH: TSH: 1.03 u[IU]/mL (ref 0.450–4.500)

## 2022-08-02 LAB — T4, FREE: Free T4: 1.37 ng/dL (ref 0.82–1.77)

## 2022-08-03 DIAGNOSIS — I10 Essential (primary) hypertension: Secondary | ICD-10-CM | POA: Insufficient documentation

## 2022-08-03 MED ORDER — HYDROCODONE-ACETAMINOPHEN 5-325 MG PO TABS: 2.0000 | ORAL_TABLET | Freq: Two times a day (BID) | ORAL | 0 refills | Status: DC | PRN

## 2022-08-03 NOTE — Assessment & Plan Note (Signed)
Pain improved with higher component of acetaminophen.  Continue with hydrocodone-acetaminophen 5-325 mg 2 tablets twice daily as needed for pain.  PDMP reviewed and is appropriate.  Benefits of continuing medication outweigh risks at this time.  Refilled for 3 months.

## 2022-08-03 NOTE — Assessment & Plan Note (Signed)
Encouraged to follow-up with GYN.  Reorder pelvic ultrasound so that that result is available at the time of her appointment.

## 2022-08-03 NOTE — Assessment & Plan Note (Signed)
Consistently elevated blood pressures.  Start HCTZ 12.5 mg daily.  Follow-up for BMP and nurse blood pressure check in several weeks.  Also ordered sleep study given fatigue and new onset hypertension.

## 2022-08-22 ENCOUNTER — Other Ambulatory Visit: Payer: BC Managed Care – PPO

## 2022-08-22 ENCOUNTER — Ambulatory Visit: Payer: BC Managed Care – PPO

## 2022-08-29 ENCOUNTER — Ambulatory Visit (HOSPITAL_BASED_OUTPATIENT_CLINIC_OR_DEPARTMENT_OTHER): Payer: BC Managed Care – PPO | Attending: Family Medicine | Admitting: Internal Medicine

## 2022-08-29 VITALS — Ht 64.0 in | Wt 178.0 lb

## 2022-08-29 DIAGNOSIS — G4733 Obstructive sleep apnea (adult) (pediatric): Secondary | ICD-10-CM | POA: Diagnosis not present

## 2022-08-29 DIAGNOSIS — R5383 Other fatigue: Secondary | ICD-10-CM | POA: Insufficient documentation

## 2022-09-01 DIAGNOSIS — R5383 Other fatigue: Secondary | ICD-10-CM | POA: Diagnosis not present

## 2022-09-01 NOTE — Procedures (Signed)
   Patient Name: Sandra Briggs, Sandra Briggs Date: 08/29/2022 Gender: Female D.O.B: 1976-06-22 Age (years): 46 Referring Provider: Leeanne Rio Height (inches): 64 Interpreting Physician: Baird Lyons MD, ABSM Weight (lbs): 178 RPSGT: Jorge Ny BMI: 31 MRN: [id number] Neck Size: 14.50  CLINICAL INFORMATION Sleep Study Type: NPSG Indication for sleep study: Fatigue, Hypertension, Obesity, Snoring Epworth Sleepiness Score: 7  SLEEP STUDY TECHNIQUE As per the AASM Manual for the Scoring of Sleep and Associated Events v2.3 (April 2016) with a hypopnea requiring 4% desaturations.  The channels recorded and monitored were frontal, central and occipital EEG, electrooculogram (EOG), submentalis EMG (chin), nasal and oral airflow, thoracic and abdominal wall motion, anterior tibialis EMG, snore microphone, electrocardiogram, and pulse oximetry.  MEDICATIONS Medications self-administered by patient taken the night of the study : N/A  SLEEP ARCHITECTURE The study was initiated at 10:12:13 PM and ended at 4:33:37 AM.  Sleep onset time was 21.5 minutes and the sleep efficiency was 87.6%. The total sleep time was 334 minutes.  Stage REM latency was 59.5 minutes.  The patient spent 3.4% of the night in stage N1 sleep, 77.2% in stage N2 sleep, 0.0% in stage N3 and 19.3% in REM.  Alpha intrusion was absent.  Supine sleep was 73.81%.  RESPIRATORY PARAMETERS The overall apnea/hypopnea index (AHI) was 7.0 per hour. There were 0 total apneas, including 0 obstructive, 0 central and 0 mixed apneas. There were 39 hypopneas and 10 RERAs.  The AHI during Stage REM sleep was 25.1 per hour.  AHI while supine was 9.2 per hour.  The mean oxygen saturation was 91.6%. The minimum SpO2 during sleep was 81.0%.  moderate snoring was noted during this study.  CARDIAC DATA The 2 lead EKG demonstrated sinus rhythm. The mean heart rate was 77.9 beats per minute. Other EKG findings include:  PACs.  LEG MOVEMENT DATA The total PLMS were 0 with a resulting PLMS index of 0.0. Associated arousal with leg movement index was 0.2 .  IMPRESSIONS - Mild obstructive sleep apnea occurred during this study (AHI = 7.0/h). - Mild oxygen desaturation was noted during this study (Min O2 = 81.0%). Mean 91.6% Time with O2 saturation 88% or less was 10.6 minutes. - The patient snored with moderate snoring volume. - Cardiac abnormalities were noted during this study- PACs. - Clinically significant periodic limb movements did not occur during sleep. No significant associated arousals.  DIAGNOSIS - Obstructive Sleep Apnea (G47.33)  RECOMMENDATIONS - Treatment for mild OSA is guided by symptoms and co-morbidity. Conservative measures may include observation, weight loss and sleep position off back. Other options including CPAP, a fitted oral appliance, or ENT evaluation, would be based on clinical judgment. - Be careful with alcohol, sedatives and other CNS depressants that may worsen sleep apnea and disrupt normal sleep architecture. - Sleep hygiene should be reviewed to assess factors that may improve sleep quality. - Weight management and regular exercise should be initiated or continued if appropriate.  [Electronically signed] 09/01/2022 12:55 PM  Baird Lyons MD, [place], American Board of Sleep Medicine NPI: [id number]                          [place], [place] [facility]  ELECTRONICALLY SIGNED ON:  09/01/2022, 12:48 PM [place] PH: [phone]   FX: [phone] [place]

## 2022-09-06 ENCOUNTER — Encounter: Payer: Self-pay | Admitting: Family Medicine

## 2022-09-06 DIAGNOSIS — J351 Hypertrophy of tonsils: Secondary | ICD-10-CM

## 2022-09-06 DIAGNOSIS — G4733 Obstructive sleep apnea (adult) (pediatric): Secondary | ICD-10-CM

## 2022-09-20 NOTE — Telephone Encounter (Signed)
Community message sent to [employer]. Will await response.   Aniqua Briere C Daneli Butkiewicz, RN  

## 2022-09-23 NOTE — Telephone Encounter (Signed)
Receipt confirmed by [employer].   Phinneas Shakoor C Kollin Udell, RN  

## 2022-11-04 ENCOUNTER — Encounter: Payer: Self-pay | Admitting: Family Medicine

## 2022-11-04 MED ORDER — HYDROCODONE-ACETAMINOPHEN 5-325 MG PO TABS: 2.0000 | ORAL_TABLET | Freq: Two times a day (BID) | ORAL | 0 refills | Status: DC | PRN

## 2022-11-25 ENCOUNTER — Ambulatory Visit: Payer: BC Managed Care – PPO | Admitting: Family Medicine

## 2022-11-25 ENCOUNTER — Encounter: Payer: Self-pay | Admitting: Family Medicine

## 2022-11-25 ENCOUNTER — Other Ambulatory Visit: Payer: Self-pay

## 2022-11-25 VITALS — BP 141/80 | HR 97 | Ht 64.0 in | Wt 176.4 lb

## 2022-11-25 DIAGNOSIS — M542 Cervicalgia: Secondary | ICD-10-CM | POA: Diagnosis not present

## 2022-11-25 DIAGNOSIS — M2550 Pain in unspecified joint: Secondary | ICD-10-CM | POA: Diagnosis not present

## 2022-11-25 DIAGNOSIS — G8929 Other chronic pain: Secondary | ICD-10-CM

## 2022-11-25 DIAGNOSIS — I10 Essential (primary) hypertension: Secondary | ICD-10-CM | POA: Diagnosis not present

## 2022-11-25 DIAGNOSIS — M79641 Pain in right hand: Secondary | ICD-10-CM | POA: Diagnosis not present

## 2022-11-25 DIAGNOSIS — M79642 Pain in left hand: Secondary | ICD-10-CM

## 2022-11-25 MED ORDER — HYDROCODONE-ACETAMINOPHEN 5-325 MG PO TABS: 2.0000 | ORAL_TABLET | Freq: Two times a day (BID) | ORAL | 0 refills | Status: DC | PRN

## 2022-11-25 MED ORDER — HYDROCODONE-ACETAMINOPHEN 5-325 MG PO TABS: 2.0000 | ORAL_TABLET | Freq: Three times a day (TID) | ORAL | 0 refills | Status: DC | PRN

## 2022-11-25 NOTE — Assessment & Plan Note (Deleted)
Neck pain well controlled on current Norco regimen. Will increase frequency to 3x per day for 10 days for mouth pain after dental implants and then continue 2x per day if pain is well controlled.

## 2022-11-25 NOTE — Patient Instructions (Signed)
It was great to see you again today.  Refilled pain medications for 3 months For this month, giving extra pills so that you can take an extra dose for 10 days immediately after your dental surgery.  Schedule ambulatory blood pressure monitoring with Dr. Raymondo Band.  Labs today, referring to rheumatology for your hands.  Follow up with me in 3 months, sooner if needed  Be well, Dr. Pollie Meyer

## 2022-11-25 NOTE — Assessment & Plan Note (Signed)
Elevated on initial and repeat BP today. Schedule for ambulatory BP monitoring and then adjust medications as needed.

## 2022-11-25 NOTE — Assessment & Plan Note (Signed)
Swelling present on PIP and DIP bilaterally, concerned for rheumatologic condition. Ordered ESR, RF, ANA, anti-CCP, CRP today. Sent referral to rheumatology.

## 2022-11-25 NOTE — Progress Notes (Signed)
    SUBJECTIVE:   CHIEF COMPLAINT / HPI:   Sandra Briggs is a 47 yo female with chronic neck pain and HTN who presents to the clinic for pain medication management before a surgical procedure. She is having elective dental implants on 12/12/22 after recurrent tooth infections. She has been on Norco for many years and needs guidance and prescription for post op pain management. Pt says that her neck pain is currently well controlled and that her teeth/mouth are most painful today. Denies headache, weakness, SOB, chest pain.  PERTINENT  PMH / PSH:  Chronic neck pain Pseudogout HTN   OBJECTIVE:   BP (!) 141/80   Pulse 97   Ht 5\' 4"  (1.626 m)   Wt 176 lb 6.4 oz (80 kg)   SpO2 99%   BMI 30.28 kg/m    Gen: well appearing female in no acute distress HEENT: normocephalic, atraumatic CV: RRR, no MRG Pulm: CTAB, normal WOB MSK: Nodules on interphalangeal joints bilaterally, most noticeable on 5th DIPs Neuro: strength and sensation equal bilaterally   ASSESSMENT/PLAN:   Hypertension Elevated on initial and repeat BP today. Schedule for ambulatory BP monitoring and then adjust medications as needed.  Cervicalgia Neck pain well controlled on current Norco regimen. Will increase frequency to 3x per day for 10 days for mouth pain after dental implants and then continue 2x per day if pain is well controlled.  Hand pain Swelling present on PIP and DIP bilaterally, concerned for rheumatologic condition. Ordered ESR, RF, ANA, anti-CCP, CRP today. Sent referral to rheumatology.     Barrett Shell, Medical Student [facility]

## 2022-11-26 LAB — ANA: Anti Nuclear Antibody (ANA): NEGATIVE

## 2022-11-26 LAB — RHEUMATOID FACTOR: Rheumatoid fact SerPl-aCnc: <10 IU/mL (ref <14.0)

## 2022-11-26 LAB — C-REACTIVE PROTEIN: CRP: 1 mg/L (ref 0–10)

## 2022-11-26 LAB — SEDIMENTATION RATE: Sed Rate: 9 mm/hr (ref 0–32)

## 2022-11-27 LAB — CYCLIC CITRUL PEPTIDE ANTIBODY, IGG/IGA: Cyclic Citrullin Peptide Ab: 5 units (ref 0–19)

## 2022-11-27 NOTE — Assessment & Plan Note (Signed)
Neck pain well controlled on current Norco regimen. Will briefly increase frequency to 3x per day for 10 days for mouth pain after dental implants and then continue 2x per day if pain is well controlled. PDMP reviewed with appropriate findings. Benefits of therapy continue to outweigh risks.

## 2022-12-25 ENCOUNTER — Ambulatory Visit: Payer: BC Managed Care – PPO | Admitting: Pharmacist

## 2022-12-31 ENCOUNTER — Ambulatory Visit: Payer: BC Managed Care – PPO | Admitting: Pharmacist

## 2023-03-05 ENCOUNTER — Encounter: Payer: Self-pay | Admitting: Family Medicine

## 2023-03-06 MED ORDER — HYDROCODONE-ACETAMINOPHEN 5-325 MG PO TABS: 2.0000 | ORAL_TABLET | Freq: Two times a day (BID) | ORAL | 0 refills | Status: DC | PRN

## 2023-03-06 MED ORDER — HYDROCHLOROTHIAZIDE 12.5 MG PO CAPS: 12.5000 mg | ORAL_CAPSULE | Freq: Every day | ORAL | 0 refills | Status: DC

## 2023-03-11 ENCOUNTER — Ambulatory Visit (INDEPENDENT_AMBULATORY_CARE_PROVIDER_SITE_OTHER): Payer: BC Managed Care – PPO | Admitting: Family Medicine

## 2023-03-11 ENCOUNTER — Encounter: Payer: Self-pay | Admitting: Family Medicine

## 2023-03-11 VITALS — BP 130/75 | HR 70 | Ht 64.0 in | Wt 175.4 lb

## 2023-03-11 DIAGNOSIS — I10 Essential (primary) hypertension: Secondary | ICD-10-CM | POA: Diagnosis not present

## 2023-03-11 DIAGNOSIS — G8929 Other chronic pain: Secondary | ICD-10-CM

## 2023-03-11 NOTE — Patient Instructions (Signed)
It was great to see you again today.  Will send in refills for you Checking kidney function today  Follow up in October  Be well, Dr. Pollie Meyer

## 2023-03-11 NOTE — Progress Notes (Unsigned)
  Date of Visit: 03/11/2023   SUBJECTIVE:   HPI:  Sandra Briggs presents today for routine follow up.  Chronic pain: Overall doing well.  Currently taking Norco 5-325 mg 2 tablets twice a day as needed for severe pain.  Takes this twice a day fairly consistently.  Denies any adverse side effects from it.  She is storing the medication safely in a locked box.  The pain continues to be manageable due to this medication.  Also takes gabapentin 300 mg 3 times a day with good relief.  Hypertension: Currently taking HCTZ 12.5 mg once daily.  Tolerating this well.   OBJECTIVE:   BP 130/75 (BP Location: Left Arm, Patient Position: Sitting, Cuff Size: Normal)   Pulse 70   Ht 5\' 4"  (1.626 m)   Wt 175 lb 6.4 oz (79.6 kg)   SpO2 99%   BMI 30.11 kg/m  Gen: No acute distress, pleasant, cooperative HEENT: Normocephalic, atraumatic, full range of motion of neck Heart: Regular rate and rhythm, no murmur Lungs: Clear to auscultation bilaterally, normal effort Neuro: Alert, grossly nonfocal, speech normal, grip 4 out of 5 on left, 5 out of 5 on right, consistent with prior exams  ASSESSMENT/PLAN:   Health maintenance:  -Up-to-date on health maintenance items  Encounter for chronic pain management Pain stable.  Benefits of continuing medication outweigh risks.  Will refill for 3 months, follow-up in 3 months in office.  Hypertension Well-controlled, check BMET today, continue HCTZ.  FOLLOW UP: Follow up in 3 mos for chronic pain  [place] J. Pollie Meyer, MD [facility]

## 2023-03-12 LAB — BASIC METABOLIC PANEL
BUN/Creatinine Ratio: 16 (ref 9–23)
BUN: 13 mg/dL (ref 6–24)
CO2: 26 mmol/L (ref 20–29)
Calcium: 9.4 mg/dL (ref 8.7–10.2)
Chloride: 102 mmol/L (ref 96–106)
Creatinine, Ser: 0.81 mg/dL (ref 0.57–1.00)
Glucose: 83 mg/dL (ref 70–99)
Potassium: 4.6 mmol/L (ref 3.5–5.2)
Sodium: 140 mmol/L (ref 134–144)
eGFR: 91 mL/min/{1.73_m2} (ref >59)

## 2023-03-13 MED ORDER — HYDROCODONE-ACETAMINOPHEN 5-325 MG PO TABS: 2.0000 | ORAL_TABLET | Freq: Two times a day (BID) | ORAL | 0 refills | Status: DC | PRN

## 2023-03-13 NOTE — Assessment & Plan Note (Signed)
Well-controlled, check BMET today, continue HCTZ.

## 2023-03-13 NOTE — Assessment & Plan Note (Signed)
Pain stable.  Benefits of continuing medication outweigh risks.  Will refill for 3 months, follow-up in 3 months in office.

## 2023-04-07 ENCOUNTER — Ambulatory Visit: Payer: BC Managed Care – PPO

## 2023-04-07 ENCOUNTER — Ambulatory Visit: Payer: BC Managed Care – PPO | Attending: Internal Medicine | Admitting: Internal Medicine

## 2023-04-07 ENCOUNTER — Ambulatory Visit (INDEPENDENT_AMBULATORY_CARE_PROVIDER_SITE_OTHER): Payer: BC Managed Care – PPO

## 2023-04-07 ENCOUNTER — Encounter: Payer: Self-pay | Admitting: Internal Medicine

## 2023-04-07 VITALS — BP 147/85 | HR 81 | Resp 12 | Ht 65.0 in | Wt 174.0 lb

## 2023-04-07 DIAGNOSIS — M1120 Other chondrocalcinosis, unspecified site: Secondary | ICD-10-CM

## 2023-04-07 DIAGNOSIS — M79641 Pain in right hand: Secondary | ICD-10-CM

## 2023-04-07 DIAGNOSIS — R202 Paresthesia of skin: Secondary | ICD-10-CM

## 2023-04-07 DIAGNOSIS — R200 Anesthesia of skin: Secondary | ICD-10-CM | POA: Insufficient documentation

## 2023-04-07 DIAGNOSIS — M79642 Pain in left hand: Secondary | ICD-10-CM | POA: Diagnosis not present

## 2023-04-07 NOTE — Progress Notes (Signed)
Office Visit Note  Patient: Sandra Briggs             Date of Birth: 1976/03/10           MRN: [id number]             PCP: Latrelle Dodrill, MD Referring: Latrelle Dodrill, MD Visit Date: 04/07/2023 Occupation: Occupational hygienist  Subjective:  New Patient (Initial Visit) (Patient states she has pain in her hands, neck, hips sometimes, and feet sometimes. Patient states she gets a painful and itchy rash sometimes but does not currently have one. )   History of Present Illness: Sandra Briggs is a 47 y.o. female here for evaluation of joint pain and swelling with increased nodules in PIP and DIP joints of both hands.  She has noticed the symptoms more for the past about 1 year the more of a gradual or insidious onset.  At first he was just having hand pain and stiffness intermittently now symptoms are more every day most severe for a few minutes in the morning but stays to some extent throughout the entire day.  Lab tests at PCP office were negative for ANA, RF, CCP and had normal serum inflammatory markers.  She takes ibuprofen 800 mg every day which is moderately effective.  Pain is also partially improved with her oral pain medication that was prescribed more for her neck problems. 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 Was thought to be a contributor for past neck pain but never on specific mediation for this. Has had multiple kidney stones about every 2 years for many times but last was 4-5 years ago.  She gets left hand numbness intermittently and is found to have decreased grip strength.  Sometimes has burning type pain in her hand this comes and goes provoked with driving, sleeping, or other routine activities.  Not clear if this was due to chronic cervical radiculopathy she never had a nerve conduction study.  A newer symptom has been some intermittent skin rash that is  mildly painful itchy and redness is coming and going lasting several days at a time.  Had evaluation when this broke out on the left side of her face had a concern for shingles but progressed to involve right side ultimately attributed to some kind of atopic dermatitis treated with steroids.  She has a family history of arthritis reports one grandmother had RA and other family members with "regular arthritis."  Labs reviewed 11/2022 ANA neg RF neg CCP neg  12/27/2013 MRI Cervical spine IMPRESSION:  Straightened cervical lordosis without acute fracture nor malalignment.  Degenerative change of the mid/lower cervical spine with resultant mild canal stenosis at C5-6, no neural foraminal narrowing.  Focal low signal posterior to the odontoid process suggest pannus which can be seen with CPPD. Findings less likely reflect vascular lesion/ chronic hematoma.   Activities of Daily Living:  Patient reports morning stiffness for 24 hours.   Patient Reports nocturnal pain.  Difficulty dressing/grooming: Denies Difficulty climbing stairs: Denies Difficulty getting out of chair: Denies Difficulty using hands for taps, buttons, cutlery, and/or writing: Reports  Review of Systems  Constitutional:  Positive for fatigue.  HENT:  Negative for mouth sores and mouth dryness.   Eyes:  Positive for dryness.  Respiratory:  Negative for shortness of breath.   Cardiovascular:  Negative for chest pain and palpitations.  Gastrointestinal:  Negative for blood in stool, constipation and diarrhea.  Endocrine: Negative for increased urination.  Genitourinary:  Negative for involuntary urination.  Musculoskeletal:  Positive for joint pain, joint pain, joint swelling, myalgias, muscle weakness, morning stiffness, muscle tenderness and myalgias. Negative for gait problem.  Skin:  Positive for color change, rash and sensitivity to sunlight. Negative for hair loss.  Allergic/Immunologic: Positive for susceptible to  infections.  Neurological:  Positive for headaches. Negative for dizziness.  Hematological:  Negative for swollen glands.  Psychiatric/Behavioral:  Negative for depressed mood and sleep disturbance. The patient is not nervous/anxious.     PMFS History:  Patient Active Problem List   Diagnosis Date Noted   Numbness and tingling in left hand 04/07/2023   Other fatigue 08/29/2022   Hypertension 08/03/2022   Syncope 07/10/2022   Dyspareunia in female 08/06/2021   Healthcare maintenance 11/28/2020   Rash and nonspecific skin eruption 11/19/2017   Postcoital bleeding 08/18/2017   Hand pain 08/18/2017   Encounter for chronic pain management 09/04/2014   Shortness of breath 03/04/2014   Pseudogout 12/30/2013   Overweight (BMI 25.0-29.9) 02/27/2012   Cervicalgia 09/07/2010   Bipolar disorder (HCC) 07/02/2007   PANIC DISORDER 06/08/2007   Allergic rhinitis 10/16/2006    Past Medical History:  Diagnosis Date   Anxiety    Chronic pain    flank   Complication of anesthesia    DDD (degenerative disc disease), cervical    DDD (degenerative disc disease), lumbar    Dysuria-frequency syndrome    Exercise-induced asthma    Hypertension    Panic disorder    Pseudogout    Sprain of MCL (medial collateral ligament) of knee     Family History  Problem Relation Age of Onset   Lung cancer Mother    Allergic rhinitis Mother    Fibroids Sister    Allergic rhinitis Sister    Hyperlipidemia Maternal Grandmother    Allergic rhinitis Maternal Grandfather    Eczema Daughter    Food Allergy Son    Allergic rhinitis Maternal Aunt    Allergic rhinitis Maternal Uncle    Asthma Neg Hx    Urticaria Neg Hx    Immunodeficiency Neg Hx    Atopy Neg Hx    Angioedema Neg Hx    Colon cancer Neg Hx    Colon polyps Neg Hx    Esophageal cancer Neg Hx    Rectal cancer Neg Hx    Stomach cancer Neg Hx    Past Surgical History:  Procedure Laterality Date   APPENDECTOMY     CESAREAN SECTION      CRYOABLATION N/A    ENDOMETRIAL ABLATION  2009   novasure   TUBAL LIGATION  06/28/2005   WISDOM TOOTH EXTRACTION  2000   Social History   Social History Narrative   Not on file   Immunization History  Administered Date(s) Administered   COVID-19, mRNA, vaccine(Comirnaty)12 years and older 08/01/2022   Influenza Split 06/11/2011, 06/24/2012   Influenza Whole 08/03/2010   Influenza,inj,Quad PF,6+ Mos 05/14/2013, 08/29/2014, 05/13/2016, 08/25/2018, 04/27/2019, 07/27/2020, 06/28/2021, 05/23/2022   PFIZER Comirnaty(Gray Top)Covid-19 Tri-Sucrose Vaccine 11/28/2020   PFIZER(Purple Top)SARS-COV-2 Vaccination 01/25/2020, 04/20/2020   Pfizer Covid-19 Vaccine Bivalent Booster 58yrs & up 06/28/2021   Tdap 06/11/2011, 06/28/2021     Objective: Vital Signs: BP (!) 147/85 (BP Location: Left Arm, Patient Position: Sitting, Cuff Size: Normal)   Pulse 81   Resp 12   Ht 5\' 5"  (1.651 m)   Wt 174 lb (78.9  kg)   BMI 28.96 kg/m    Physical Exam HENT:     Mouth/Throat:     Mouth: Mucous membranes are moist.     Pharynx: Oropharynx is clear.  Eyes:     Conjunctiva/sclera: Conjunctivae normal.  Cardiovascular:     Rate and Rhythm: Normal rate and regular rhythm.  Pulmonary:     Effort: Pulmonary effort is normal.     Breath sounds: Normal breath sounds.  Musculoskeletal:     Right lower leg: No edema.     Left lower leg: No edema.  Lymphadenopathy:     Cervical: No cervical adenopathy.  Skin:    General: Skin is warm and dry.     Findings: No rash.  Neurological:     Mental Status: She is alert.  Psychiatric:        Mood and Affect: Mood normal.      Musculoskeletal Exam:  Shoulders full ROM no tenderness or swelling Elbows full ROM no tenderness or swelling Wrists full ROM, no swelling, left wrist some tenderness and radiating pain with percussion Fingers full ROM, heberdon's nodes throughout PIP and DIP joints both hands, worst 5th DIPs, tenderness to pressure, worst with some  swelling on dorsal side at left 4th and 5th DIPs Knees full ROM no tenderness or swelling Ankles full ROM no tenderness or swelling MTPs full ROM no tenderness or swelling   Investigation: No additional findings.  Imaging: XR Hand 2 View Left  Result Date: 04/07/2023 X-ray left hand 2 views Radiocarpal joint space appears normal.  Carpal joints appear normal.  MCP joints appear normal.  There is some joint space narrowing and lateral osteophytes at PIP and DIP joints most advanced at second and fifth digits.  No erosions or abnormal calcifications seen.  Bone mineralization appears normal. 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  Mild cystic changes present in the carpal bones.  MCP joints appear normal.  There is some joint space narrowing and lateral osteophytes at PIP and DIP joints most advanced at second and fifth digits.  No erosions or abnormal calcifications seen.  Bone mineralization appears normal. 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  Procedures:  No procedures performed Allergies: Bee venom, Sulfonamide derivatives, and Tramadol hcl   Assessment / Plan:     Visit Diagnoses: Pain in both hands - Plan: XR Hand 2 View Right, XR Hand 2 View Left  Pain in  both hands looks consistent with osteoarthritis.  Distal joint distribution and with degenerative radiographic changes.  With no synovitis do not recommend repeating that previously negative labs.  The few swollen areas look most consistent with digital mucinous cyst overlying involved joints.  Discussed  symptomatic treatment such as NSAIDs nutritional supplements or topical therapies.  Not sure if there is any relationship with the described new skin rashes.  Can follow-up again as needed if seeing symptom exacerbation.  Pseudogout  Previous diagnosis based on inflammatory symptoms radiographic changes at the odontoid process but not having any episodes of acute inflammation now.  X-ray of both hands is negative for any chondrocalcinosis.  Has had a history of recurrent kidney stones untested so probably calcium oxalate but not for few years do not think requires any new maintenance treatment.  Numbness and tingling in left hand  Intermittent numbness and pain sensation in the left hand appears suspicious for carpal tunnel syndrome by exam and history today.  Though cannot exclude more proximal nerve impingement but does not have any radiating symptoms throughout the arm.  Will provide with recommended range of motion exercises and wrist splinting advice for initial treatment.  If symptoms worsening or failure to improve could refer for nerve conduction study.  Orders: Orders Placed This Encounter  Procedures   XR Hand 2 View Right   XR Hand 2 View Left   No orders of the defined types were placed in this encounter.    Follow-Up Instructions: No follow-ups on file.   Fuller Plan, MD  Note - This record has been created using [employer].  Chart creation errors have been sought, but may not always  have been located. Such creation errors do not reflect on  the standard of medical care.

## 2023-04-07 NOTE — Patient Instructions (Addendum)
For osteoarthritis several treatments may be beneficial:  - Topical antiinflammatory medicine such as diclofenac or Voltaren can be applied to  affected area as needed. Topical analgesics containing CBD, menthol, or lidocaine can be tried.  - Oral nonsteroidal antiinflammatory drugs (NSAIDs) such as ibuprofen, aleve, celebrex, or mobic are usually helpful for osteoarthritis. These should be taken intermittently or as needed, and always taken with food.  - Turmeric has some antiinflammatory effect similar to NSAIDs and may help, if taken as a supplement should not be taken above recommended doses.   - Compressive gloves or sleeve can be helpful to support the joint especially if hurting or swelling with certain activities.  - Physical therapy referral can discuss exercises or activity modification to improve symptoms or strength if needed.  - Local steroid injection is an option if symptoms become worse and not controlled by the above options.    The finger swelling on left hand 4th and 5th finger look like digital mucinous cysts. These are not dangerous or harmful but represent underlying osteoarthritis.

## 2023-06-02 ENCOUNTER — Other Ambulatory Visit: Payer: Self-pay | Admitting: Family Medicine

## 2023-06-25 ENCOUNTER — Encounter: Payer: Self-pay | Admitting: Family Medicine

## 2023-06-26 ENCOUNTER — Other Ambulatory Visit: Payer: Self-pay | Admitting: Family Medicine

## 2023-06-26 DIAGNOSIS — G8929 Other chronic pain: Secondary | ICD-10-CM

## 2023-06-26 MED ORDER — HYDROCODONE-ACETAMINOPHEN 5-325 MG PO TABS
2.0000 | ORAL_TABLET | Freq: Two times a day (BID) | ORAL | 0 refills | Status: DC | PRN
Start: 2023-06-26 — End: 2023-07-21

## 2023-06-26 NOTE — Progress Notes (Signed)
Refilled 1 month hydrocodone, PDMP reviewed and appropriate.

## 2023-07-21 ENCOUNTER — Encounter: Payer: Self-pay | Admitting: Family Medicine

## 2023-07-21 ENCOUNTER — Ambulatory Visit: Payer: BC Managed Care – PPO | Admitting: Family Medicine

## 2023-07-21 VITALS — BP 134/80 | HR 83 | Ht 65.0 in | Wt 185.2 lb

## 2023-07-21 DIAGNOSIS — Z0000 Encounter for general adult medical examination without abnormal findings: Secondary | ICD-10-CM | POA: Diagnosis not present

## 2023-07-21 DIAGNOSIS — G4733 Obstructive sleep apnea (adult) (pediatric): Secondary | ICD-10-CM

## 2023-07-21 DIAGNOSIS — G8929 Other chronic pain: Secondary | ICD-10-CM

## 2023-07-21 DIAGNOSIS — Z23 Encounter for immunization: Secondary | ICD-10-CM

## 2023-07-21 DIAGNOSIS — I10 Essential (primary) hypertension: Secondary | ICD-10-CM | POA: Diagnosis not present

## 2023-07-21 DIAGNOSIS — M542 Cervicalgia: Secondary | ICD-10-CM

## 2023-07-21 DIAGNOSIS — N930 Postcoital and contact bleeding: Secondary | ICD-10-CM

## 2023-07-21 MED ORDER — HYDROCODONE-ACETAMINOPHEN 5-325 MG PO TABS
2.0000 | ORAL_TABLET | Freq: Two times a day (BID) | ORAL | 0 refills | Status: DC | PRN
Start: 2023-07-21 — End: 2023-10-18

## 2023-07-21 MED ORDER — HYDROCODONE-ACETAMINOPHEN 5-325 MG PO TABS
2.0000 | ORAL_TABLET | Freq: Two times a day (BID) | ORAL | 0 refills | Status: DC | PRN
Start: 2023-07-21 — End: 2023-10-08

## 2023-07-21 NOTE — Progress Notes (Unsigned)
  Date of Visit: 07/21/2023   SUBJECTIVE:   HPI:  Sandra Briggs presents today for follow-up.  Chronic pain: Currently taking Norco 5-325 mg 2 tablets twice a day as needed for pain.  Tolerating this regimen well.  It helps her pain and keeps her functional.  She continues to store it safely and denies any adverse effects from the medication.  Also takes gabapentin 300 mg 3 times a day and ibuprofen several times per week.  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  Is amenable to having a different company reach out.  Postcoital bleeding: Desires referral back to gynecology, was told that her prior gynecologist is retiring.  Hypertension: Currently taking HCTZ 12.5 mg daily.   OBJECTIVE:   BP 134/80   Pulse 83   Ht 5\' 5"  (1.651 m)   Wt 185 lb 3.2 oz (84 kg)   SpO2 95%   BMI 30.82 kg/m  Gen: No acute distress, pleasant, cooperative, well-appearing HEENT: Normocephalic, atraumatic, full range of motion of neck Lungs: Normal work of breathing on room air Neuro: Alert, grossly nonfocal, speech normal, grip 5 out of 5 on right and 4 out of 5 on left Ext: No edema  ASSESSMENT/PLAN:   Assessment & Plan Encounter for chronic pain management Overall stable.  PDMP reviewed with appropriate findings.  Benefits of medication continue to outweigh risks.  Refilled for 3 months.  Follow-up in 3 months. Routine adult health maintenance Flu and COVID vaccines given today OSA (obstructive sleep apnea) I will send a message to our nurse team to see if a company other than [employer] can supply her with CPAP Hypertension, unspecified type Well-controlled, continue current regimen Postcoital bleeding Referral back to OB/GYN, Dr. Macon Large    FOLLOW UP: Follow up in 3 months for above issues  Sandra J. Pollie Meyer, MD [facility]

## 2023-07-21 NOTE — Patient Instructions (Addendum)
It was great to see you again today.  Sent in refills of pain medication for you Flu and COVID shots today Referring back to Dr. Macon Large Schedule follow up in February  Be well, Dr. Pollie Meyer

## 2023-07-23 DIAGNOSIS — G4733 Obstructive sleep apnea (adult) (pediatric): Secondary | ICD-10-CM | POA: Insufficient documentation

## 2023-07-23 DIAGNOSIS — Z0000 Encounter for general adult medical examination without abnormal findings: Secondary | ICD-10-CM | POA: Insufficient documentation

## 2023-07-23 NOTE — Assessment & Plan Note (Signed)
Flu and COVID vaccines given today

## 2023-07-23 NOTE — Assessment & Plan Note (Signed)
Well-controlled, continue current regimen 

## 2023-07-23 NOTE — Assessment & Plan Note (Signed)
Overall stable.  PDMP reviewed with appropriate findings.  Benefits of medication continue to outweigh risks.  Refilled for 3 months.  Follow-up in 3 months.

## 2023-07-23 NOTE — Assessment & Plan Note (Signed)
Referral back to OB/GYN, Dr. Macon Large

## 2023-07-23 NOTE — Assessment & Plan Note (Signed)
I will send a message to our nurse team to see if a company other than [employer] can supply her with CPAP

## 2023-08-04 ENCOUNTER — Encounter: Payer: Self-pay | Admitting: Family Medicine

## 2023-10-06 ENCOUNTER — Encounter: Payer: Self-pay | Admitting: Family Medicine

## 2023-10-06 DIAGNOSIS — G8929 Other chronic pain: Secondary | ICD-10-CM

## 2023-10-08 MED ORDER — HYDROCODONE-ACETAMINOPHEN 5-325 MG PO TABS: 2.0000 | ORAL_TABLET | Freq: Two times a day (BID) | ORAL | 0 refills | Status: DC | PRN

## 2023-10-16 ENCOUNTER — Ambulatory Visit (INDEPENDENT_AMBULATORY_CARE_PROVIDER_SITE_OTHER): Payer: BC Managed Care – PPO | Admitting: Family Medicine

## 2023-10-16 ENCOUNTER — Encounter: Payer: Self-pay | Admitting: Family Medicine

## 2023-10-16 VITALS — BP 138/78 | HR 85 | Ht 64.0 in | Wt 185.8 lb

## 2023-10-16 DIAGNOSIS — G4733 Obstructive sleep apnea (adult) (pediatric): Secondary | ICD-10-CM

## 2023-10-16 DIAGNOSIS — G8929 Other chronic pain: Secondary | ICD-10-CM

## 2023-10-16 DIAGNOSIS — Z0000 Encounter for general adult medical examination without abnormal findings: Secondary | ICD-10-CM

## 2023-10-16 DIAGNOSIS — M542 Cervicalgia: Secondary | ICD-10-CM

## 2023-10-16 DIAGNOSIS — I10 Essential (primary) hypertension: Secondary | ICD-10-CM | POA: Diagnosis not present

## 2023-10-16 DIAGNOSIS — Z79899 Other long term (current) drug therapy: Secondary | ICD-10-CM | POA: Diagnosis not present

## 2023-10-16 DIAGNOSIS — Z23 Encounter for immunization: Secondary | ICD-10-CM

## 2023-10-16 NOTE — Progress Notes (Signed)
  Date of Visit: 10/16/2023   SUBJECTIVE:   HPI:  Sandra Briggs presents today for routine follow-up.  Chronic pain: Currently taking hydrocodone 5-325 mg 2 tablets twice a day as needed, gabapentin 300 mg 3 times a day, and ibuprofen as needed.  Tolerating this well.  Storing medication safely.  Denies any adverse effects or difficulty controlling how often she is using the medication.  She got it filled last week, and still has a prescription on file.  Amenable to UDS today.  Last dose of medication was this morning, also took it yesterday.  Hypertension: Currently taking HCTZ 12.5 mg daily.  Tolerating this well.  OSA: Still has not gotten CPAP, needs to reach out to the company to get set up   OBJECTIVE:   BP 138/78   Pulse 85   Ht 5\' 4"  (1.626 m)   Wt 185 lb 12.8 oz (84.3 kg)   SpO2 95%   BMI 31.89 kg/m  Gen: No acute distress, pleasant, cooperative, well-appearing HEENT: Normocephalic, atraumatic, normal range of motion of neck Lungs: Normal effort on room air Neuro: Alert, grossly nonfocal, speech normal, grip 5 out of 5 on right and 4 out of 5 on left (consistent with prior exams)  ASSESSMENT/PLAN:   Assessment & Plan Encounter for chronic pain management Stable, will send in 2 more months worth (has one on file) Benefits of ongoing therapy outweigh risks Check UDS today Okay for her to get March's prescription filled early (3/11 or 3/12) since she will be going out of town, with expectation that April's will be filled at the regular time. OSA (obstructive sleep apnea) Has not yet gotten CPAP, she knows she can work on this Hypertension, unspecified type Well controlled. Continue current medication regimen.    Note -was given COVID booster today by CMA prior to me entering the room, but on review of records was not yet due (already had fall 2024 COVID booster). Discussed with patient that I do not anticipate this will cause her any harm, will just give her additional  protection.  FOLLOW UP: Follow up in 3 months for above issues  [place] J. Pollie Meyer, MD [facility]

## 2023-10-16 NOTE — Patient Instructions (Signed)
 It was great to see you again today.  Checking urine today Ok to fill next prescription on 3/11 or 3/12, with next one in April to be at the regular time  Will send prescriptions for April and May  Follow up with me in 3 months   Be well, Dr. Pollie Meyer

## 2023-10-18 LAB — DRUG SCREEN (9), URINE
Amphetamines, Urine: NEGATIVE ng/mL
Barbiturate screen, urine: NEGATIVE ng/mL
Benzodiazepine Quant, Ur: NEGATIVE ng/mL
Cannabinoid Quant, Ur: NEGATIVE ng/mL
Cocaine (Metab.): NEGATIVE ng/mL
Methadone: NEGATIVE ng/mL
OPIATE SCREEN URINE: POSITIVE ng/mL — AB
PCP Quant, Ur: NEGATIVE ng/mL
Propoxyphene: NEGATIVE ng/mL

## 2023-10-18 MED ORDER — HYDROCODONE-ACETAMINOPHEN 5-325 MG PO TABS: 2.0000 | ORAL_TABLET | Freq: Two times a day (BID) | ORAL | 0 refills | Status: DC | PRN

## 2023-10-18 NOTE — Assessment & Plan Note (Signed)
 Well controlled. Continue current medication regimen.

## 2023-10-18 NOTE — Assessment & Plan Note (Signed)
 Stable, will send in 2 more months worth (has one on file) Benefits of ongoing therapy outweigh risks Check UDS today Okay for her to get March's prescription filled early (3/11 or 3/12) since she will be going out of town, with expectation that April's will be filled at the regular time.

## 2023-10-18 NOTE — Assessment & Plan Note (Signed)
 Has not yet gotten CPAP, she knows she can work on this

## 2023-10-28 ENCOUNTER — Encounter: Payer: Self-pay | Admitting: Family Medicine

## 2023-10-28 NOTE — Telephone Encounter (Signed)
 Called pharmacy. [facility] is open, at least until 10 pm.   However, pharmacy does report that the absolute earliest they will be able to fill is the 19th.   Spoke with patient. She reports that Dr. Pollie Meyer is aware that she is going out of town and gave the okay to fill sooner.   Dr. Pollie Meyer- what would you recommend? Pharmacist was pretty adamant that they would not be able to fill today due to policy on narcotic refills.   Veronda Prude, RN

## 2023-10-28 NOTE — Telephone Encounter (Signed)
 I am fine with her getting it filled today - she does not request early fills ever. I tried to call [facility] myself to ask them to fill it but waited on hold for ~10 mins before having to hang up.  RN team can you try to call them again and give verbal permission for early fill this one time? If pharmacy refuses to fill I am not sure what else we can do.  Thank you Latrelle Dodrill, MD

## 2023-10-28 NOTE — Telephone Encounter (Signed)
 Called pharmacy. Pharmacy reports that per their protocol on controlled substances, they are unable to fill prior to 11/05/23, as there has not been a dose increase.   They recommend that we send prescription to pharmacy in patient's out of town location.   Called patient back to provide update. Patient will be in [place] and will not be back until 11/13/23.  Advised that I was unsure if PCP would be able to prescribe controlled substance in another state.   Please advise.   Veronda Prude, RN

## 2024-02-10 ENCOUNTER — Encounter: Payer: Self-pay | Admitting: Family Medicine

## 2024-02-10 DIAGNOSIS — G8929 Other chronic pain: Secondary | ICD-10-CM

## 2024-02-10 MED ORDER — HYDROCHLOROTHIAZIDE 12.5 MG PO CAPS: 12.5000 mg | ORAL_CAPSULE | Freq: Every day | ORAL | 1 refills | Status: DC

## 2024-02-10 MED ORDER — HYDROCODONE-ACETAMINOPHEN 5-325 MG PO TABS: 2.0000 | ORAL_TABLET | Freq: Two times a day (BID) | ORAL | 0 refills | Status: DC | PRN

## 2024-02-17 ENCOUNTER — Ambulatory Visit: Admitting: Family Medicine

## 2024-03-10 ENCOUNTER — Other Ambulatory Visit: Payer: Self-pay

## 2024-03-10 DIAGNOSIS — G8929 Other chronic pain: Secondary | ICD-10-CM

## 2024-03-10 MED ORDER — HYDROCODONE-ACETAMINOPHEN 5-325 MG PO TABS: 2.0000 | ORAL_TABLET | Freq: Two times a day (BID) | ORAL | 0 refills | Status: DC | PRN

## 2024-04-01 ENCOUNTER — Ambulatory Visit: Admitting: Family Medicine

## 2024-04-01 ENCOUNTER — Encounter: Payer: Self-pay | Admitting: Family Medicine

## 2024-04-01 VITALS — BP 138/80 | HR 90 | Ht 64.0 in | Wt 188.6 lb

## 2024-04-01 DIAGNOSIS — Z0184 Encounter for antibody response examination: Secondary | ICD-10-CM | POA: Diagnosis not present

## 2024-04-01 DIAGNOSIS — H1031 Unspecified acute conjunctivitis, right eye: Secondary | ICD-10-CM | POA: Diagnosis not present

## 2024-04-01 DIAGNOSIS — M79641 Pain in right hand: Secondary | ICD-10-CM

## 2024-04-01 DIAGNOSIS — Z0000 Encounter for general adult medical examination without abnormal findings: Secondary | ICD-10-CM

## 2024-04-01 DIAGNOSIS — G8929 Other chronic pain: Secondary | ICD-10-CM

## 2024-04-01 DIAGNOSIS — M79642 Pain in left hand: Secondary | ICD-10-CM

## 2024-04-01 DIAGNOSIS — R2232 Localized swelling, mass and lump, left upper limb: Secondary | ICD-10-CM

## 2024-04-01 DIAGNOSIS — I10 Essential (primary) hypertension: Secondary | ICD-10-CM

## 2024-04-01 DIAGNOSIS — R2231 Localized swelling, mass and lump, right upper limb: Secondary | ICD-10-CM | POA: Diagnosis not present

## 2024-04-01 DIAGNOSIS — M542 Cervicalgia: Secondary | ICD-10-CM

## 2024-04-01 MED ORDER — ERYTHROMYCIN 5 MG/GM OP OINT: 1.0000 | TOPICAL_OINTMENT | Freq: Three times a day (TID) | OPHTHALMIC | 0 refills | Status: DC

## 2024-04-01 NOTE — Progress Notes (Signed)
    SUBJECTIVE:   CHIEF COMPLAINT / HPI:   Sandra Briggs is here for medication refill and finger swelling.  Finger swelling She reports a recent flare of her chronic finger swelling. She has swelling, erythema, and tenderness involving the DIP and PIP joints of the 2nd and 5th digits bilaterally. There are small tender nodules in these areas as well.   She denies new trauma or systemic symptoms. Pain is worse with activity and improved somewhat with rest. She has a history of chronic hand pain, stiffness, and degenerative joint disease per rheumatology evaluation. She has tried NSAIDs and oral pain medications in the past with partial relief.  Chronic pain  Continues to take norco 5-325mg  2 pills twice daily. Stable on medication with no nausea, dizziness, or constipation. Storing medication safely, no side effects.  Conjunctivitis She reports redness in her right eye for the past few days. She has noticed crusting and sticky discharge from the eye in the morning. Denies vision changes, pain, or itching. Previously saw ophtho years ago for this and was given combination steroid/antibiotics drops   OBJECTIVE:   BP 138/80   Pulse 90   Ht 5' 4 (1.626 m)   Wt 188 lb 9.6 oz (85.5 kg)   SpO2 100%   BMI 32.37 kg/m   General: Well-appearing, NAD HEENT: Eyes: mild right conjunctival injection with mild crusting at lashes; no purulent drainage noted at time of exam. Pupils equal round and reactive to light. Extraocular movements intact  Cardio/Resp: Regular rate and rhythm, clear breath sounds Pulmonary: Normal WOB, clear to auscultation bilaterally MSK: Swelling, erythema, and tenderness to palpation over DIP and PIP joints of 2nd and 5th digits bilaterally; presence of nodules; decreased grip strength L>R (stable from prior exams) Skin: Erythema over affected joints; no open lesions   ASSESSMENT/PLAN:   Assessment & Plan Hypertension, unspecified type - BP elevated, came down to 138/80 on  recheck - Continue hydrochlorothiazide  at present dose - Check BMP Acute bacterial conjunctivitis of right eye - Likely bacterial conjunctivitis given redness, discharge, and crusting and prior improvement with antibiotic eye drops - Prescribed erythromycin  ointment ocular - Supportive care: warm compresses, avoid touching/rubbing eye, hand hygiene reinforced - Return precautions for worsening redness, vision changes, or pain Localized swelling of finger of both hands - Chronic degenerative changes with intermittent flare-ups - Continue NSAIDs PRN - refer to hand surgery for further eval, possible cysts Routine adult health maintenance - Checked lipids - check hep B titers Encounter for chronic pain management - PDMP reviewed and is appropriate - benefits continue to outweigh risks - Norco refilled   Sandra Briggs, Medical Student [facility]  Patient seen along with medical student Sandra Briggs. I personally evaluated this patient along with the student, and verified all aspects of the history, physical exam, and medical decision making as documented by the student. I agree with the student's documentation and have made all necessary edits.  Sandra JINNY Legions, MD, IBCLC [facility]

## 2024-04-01 NOTE — Assessment & Plan Note (Addendum)
-   Checked lipids - check hep B titers

## 2024-04-01 NOTE — Assessment & Plan Note (Addendum)
-   BP elevated, came down to 138/80 on recheck - Continue hydrochlorothiazide  at present dose - Check BMP

## 2024-04-01 NOTE — Assessment & Plan Note (Addendum)
-   PDMP reviewed and is appropriate - benefits continue to outweigh risks - Norco refilled

## 2024-04-01 NOTE — Patient Instructions (Signed)
 It was great to see you again today.  Checking labs - kidneys, electrolytes, hep B immunity, cholesterol  Referring to hand specialist  Sent in eye ointment for you, let me know if worsening  Be well, Dr. Donah

## 2024-04-02 ENCOUNTER — Ambulatory Visit: Payer: Self-pay | Admitting: Family Medicine

## 2024-04-02 LAB — LIPID PANEL
Chol/HDL Ratio: 6.4 ratio — ABNORMAL HIGH (ref 0.0–4.4)
Cholesterol, Total: 249 mg/dL — ABNORMAL HIGH (ref 100–199)
HDL: 39 mg/dL — ABNORMAL LOW (ref >39)
LDL Chol Calc (NIH): 194 mg/dL — ABNORMAL HIGH (ref 0–99)
Triglycerides: 91 mg/dL (ref 0–149)
VLDL Cholesterol Cal: 16 mg/dL (ref 5–40)

## 2024-04-02 LAB — HEPATITIS B SURFACE ANTIBODY, QUANTITATIVE: Hepatitis B Surf Ab Quant: <3.5 m[IU]/mL — ABNORMAL LOW

## 2024-04-02 LAB — BASIC METABOLIC PANEL WITH GFR
BUN/Creatinine Ratio: 18 (ref 9–23)
BUN: 14 mg/dL (ref 6–24)
CO2: 24 mmol/L (ref 20–29)
Calcium: 9.7 mg/dL (ref 8.7–10.2)
Chloride: 103 mmol/L (ref 96–106)
Creatinine, Ser: 0.8 mg/dL (ref 0.57–1.00)
Glucose: 95 mg/dL (ref 70–99)
Potassium: 4.8 mmol/L (ref 3.5–5.2)
Sodium: 141 mmol/L (ref 134–144)
eGFR: 91 mL/min/1.73 (ref >59)

## 2024-04-02 MED ORDER — ATORVASTATIN CALCIUM 40 MG PO TABS: 40.0000 mg | ORAL_TABLET | Freq: Every day | ORAL | 3 refills | Status: AC

## 2024-04-03 MED ORDER — HYDROCODONE-ACETAMINOPHEN 5-325 MG PO TABS
2.0000 | ORAL_TABLET | Freq: Two times a day (BID) | ORAL | 0 refills | Status: DC | PRN
Start: 2024-04-03 — End: 2024-07-08

## 2024-04-03 MED ORDER — HYDROCODONE-ACETAMINOPHEN 5-325 MG PO TABS: 2.0000 | ORAL_TABLET | Freq: Two times a day (BID) | ORAL | 0 refills | Status: DC | PRN

## 2024-04-27 ENCOUNTER — Telehealth: Payer: Self-pay | Admitting: Orthopaedic Surgery

## 2024-04-27 NOTE — Telephone Encounter (Signed)
 Sari sent me this referral for bil hand pains. Called pt unable to leave vm for mailbox is full. If pt calls will put with Jerri and attach referral

## 2024-05-03 ENCOUNTER — Emergency Department (HOSPITAL_COMMUNITY): Admission: EM | Admit: 2024-05-03 | Discharge: 2024-05-03 | Disposition: A | Discharge status: Home / Self Care

## 2024-05-03 ENCOUNTER — Other Ambulatory Visit: Payer: Self-pay

## 2024-05-03 ENCOUNTER — Emergency Department (HOSPITAL_COMMUNITY)

## 2024-05-03 ENCOUNTER — Other Ambulatory Visit (HOSPITAL_COMMUNITY)

## 2024-05-03 ENCOUNTER — Encounter (HOSPITAL_COMMUNITY): Payer: Self-pay

## 2024-05-03 DIAGNOSIS — R109 Unspecified abdominal pain: Secondary | ICD-10-CM | POA: Insufficient documentation

## 2024-05-03 DIAGNOSIS — I10 Essential (primary) hypertension: Secondary | ICD-10-CM | POA: Diagnosis not present

## 2024-05-03 DIAGNOSIS — N83291 Other ovarian cyst, right side: Secondary | ICD-10-CM | POA: Diagnosis not present

## 2024-05-03 DIAGNOSIS — K429 Umbilical hernia without obstruction or gangrene: Secondary | ICD-10-CM | POA: Diagnosis not present

## 2024-05-03 DIAGNOSIS — R102 Pelvic and perineal pain: Secondary | ICD-10-CM | POA: Diagnosis not present

## 2024-05-03 LAB — I-STAT CHEM 8, ED
BUN: 13 mg/dL (ref 6–20)
Calcium, Ion: 1.14 mmol/L — ABNORMAL LOW (ref 1.15–1.40)
Chloride: 106 mmol/L (ref 98–111)
Creatinine, Ser: 0.7 mg/dL (ref 0.44–1.00)
Glucose, Bld: 102 mg/dL — ABNORMAL HIGH (ref 70–99)
HCT: 40 % (ref 36.0–46.0)
Hemoglobin: 13.6 g/dL (ref 12.0–15.0)
Potassium: 4 mmol/L (ref 3.5–5.1)
Sodium: 140 mmol/L (ref 135–145)
TCO2: 23 mmol/L (ref 22–32)

## 2024-05-03 LAB — BASIC METABOLIC PANEL WITH GFR
Anion gap: 12 (ref 5–15)
BUN: 12 mg/dL (ref 6–20)
CO2: 23 mmol/L (ref 22–32)
Calcium: 9.1 mg/dL (ref 8.9–10.3)
Chloride: 105 mmol/L (ref 98–111)
Creatinine, Ser: 0.75 mg/dL (ref 0.44–1.00)
GFR, Estimated: >60 mL/min (ref >60)
Glucose, Bld: 101 mg/dL — ABNORMAL HIGH (ref 70–99)
Potassium: 4.5 mmol/L (ref 3.5–5.1)
Sodium: 140 mmol/L (ref 135–145)

## 2024-05-03 LAB — CBC
HCT: 42.2 % (ref 36.0–46.0)
Hemoglobin: 14 g/dL (ref 12.0–15.0)
MCH: 31.7 pg (ref 26.0–34.0)
MCHC: 33.2 g/dL (ref 30.0–36.0)
MCV: 95.5 fL (ref 80.0–100.0)
Platelets: 233 K/uL (ref 150–400)
RBC: 4.42 MIL/uL (ref 3.87–5.11)
RDW: 11.6 % (ref 11.5–15.5)
WBC: 9.4 K/uL (ref 4.0–10.5)
nRBC: 0 % (ref 0.0–0.2)

## 2024-05-03 LAB — URINALYSIS, ROUTINE W REFLEX MICROSCOPIC
Bacteria, UA: NONE SEEN
Bilirubin Urine: NEGATIVE
Glucose, UA: NEGATIVE mg/dL
Hgb urine dipstick: NEGATIVE
Ketones, ur: NEGATIVE mg/dL
Nitrite: NEGATIVE
Protein, ur: NEGATIVE mg/dL
Specific Gravity, Urine: 1.009 (ref 1.005–1.030)
pH: 6 (ref 5.0–8.0)

## 2024-05-03 LAB — HCG, SERUM, QUALITATIVE: Preg, Serum: NEGATIVE

## 2024-05-03 MED ORDER — IOHEXOL 350 MG/ML SOLN
75.0000 mL | Freq: Once | INTRAVENOUS | Status: AC | PRN
  Administered 2024-05-03: 75 mL via INTRAVENOUS

## 2024-05-03 MED ORDER — KETOROLAC TROMETHAMINE 15 MG/ML IJ SOLN
15.0000 mg | Freq: Once | INTRAMUSCULAR | Status: AC
  Administered 2024-05-03: 15 mg via INTRAVENOUS
  Filled 2024-05-03: qty 1

## 2024-05-03 MED ORDER — KETOROLAC TROMETHAMINE 10 MG PO TABS: 10.0000 mg | ORAL_TABLET | Freq: Four times a day (QID) | ORAL | 0 refills | Status: DC | PRN

## 2024-05-03 MED ORDER — OXYCODONE-ACETAMINOPHEN 5-325 MG PO TABS
1.0000 | ORAL_TABLET | Freq: Once | ORAL | Status: AC
  Administered 2024-05-03: 1 via ORAL
  Filled 2024-05-03: qty 1

## 2024-05-03 MED ORDER — ONDANSETRON HCL 4 MG/2ML IJ SOLN
4.0000 mg | Freq: Once | INTRAMUSCULAR | Status: AC
  Administered 2024-05-03: 4 mg via INTRAVENOUS
  Filled 2024-05-03: qty 2

## 2024-05-03 MED ORDER — ONDANSETRON 4 MG PO TBDP
4.0000 mg | ORAL_TABLET | Freq: Once | ORAL | Status: AC
  Administered 2024-05-03: 4 mg via ORAL
  Filled 2024-05-03: qty 1

## 2024-05-03 MED ORDER — MORPHINE SULFATE (PF) 4 MG/ML IV SOLN
4.0000 mg | Freq: Once | INTRAVENOUS | Status: AC
  Administered 2024-05-03: 4 mg via INTRAVENOUS
  Filled 2024-05-03: qty 1

## 2024-05-03 NOTE — Discharge Instructions (Addendum)
 Your CT scan and ultrasound here today were reassuring.  There is a small ovarian cyst but no findings concerning for ovarian torsion.  Your urinalysis was not convincing for urine infection.  Please take the ketorolac  and follow-up with your doctor for further evaluation.  Return to the ER for worsening symptoms.

## 2024-05-03 NOTE — ED Provider Notes (Signed)
  EMERGENCY DEPARTMENT AT [facility] Provider Note   CSN: [id number] Arrival date & time: 05/03/24  [id number]     Patient presents with: Flank Pain   Sandra Briggs is a 48 y.o. female.   48 year old female with past medical history of hypertension and kidney stones in the past presenting to the emergency department today right flank pain.  The patient states she has been having some pain mostly on the right side now for the past week or so.  She states she woke up this morning pain was a lot worse.  She states that the pain shoots around her right flank area.  She did have some nausea and vomiting with this this morning.  Reports normal bowel movements.  She states that she has had a history of C-sections in the past as well as an appendectomy.  She came to the emergency department today for further evaluation regarding this due to ongoing symptoms.  She denies any bowel or bladder dysfunction and has not had any saddle anesthesia.  She does report some increased urinary frequency and urgency.   Flank Pain       Prior to Admission medications   Medication Sig Start Date End Date Taking? Authorizing Provider  ketorolac  (TORADOL ) 10 MG tablet Take 1 tablet (10 mg total) by mouth every 6 (six) hours as needed. 05/03/24  Yes Ula Prentice SAUNDERS, MD  atorvastatin  (LIPITOR) 40 MG tablet Take 1 tablet (40 mg total) by mouth daily. 04/02/24   Donah Laymon PARAS, MD  cetirizine  (ZYRTEC  ALLERGY) 10 MG tablet Take 1 tablet (10 mg total) by mouth 2 (two) times daily. 05/23/22   Romelle Booty, MD  erythromycin  ophthalmic ointment Place 1 Application into the right eye 3 (three) times daily. 04/01/24   Donah Laymon PARAS, MD  gabapentin  (NEURONTIN ) 300 MG capsule Take 1 capsule (300 mg total) by mouth 3 (three) times daily. 10/20/20   Simmons-Robinson, Makiera, MD  hydrochlorothiazide  (MICROZIDE ) 12.5 MG capsule Take 1 capsule (12.5 mg total) by mouth daily. 02/10/24   Donah Laymon PARAS, MD   HYDROcodone -acetaminophen  (NORCO) 5-325 MG tablet Take 2 tablets by mouth 2 (two) times daily as needed for severe pain (pain score 7-10). 04/03/24   Donah Laymon PARAS, MD  HYDROcodone -acetaminophen  (NORCO) 5-325 MG tablet Take 2 tablets by mouth 2 (two) times daily as needed for severe pain (pain score 7-10). 04/03/24   Donah Laymon PARAS, MD  HYDROcodone -acetaminophen  (NORCO) 5-325 MG tablet Take 2 tablets by mouth 2 (two) times daily as needed for severe pain (pain score 7-10). 04/03/24   Donah Laymon PARAS, MD  montelukast  (SINGULAIR ) 10 MG tablet TAKE 1 TABLET BY MOUTH EVERYDAY AT BEDTIME 05/03/22   Donah Laymon PARAS, MD    Allergies: Bee venom, Sulfonamide derivatives, and Tramadol hcl    Review of Systems  Genitourinary:  Positive for flank pain.  All other systems reviewed and are negative.   Updated Vital Signs BP 138/81   Pulse 76   Temp 97.6 F (36.4 C) (Oral)   Resp 19   SpO2 100%   Physical Exam Vitals and nursing note reviewed.   Gen: Appears uncomfortable Eyes: PERRL, EOMI HEENT: no oropharyngeal swelling Neck: trachea midline Resp: clear to auscultation bilaterally Card: RRR, no murmurs, rubs, or gallops Abd: Right-sided CVA tenderness noted Extremities: no calf tenderness, no edema MSK: No thoracic or lumbar spinal tenderness is noted, there is right paraspinal tenderness noted Neuro: Equal strength and sensation throughout bilateral upper and lower  extremities Vascular: 2+ radial pulses bilaterally, 2+ DP pulses bilaterally Skin: no rashes Psyc: acting appropriately   (all labs ordered are listed, but only abnormal results are displayed) Labs Reviewed  URINALYSIS, ROUTINE W REFLEX MICROSCOPIC - Abnormal; Notable for the following components:      Result Value   Color, Urine STRAW (*)    APPearance HAZY (*)    Leukocytes,Ua TRACE (*)    All other components within normal limits  BASIC METABOLIC PANEL WITH GFR - Abnormal; Notable for the  following components:   Glucose, Bld 101 (*)    All other components within normal limits  I-STAT CHEM 8, ED - Abnormal; Notable for the following components:   Glucose, Bld 102 (*)    Calcium , Ion 1.14 (*)    All other components within normal limits  HCG, SERUM, QUALITATIVE  CBC    EKG: None  Radiology: US  PELVIC COMPLETE W TRANSVAGINAL AND TORSION R/O Result Date: 05/03/2024 CLINICAL DATA:  Pelvic pain. EXAM: TRANSABDOMINAL AND TRANSVAGINAL ULTRASOUND OF PELVIS DOPPLER ULTRASOUND OF OVARIES TECHNIQUE: Both transabdominal and transvaginal ultrasound examinations of the pelvis were performed. Transabdominal technique was performed for global imaging of the pelvis including uterus, ovaries, adnexal regions, and pelvic cul-de-sac. It was necessary to proceed with endovaginal exam following the transabdominal exam to visualize the right ovary. Color and duplex Doppler ultrasound was utilized to evaluate blood flow to the ovaries. COMPARISON:  Pelvic ultrasound 01/22/2018 and CT abdomen pelvis 05/03/2024 FINDINGS: Uterus Measurements: 8.9 x 2.9 x 5.2 cm = volume: 21.7 mL. There appears to be a small amount of fluid in the cervical canal. Probable nabothian cyst near the lower uterine segment. Small hypoechoic structure in the cervix measures up to 0.9 cm. This could represent small nabothian cyst versus small fibroid. Appearance of the cervix is similar to the exam from 2019. Endometrium Thickness: 0.6 cm.  Ill-defined. Right ovary Measurements: 3.4 x 2.4 x 3.3 cm = volume: 13.8 mL. Simple cyst in the right ovary measuring 2.8 x 2.2 x 2.7 cm. Left ovary Not visualized. Pulsed Doppler evaluation of the right ovary demonstrates normal low-resistance arterial and venous waveforms. Other findings No abnormal free fluid. IMPRESSION: 1. Right ovary is normal. No evidence for right ovarian torsion. 2. Left ovary is not visualized. 3. Small amount of fluid in the cervical canal. 4. Small hypoechoic structure in  the cervix could represent a small nabothian cyst versus small fibroid. Electronically Signed   By: Juliene Balder M.D.   On: 05/03/2024 11:04   CT ABDOMEN PELVIS W CONTRAST Result Date: 05/03/2024 CLINICAL DATA:  Abdominal/flank pain.  Stone suspected. EXAM: CT ABDOMEN AND PELVIS WITH CONTRAST TECHNIQUE: Multidetector CT imaging of the abdomen and pelvis was performed using the standard protocol following bolus administration of intravenous contrast. RADIATION DOSE REDUCTION: This exam was performed according to the departmental dose-optimization program which includes automated exposure control, adjustment of the mA and/or kV according to patient size and/or use of iterative reconstruction technique. CONTRAST:  75mL OMNIPAQUE  IOHEXOL  350 MG/ML SOLN COMPARISON:  09/08/2012 and pelvic ultrasound 01/22/2018 FINDINGS: Lower chest: Lung bases are clear. Hepatobiliary: Normal appearance of the liver, gallbladder and portal venous system. Pancreas: Unremarkable. No pancreatic ductal dilatation or surrounding inflammatory changes. Spleen: Normal in size without focal abnormality. Adrenals/Urinary Tract: Adrenal glands are unremarkable. Kidneys are normal, without renal calculi, focal lesion, or hydronephrosis. Bladder is unremarkable. Stomach/Bowel: Normal appearance of the stomach. No bowel dilatation or obstruction. No focal bowel inflammation. Appendectomy. Moderate stool burden in  the right colon. Vascular/Lymphatic: Aortic atherosclerosis. No enlarged abdominal or pelvic lymph nodes. Reproductive: 3.0 cm low-density structure in the right adnexa probably represents adnexal or ovarian cyst. Left adnexa is unremarkable. There may be a small amount of fluid in the lower uterine segment and cervix. Probable nabothian cysts. Other: Negative for free fluid. Negative for free air. Tiny umbilical hernia containing fat. Musculoskeletal: No acute bone abnormality. IMPRESSION: 1. No acute abnormality in the abdomen or pelvis. 2.  3.0 cm low-density structure in the right adnexa probably represents an adnexal or ovarian cyst. Small amount of fluid and probable nabothian cysts at the cervix. There were probably similar findings on the pelvic ultrasound from 2019. If this is area of clinical concern, consider further evaluation with pelvic ultrasound. 3. Aortic Atherosclerosis (ICD10-I70.0). Electronically Signed   By: Juliene Balder M.D.   On: 05/03/2024 09:38     Procedures   Medications Ordered in the ED  ketorolac  (TORADOL ) 15 MG/ML injection 15 mg (has no administration in time range)  ondansetron  (ZOFRAN -ODT) disintegrating tablet 4 mg (4 mg Oral Given 05/03/24 0726)  oxyCODONE -acetaminophen  (PERCOCET/ROXICET) 5-325 MG per tablet 1 tablet (1 tablet Oral Given 05/03/24 0725)  morphine  (PF) 4 MG/ML injection 4 mg (4 mg Intravenous Given 05/03/24 0853)  ondansetron  (ZOFRAN ) injection 4 mg (4 mg Intravenous Given 05/03/24 0852)  iohexol  (OMNIPAQUE ) 350 MG/ML injection 75 mL (75 mLs Intravenous Contrast Given 05/03/24 0916)                                    Medical Decision Making 48 year old female with past medical history of hypertension and ureteral stones in the past presenting to the emergency department today with right flank pain.  I will further evaluate the patient here with basic labs including LFTs and a lipase to evaluate for hepatobiliary pathology or pancreatitis.  I will give the patient morphine  and Zofran  for symptoms.  Will obtain a CT scan of her abdomen to evaluate for diverticulitis, colitis, ureterolithiasis, or other intra-abdominal pathology.  Will obtain a urinalysis to evaluate for pyelonephritis.  I will reevaluate for ultimate disposition.  This may be due to musculoskeletal pain but this would be a diagnosis of exclusion.  Based on description of her symptoms and reassuring neurovascular exam suspicion for aortic dissection or AAA is low at this time.  The patient's labs and CT scan are unremarkable.   Urinalysis is not consistent with infection.  CT scan did show some findings consistent with ovarian cyst.  Given this finding of the right sided pain ultrasound is ordered and does not show torsion.  The patient symptoms improved with medications here and she is discharged with return precautions.  Amount and/or Complexity of Data Reviewed Labs: ordered. Radiology: ordered.  Risk Prescription drug management.        Final diagnoses:  Flank pain    ED Discharge Orders          Ordered    ketorolac  (TORADOL ) 10 MG tablet  Every 6 hours PRN        05/03/24 1110               Ula Prentice SAUNDERS, MD 05/03/24 1112

## 2024-05-03 NOTE — ED Triage Notes (Signed)
 Pt states that she has had right flank pain x 1 week that began radiating to right lower abdomen x 1 day. Pt states that pain has been progressively worsening. Pt also reports difficulty emptying her bladder completely and pressure with urination.

## 2024-05-04 ENCOUNTER — Encounter: Payer: Self-pay | Admitting: Family Medicine

## 2024-05-19 ENCOUNTER — Encounter: Payer: Self-pay | Admitting: Physician Assistant

## 2024-05-19 ENCOUNTER — Ambulatory Visit: Admitting: Physician Assistant

## 2024-05-19 DIAGNOSIS — M79642 Pain in left hand: Secondary | ICD-10-CM

## 2024-05-19 NOTE — Progress Notes (Deleted)
 Office Visit Note   Patient: Sandra Briggs           Date of Birth: [birth year]           MRN: [id number] Visit Date: 05/19/2024              Requested by: Donah Laymon PARAS, MD [address] [place],  [place] [address] PCP: Donah Laymon PARAS, MD  No chief complaint on file.     HPI: ***  Assessment & Plan: Visit Diagnoses: No diagnosis found.  Plan: ***  Follow-Up Instructions: No follow-ups on file.   Ortho Exam  Patient is alert, oriented, no adenopathy, well-dressed, normal affect, normal respiratory effort. ***    Imaging: No results found. No images are attached to the encounter.  Labs: Lab Results  Component Value Date   ESRSEDRATE 9 11/25/2022   CRP 1 11/25/2022   CRP 0.9 08/14/2017   REPTSTATUS 06/19/2013 FINAL 06/17/2013   GRAMSTAIN NO WBC SEEN NO ORGANISMS SEEN CYTOSPIN 01/27/2010   CULT  06/17/2013    No Beta Hemolytic Streptococci Isolated Performed at [facility] Multiple bacterial morphotypes present, none 11/17/2013   LABORGA predominant. Suggest appropriate recollection if 11/17/2013   LABORGA clinically indicated. 11/17/2013     Lab Results  Component Value Date   ALBUMIN 4.6 07/10/2022   ALBUMIN 4.7 04/20/2020   ALBUMIN 4.6 08/14/2017    No results found for: MG No results found for: VD25OH  No results found for: PREALBUMIN    Latest Ref Rng & Units 05/03/2024    7:34 AM 05/03/2024    7:20 AM 07/10/2022    9:22 AM  CBC EXTENDED  WBC 4.0 - 10.5 K/uL  9.4  9.6   RBC 3.87 - 5.11 MIL/uL  4.42  4.52   Hemoglobin 12.0 - 15.0 g/dL 86.3  85.9  85.4   HCT 36.0 - 46.0 % 40.0  42.2  42.4   Platelets 150 - 400 K/uL  233  214      There is no height or weight on file to calculate BMI.  Orders:  No orders of the defined types were placed in this encounter.  No orders of the defined types were placed in this encounter.    Procedures: No procedures performed  Clinical Data: No  additional findings.  ROS:  All other systems negative, except as noted in the HPI. Review of Systems  Objective: Vital Signs: There were no vitals taken for this visit.  Specialty Comments:  No specialty comments available.  PMFS History: Patient Active Problem List   Diagnosis Date Noted   Routine adult health maintenance 07/23/2023   OSA (obstructive sleep apnea) 07/23/2023   Numbness and tingling in left hand 04/07/2023   Other fatigue 08/29/2022   Hypertension 08/03/2022   Syncope 07/10/2022   Dyspareunia in female 08/06/2021   Rash and nonspecific skin eruption 11/19/2017   Postcoital bleeding 08/18/2017   Hand pain 08/18/2017   Encounter for chronic pain management 09/04/2014   Shortness of breath 03/04/2014   Pseudogout 12/30/2013   Overweight (BMI 25.0-29.9) 02/27/2012   Cervicalgia 09/07/2010   Bipolar disorder (HCC) 07/02/2007   PANIC DISORDER 06/08/2007   Allergic rhinitis 10/16/2006   Past Medical History:  Diagnosis Date   Anxiety    Chronic pain    flank   Complication of anesthesia    DDD (degenerative disc disease), cervical    DDD (degenerative disc disease), lumbar    Dysuria-frequency syndrome  Exercise-induced asthma    Hypertension    Panic disorder    Pseudogout    Sprain of MCL (medial collateral ligament) of knee     Family History  Problem Relation Age of Onset   Lung cancer Mother    Allergic rhinitis Mother    Fibroids Sister    Allergic rhinitis Sister    Hyperlipidemia Maternal Grandmother    Allergic rhinitis Maternal Grandfather    Eczema Daughter    Food Allergy Son    Allergic rhinitis Maternal Aunt    Allergic rhinitis Maternal Uncle    Asthma Neg Hx    Urticaria Neg Hx    Immunodeficiency Neg Hx    Atopy Neg Hx    Angioedema Neg Hx    Colon cancer Neg Hx    Colon polyps Neg Hx    Esophageal cancer Neg Hx    Rectal cancer Neg Hx    Stomach cancer Neg Hx     Past Surgical History:  Procedure Laterality Date    APPENDECTOMY     CESAREAN SECTION     CRYOABLATION N/A    ENDOMETRIAL ABLATION  2009   novasure   TUBAL LIGATION  06/28/2005   WISDOM TOOTH EXTRACTION  2000   Social History   Occupational History   Occupation: Event organiser: [employer]  Tobacco Use   Smoking status: Former    Current packs/day: 0.00    Types: Cigarettes    Quit date: 02/27/2011    Years since quitting: 13.2    Passive exposure: Past   Smokeless tobacco: Never  Vaping Use   Vaping status: Never Used  Substance and Sexual Activity   Alcohol use: Yes    Comment: rare   Drug use: No   Sexual activity: Yes    Birth control/protection: Surgical

## 2024-05-20 NOTE — Progress Notes (Signed)
 Patient rescheduled. Wants to see hand specialist

## 2024-05-28 ENCOUNTER — Emergency Department (HOSPITAL_BASED_OUTPATIENT_CLINIC_OR_DEPARTMENT_OTHER)
Admission: EM | Admit: 2024-05-28 | Discharge: 2024-05-28 | Disposition: A | Discharge status: Home / Self Care | Attending: Emergency Medicine | Admitting: Emergency Medicine

## 2024-05-28 ENCOUNTER — Ambulatory Visit: Admitting: Student

## 2024-05-28 ENCOUNTER — Encounter: Payer: Self-pay | Admitting: Student

## 2024-05-28 ENCOUNTER — Other Ambulatory Visit: Payer: Self-pay

## 2024-05-28 ENCOUNTER — Ambulatory Visit
Admission: RE | Admit: 2024-05-28 | Discharge: 2024-05-28 | Disposition: A | Discharge status: Home / Self Care | Source: Ambulatory Visit

## 2024-05-28 ENCOUNTER — Encounter (HOSPITAL_BASED_OUTPATIENT_CLINIC_OR_DEPARTMENT_OTHER): Payer: Self-pay | Admitting: Emergency Medicine

## 2024-05-28 VITALS — BP 130/85 | HR 88 | Ht 64.0 in | Wt 187.0 lb

## 2024-05-28 DIAGNOSIS — Z23 Encounter for immunization: Secondary | ICD-10-CM | POA: Diagnosis not present

## 2024-05-28 DIAGNOSIS — I10 Essential (primary) hypertension: Secondary | ICD-10-CM | POA: Diagnosis not present

## 2024-05-28 DIAGNOSIS — M62830 Muscle spasm of back: Secondary | ICD-10-CM | POA: Diagnosis not present

## 2024-05-28 DIAGNOSIS — M545 Low back pain, unspecified: Secondary | ICD-10-CM

## 2024-05-28 DIAGNOSIS — M47816 Spondylosis without myelopathy or radiculopathy, lumbar region: Secondary | ICD-10-CM | POA: Diagnosis not present

## 2024-05-28 DIAGNOSIS — M549 Dorsalgia, unspecified: Secondary | ICD-10-CM | POA: Diagnosis present

## 2024-05-28 LAB — URINALYSIS, ROUTINE W REFLEX MICROSCOPIC
Bilirubin Urine: NEGATIVE
Glucose, UA: NEGATIVE mg/dL
Hgb urine dipstick: NEGATIVE
Ketones, ur: NEGATIVE mg/dL
Leukocytes,Ua: NEGATIVE
Nitrite: NEGATIVE
Protein, ur: NEGATIVE mg/dL
Specific Gravity, Urine: 1.02 (ref 1.005–1.030)
pH: 7 (ref 5.0–8.0)

## 2024-05-28 LAB — PREGNANCY, URINE: Preg Test, Ur: NEGATIVE

## 2024-05-28 MED ORDER — KETOROLAC TROMETHAMINE 30 MG/ML IJ SOLN
30.0000 mg | Freq: Once | INTRAMUSCULAR | Status: AC
Start: 2024-05-28 — End: 2024-05-28
  Administered 2024-05-28: 30 mg via INTRAMUSCULAR
  Filled 2024-05-28: qty 1

## 2024-05-28 MED ORDER — CYCLOBENZAPRINE HCL 5 MG PO TABS
5.0000 mg | ORAL_TABLET | Freq: Two times a day (BID) | ORAL | 0 refills | Status: DC | PRN
Start: 2024-05-28 — End: 2024-06-07

## 2024-05-28 NOTE — ED Triage Notes (Signed)
 Pt states was seen for back pain on the 9/15, pain has stayed constant, unable to lay down. Also presents with vaginal tingling.

## 2024-05-28 NOTE — Discharge Instructions (Signed)
 SEEK IMMEDIATE MEDICAL ATTENTION IF: New numbness, tingling, weakness, or problem with the use of your arms or legs.  Severe back pain not relieved with medications.  Change in bowel or bladder control (if you lose control of stool or urine, or if you are unable to urinate) Increasing pain in any areas of the body (such as chest or abdominal pain).  Shortness of breath, dizziness or fainting.  Nausea (feeling sick to your stomach), vomiting, fever, or sweats.

## 2024-05-28 NOTE — ED Provider Notes (Signed)
 [place] [facility] AT [facility] Provider Note   CSN: [id number] Arrival date & time: 05/28/24  9962     Patient presents with: Abdominal Pain   Sandra Briggs is a 48 y.o. female.   HPI Patient with history of hypertension, pseudogout, chronic pain presents for back pain.  Patient reports she was seen in the ER in mid September for flank and abdominal pain.  She had extensive workup at that time and was discharged.  She reports the pain has never really resolved.  She reports over the past several days the pain has worsened in the left flank and is worse with position.  No recent falls or trauma.  No previous back surgery.  She reports she now has tingling down into her left groin.  No leg weakness or numbness.  No urinary or fecal incontinence.  She is able to ambulate.  No other abdominal pain.  No vaginal bleeding or discharge.  No dysuria Patient takes daily pain medicines as prescribed at her PCP but is not helping Past Medical History:  Diagnosis Date   Anxiety    Chronic pain    flank   Complication of anesthesia    DDD (degenerative disc disease), cervical    DDD (degenerative disc disease), lumbar    Dysuria-frequency syndrome    Exercise-induced asthma    Hypertension    Panic disorder    Pseudogout    Sprain of MCL (medial collateral ligament) of knee     Prior to Admission medications   Medication Sig Start Date End Date Taking? Authorizing Provider  cyclobenzaprine  (FLEXERIL ) 5 MG tablet Take 1 tablet (5 mg total) by mouth 2 (two) times daily as needed for muscle spasms. 05/28/24  Yes Midge Golas, MD  atorvastatin  (LIPITOR) 40 MG tablet Take 1 tablet (40 mg total) by mouth daily. 04/02/24   Donah Laymon PARAS, MD  cetirizine  (ZYRTEC  ALLERGY) 10 MG tablet Take 1 tablet (10 mg total) by mouth 2 (two) times daily. 05/23/22   Romelle Booty, MD  erythromycin  ophthalmic ointment Place 1 Application into the right eye 3 (three) times daily.  04/01/24   Donah Laymon PARAS, MD  gabapentin  (NEURONTIN ) 300 MG capsule Take 1 capsule (300 mg total) by mouth 3 (three) times daily. 10/20/20   Simmons-Robinson, Makiera, MD  hydrochlorothiazide  (MICROZIDE ) 12.5 MG capsule Take 1 capsule (12.5 mg total) by mouth daily. 02/10/24   Donah Laymon PARAS, MD  HYDROcodone -acetaminophen  [facility]) 5-325 MG tablet Take 2 tablets by mouth 2 (two) times daily as needed for severe pain (pain score 7-10). 04/03/24   Donah Laymon PARAS, MD  HYDROcodone -acetaminophen  (NORCO) 5-325 MG tablet Take 2 tablets by mouth 2 (two) times daily as needed for severe pain (pain score 7-10). 04/03/24   Donah Laymon PARAS, MD  HYDROcodone -acetaminophen  [facility]) 5-325 MG tablet Take 2 tablets by mouth 2 (two) times daily as needed for severe pain (pain score 7-10). 04/03/24   Donah Laymon PARAS, MD  ketorolac  (TORADOL ) 10 MG tablet Take 1 tablet (10 mg total) by mouth every 6 (six) hours as needed. 05/03/24   Ula Prentice SAUNDERS, MD  montelukast  (SINGULAIR ) 10 MG tablet TAKE 1 TABLET BY MOUTH EVERYDAY AT BEDTIME 05/03/22   Donah Laymon PARAS, MD    Allergies: Bee venom, Sulfonamide derivatives, and Tramadol hcl    Review of Systems  Gastrointestinal:  Negative for vomiting.  Genitourinary:  Negative for difficulty urinating, dysuria, pelvic pain, vaginal bleeding and vaginal discharge.  Musculoskeletal:  Positive for  back pain.  Neurological:  Negative for weakness.    Updated Vital Signs BP (!) 175/94 (BP Location: Right Arm)   Pulse 94   Temp 98.4 F (36.9 C) (Oral)   Resp 20   Ht 1.626 m (5' 4)   Wt 81.6 kg   SpO2 97%   BMI 30.90 kg/m   Physical Exam CONSTITUTIONAL: Well developed/well nourished, anxious anterior HEAD: Normocephalic/atraumatic EYES: EOMI/PERRL ENMT: Mucous membranes moist NECK: supple no meningeal signs SPINE/BACK:entire spine nontender lumbar paraspinal tenderness No bruising/crepitance/stepoffs noted to spine CV: S1/S2 noted, no  murmurs/rubs/gallops noted LUNGS: Lungs are clear to auscultation bilaterally, no apparent distress ABDOMEN: soft, nontender, no rebound or guarding GU:no cva tenderness NEURO: Awake/alert, equal motor 5/5 strength noted with the following: hip flexion/knee flexion/extension, foot dorsi/plantar flexion, great toe extension intact bilaterally, no clonus bilaterally, plantar reflex appropriate (toes downgoing), no sensory deficit in any dermatome. Pt is able to ambulate unassisted. EXTREMITIES: pulses normal and equal in her feet, full ROM, no deformity SKIN: warm, color normal PSYCH: Anxious (all labs ordered are listed, but only abnormal results are displayed) Labs Reviewed  URINALYSIS, ROUTINE W REFLEX MICROSCOPIC  PREGNANCY, URINE    EKG: None  Radiology: No results found.   Procedures   Medications Ordered in the ED  ketorolac  (TORADOL ) 30 MG/ML injection 30 mg (30 mg Intramuscular Given 05/28/24 0146)    Clinical Course as of 05/28/24 0247  Fri May 28, 2024  0205 Patient presents for continued back pain now reporting numbness into her left inguinal region but not into her legs.  She does not have any actual vaginal complaints.  She has no focal abdominal tender   She has no neurodeficits.  This appears to be musculoskeletal in nature, though may have radiculopathy.  Patient had extensive workup last month in the ER including CT abdomen pelvis and pelvic ultrasound.  Will defer imaging for now. [DW]  9753 Patient with minimal relief from Toradol .  However she is able to ambulate.  Reports its actually improved when she stands up and worsen when she lays flat.  There appears to be a strong muscle spasm component.  Patient safe for discharge.  Will add on a short course of muscle relaxant, but advised not to take this while taking her other pain medicine Message has been sent to her PCP to ensure close follow-up but she would likely need to have MRI.  However no indication for  emergent neuroimaging given lack of neurodeficits and no red flags  We discussed strict turn precaution [DW]    Clinical Course User Index [DW] Midge Golas, MD                                 Medical Decision Making Amount and/or Complexity of Data Reviewed Labs: ordered.  Risk Prescription drug management.   This patient presents to the ED for concern of back pain, this involves an extensive number of treatment options, and is a complaint that carries with it a high risk of complications and morbidity.  The differential diagnosis includes but is not limited to muscle strain, lumbosacral radiculopathy, epidural abscess, discitis, pyelonephritis, urinary tract infection, ovarian torsion, tubo-ovarian abscess, diverticulitis  Comorbidities that complicate the patient evaluation: Patient's presentation is complicated by their history of hypertension  Social Determinants of Health: Patient's history of chronic pain  increases the complexity of managing their presentation  Additional history obtained: Records reviewed Primary Care Documents  Lab Tests: I Ordered, and personally interpreted labs.  The pertinent results include: Urinalysis is negative  Medicines ordered and prescription drug management: I ordered medication including Toradol  for pain Reevaluation of the patient after these medicines showed that the patient    stayed the same  Test Considered: MRI lumbar spine considered but since there are no neuro deficits will defer for now   Reevaluation: After the interventions noted above, I reevaluated the patient and found that they have :stayed the same  Complexity of problems addressed: Patient's presentation is most consistent with  acute, uncomplicated illness  Disposition: After consideration of the diagnostic results and the patient's response to treatment,  I feel that the patent would benefit from discharge  .        Final diagnoses:  Muscle spasm of  back    ED Discharge Orders          Ordered    cyclobenzaprine  (FLEXERIL ) 5 MG tablet  2 times daily PRN        05/28/24 0233               Midge Golas, MD 05/28/24 [id number]

## 2024-05-28 NOTE — Progress Notes (Signed)
    SUBJECTIVE:   CHIEF COMPLAINT / HPI:   48 year old female with Presenting today for ED follow-up Was seen at the ED today for left-sided lower back pain. Patient states she first had lower back pain about 3 weeks ago Went to the ED and was diagnosed with muscle strain. In the ED she received ketorolac .  Pregnancy test and UA were negative However in the last 4 days have has significant pain affecting her ambulation Medication include Norco which patient said provided mild relief. Denies any recent trauma or fall.  Back pain is worse with laying flat on the back In the past 4 days patient have had to lay down in a recliner at an angle for pain relieve No lower extremity paresthesia, saddle paresthesia or urinary/bowel incontinence.   PERTINENT  PMH / PSH: Reviewed   OBJECTIVE:   BP 130/85   Pulse 88   Ht 5' 4 (1.626 m)   Wt 187 lb (84.8 kg)   SpO2 96%   BMI 32.10 kg/m    Physical Exam  General: Alert, well appearing, NAD   Back No gross deformity, scoliosis. Moderate TTP currently of the Left lowerback. Worsening pain laying flat on the back. Negative  Strength LEs 5/5 all muscle groups.   Negative SLRs but endorses pain with left leg raise. Sensation intact to light touch bilaterally.  ASSESSMENT/PLAN:   Left lower back pain Unclear cause of patient's back pain at this time but concerning for musculoskeletal versus lumbar radiculopathy.  Will obtain imaging for further assessment.  No red flag symptoms, denies incontinence or saddle paresthesia. Exam notable for area of tenderness at left lower back. She has 5/5 muscle strength on all extremities with sensation intact.  -Encourage patient to continue her Norco as prescribed - Recommend ibuprofen  800 mg every 8 hours as needed and can interchange with Tylenol  - Encouraged to continue Flexeril  ordered in the ED - Ordered lumbar spine x-ray -Strict ED precautions discussed with the patient  Norleen April,  MD [facility]

## 2024-05-28 NOTE — Patient Instructions (Signed)
 Pleasure to meet you today.  I have ordered x-ray of your spine to figure out why you are having this lower back pain.  Continue to take your Norco as scheduled.  You can also do ibuprofen  800 mg every 8 hours for pain relief.  And you can interchange that with Tylenol .  Take the Flexeril  which is a muscle relaxer once you have gotten home.  If your pain is getting any worse you start having any fevers, any bowel or urinary incontinence or weakness in your legs please go to the ED to be evaluated.

## 2024-05-31 ENCOUNTER — Other Ambulatory Visit (INDEPENDENT_AMBULATORY_CARE_PROVIDER_SITE_OTHER): Payer: Self-pay

## 2024-05-31 ENCOUNTER — Ambulatory Visit (INDEPENDENT_AMBULATORY_CARE_PROVIDER_SITE_OTHER): Admitting: Orthopedic Surgery

## 2024-05-31 DIAGNOSIS — M151 Heberden's nodes (with arthropathy): Secondary | ICD-10-CM

## 2024-05-31 DIAGNOSIS — M79641 Pain in right hand: Secondary | ICD-10-CM

## 2024-05-31 DIAGNOSIS — M79642 Pain in left hand: Secondary | ICD-10-CM

## 2024-05-31 NOTE — H&P (View-Only) (Signed)
 Sandra Briggs - 48 y.o. female MRN [id number]  Date of birth: June 15, 1976  Office Visit Note: Visit Date: 05/31/2024 PCP: Donah Laymon PARAS, MD Referred by: Donah Laymon PARAS, MD  Subjective: No chief complaint on file.  HPI: Sandra Briggs is a pleasant 48 y.o. female who presents today for evaluation of bilateral hand complaints, most notable at the DIP regions of the small fingers bilaterally.  She has been seen in the past by rheumatology, does have history of prior pseudogout.  Pertinent ROS were reviewed with the patient and found to be negative unless otherwise specified above in HPI.   Visit Reason: bilateral hands Duration of symptoms: 1 year Hand dominance: left Occupation: Retail Management Diabetic: No Smoking: Yes Heart/Lung History: Hypertension Blood Thinners: none  Prior Testing/EMG: none Injections (Date): none Treatments: none Prior Surgery: none    Assessment & Plan: Visit Diagnoses:  1. 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 Bilateral hand pain     Plan: Extensive discussion was had the patient today regarding her bilateral hand complaints.  From a clinical and radiographic standpoint, she has arthritic change most notable at the DIP joints of the bilateral small fingers with specific joint space narrowing and osteophyte formation.  This does correlate with her ongoing clinical examination.  We discussed extensively conservative measures including Coban wrapping, splinting, activity modification, nonsteroidal anti-inflammatory both topical and oral as well as bracing.  From a surgical standpoint, we discussed DIP joint arthrodesis as well as all risks and benefits associated.  Understanding her options, she would like to move forward with right small finger DIP joint arthrodesis at the next available date.  Risks and benefits of the procedure were discussed, risks including but not limited to  infection, bleeding, scarring, stiffness, nerve injury, tendon injury, vascular injury, hardware complication, recurrence of symptoms and need for subsequent operation.  We also discussed the appropriate postoperative protocol and timeframe for return to activities and function.  Patient expressed understanding.    Follow-up: No follow-ups on file.   Meds & Orders: No orders of the defined types were placed in this encounter.   Orders Placed This Encounter  Procedures   XR Hand Complete Right   XR Hand Complete Left     Procedures: No procedures performed      Clinical History: No specialty comments available.  She reports that she quit smoking about 13 years ago. Her smoking use included cigarettes. She has been exposed to tobacco smoke. She has never used smokeless tobacco. No results for input(s): HGBA1C, LABURIC in the last 8760 hours.  Objective:   Vital Signs: There were no vitals taken for this visit.  Physical Exam  Gen: Well-appearing, in no acute distress; non-toxic CV: Regular Rate. Well-perfused. Warm.  Resp: Breathing unlabored on room air; no wheezing. Psych: Fluid speech in conversation; appropriate affect; normal thought process  Ortho Exam Right hand: - Significant Heberden nodes appreciated diffusely throughout the DIP, associated tenderness most pronounced at the small finger DIP region - Small finger DIP range of motion limited 0-10 active and passive  Left hand: - Significant Heberden nodes appreciated diffusely throughout the DIP, associated tenderness most pronounced at the small finger DIP region - Small finger DIP range of motion limited 0-15 active and passive    Imaging: XR Hand Complete Left Result Date: 05/31/2024 X-rays of the right hand multiple views were obtained today X-rays demonstrate diffuse degenerative changes seen most notably at the  DIP regions, small finger DIP with significant joint space narrowing and osteophyte  formation.  XR Hand Complete Right Result Date: 05/31/2024 X-rays of the right hand multiple views were obtained today X-rays demonstrate diffuse degenerative changes seen most notably at the DIP regions, small finger DIP with significant joint space narrowing and osteophyte formation.   Past Medical/Family/Surgical/Social History: Medications & Allergies reviewed per EMR, new medications updated. Patient Active Problem List   Diagnosis Date Noted   Routine adult health maintenance 07/23/2023   OSA (obstructive sleep apnea) 07/23/2023   Numbness and tingling in left hand 04/07/2023   Other fatigue 08/29/2022   Hypertension 08/03/2022   Syncope 07/10/2022   Dyspareunia in female 08/06/2021   Rash and nonspecific skin eruption 11/19/2017   Postcoital bleeding 08/18/2017   Hand pain 08/18/2017   Encounter for chronic pain management 09/04/2014   Shortness of breath 03/04/2014   Pseudogout 12/30/2013   Overweight (BMI 25.0-29.9) 02/27/2012   Cervicalgia 09/07/2010   Bipolar disorder (HCC) 07/02/2007   PANIC DISORDER 06/08/2007   Allergic rhinitis 10/16/2006   Past Medical History:  Diagnosis Date   Anxiety    Chronic pain    flank   Complication of anesthesia    DDD (degenerative disc disease), cervical    DDD (degenerative disc disease), lumbar    Dysuria-frequency syndrome    Exercise-induced asthma    Hypertension    Panic disorder    Pseudogout    Sprain of MCL (medial collateral ligament) of knee    Family History  Problem Relation Age of Onset   Lung cancer Mother    Allergic rhinitis Mother    Fibroids Sister    Allergic rhinitis Sister    Hyperlipidemia Maternal Grandmother    Allergic rhinitis Maternal Grandfather    Eczema Daughter    Food Allergy Son    Allergic rhinitis Maternal Aunt    Allergic rhinitis Maternal Uncle    Asthma Neg Hx    Urticaria Neg Hx    Immunodeficiency Neg Hx    Atopy Neg Hx    Angioedema Neg Hx    Colon cancer Neg Hx     Colon polyps Neg Hx    Esophageal cancer Neg Hx    Rectal cancer Neg Hx    Stomach cancer Neg Hx    Past Surgical History:  Procedure Laterality Date   APPENDECTOMY     CESAREAN SECTION     CRYOABLATION N/A    ENDOMETRIAL ABLATION  2009   novasure   TUBAL LIGATION  06/28/2005   WISDOM TOOTH EXTRACTION  2000   Social History   Occupational History   Occupation: Event organiser: [employer]  Tobacco Use   Smoking status: Former    Current packs/day: 0.00    Types: Cigarettes    Quit date: 02/27/2011    Years since quitting: 13.2    Passive exposure: Past   Smokeless tobacco: Never  Vaping Use   Vaping status: Never Used  Substance and Sexual Activity   Alcohol use: Yes    Comment: rare   Drug use: No   Sexual activity: Yes    Birth control/protection: Surgical    Torrell Krutz Estela) Arlinda, M.D. [facility], Hand Surgery

## 2024-05-31 NOTE — Progress Notes (Signed)
 Sandra Briggs - 48 y.o. female MRN [id number]  Date of birth: June 15, 1976  Office Visit Note: Visit Date: 05/31/2024 PCP: Donah Laymon PARAS, MD Referred by: Donah Laymon PARAS, MD  Subjective: No chief complaint on file.  HPI: Sandra Briggs is a pleasant 48 y.o. female who presents today for evaluation of bilateral hand complaints, most notable at the DIP regions of the small fingers bilaterally.  She has been seen in the past by rheumatology, does have history of prior pseudogout.  Pertinent ROS were reviewed with the patient and found to be negative unless otherwise specified above in HPI.   Visit Reason: bilateral hands Duration of symptoms: 1 year Hand dominance: left Occupation: Retail Management Diabetic: No Smoking: Yes Heart/Lung History: Hypertension Blood Thinners: none  Prior Testing/EMG: none Injections (Date): none Treatments: none Prior Surgery: none    Assessment & Plan: Visit Diagnoses:  1. 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 Bilateral hand pain     Plan: Extensive discussion was had the patient today regarding her bilateral hand complaints.  From a clinical and radiographic standpoint, she has arthritic change most notable at the DIP joints of the bilateral small fingers with specific joint space narrowing and osteophyte formation.  This does correlate with her ongoing clinical examination.  We discussed extensively conservative measures including Coban wrapping, splinting, activity modification, nonsteroidal anti-inflammatory both topical and oral as well as bracing.  From a surgical standpoint, we discussed DIP joint arthrodesis as well as all risks and benefits associated.  Understanding her options, she would like to move forward with right small finger DIP joint arthrodesis at the next available date.  Risks and benefits of the procedure were discussed, risks including but not limited to  infection, bleeding, scarring, stiffness, nerve injury, tendon injury, vascular injury, hardware complication, recurrence of symptoms and need for subsequent operation.  We also discussed the appropriate postoperative protocol and timeframe for return to activities and function.  Patient expressed understanding.    Follow-up: No follow-ups on file.   Meds & Orders: No orders of the defined types were placed in this encounter.   Orders Placed This Encounter  Procedures   XR Hand Complete Right   XR Hand Complete Left     Procedures: No procedures performed      Clinical History: No specialty comments available.  She reports that she quit smoking about 13 years ago. Her smoking use included cigarettes. She has been exposed to tobacco smoke. She has never used smokeless tobacco. No results for input(s): HGBA1C, LABURIC in the last 8760 hours.  Objective:   Vital Signs: There were no vitals taken for this visit.  Physical Exam  Gen: Well-appearing, in no acute distress; non-toxic CV: Regular Rate. Well-perfused. Warm.  Resp: Breathing unlabored on room air; no wheezing. Psych: Fluid speech in conversation; appropriate affect; normal thought process  Ortho Exam Right hand: - Significant Heberden nodes appreciated diffusely throughout the DIP, associated tenderness most pronounced at the small finger DIP region - Small finger DIP range of motion limited 0-10 active and passive  Left hand: - Significant Heberden nodes appreciated diffusely throughout the DIP, associated tenderness most pronounced at the small finger DIP region - Small finger DIP range of motion limited 0-15 active and passive    Imaging: XR Hand Complete Left Result Date: 05/31/2024 X-rays of the right hand multiple views were obtained today X-rays demonstrate diffuse degenerative changes seen most notably at the  DIP regions, small finger DIP with significant joint space narrowing and osteophyte  formation.  XR Hand Complete Right Result Date: 05/31/2024 X-rays of the right hand multiple views were obtained today X-rays demonstrate diffuse degenerative changes seen most notably at the DIP regions, small finger DIP with significant joint space narrowing and osteophyte formation.   Past Medical/Family/Surgical/Social History: Medications & Allergies reviewed per EMR, new medications updated. Patient Active Problem List   Diagnosis Date Noted   Routine adult health maintenance 07/23/2023   OSA (obstructive sleep apnea) 07/23/2023   Numbness and tingling in left hand 04/07/2023   Other fatigue 08/29/2022   Hypertension 08/03/2022   Syncope 07/10/2022   Dyspareunia in female 08/06/2021   Rash and nonspecific skin eruption 11/19/2017   Postcoital bleeding 08/18/2017   Hand pain 08/18/2017   Encounter for chronic pain management 09/04/2014   Shortness of breath 03/04/2014   Pseudogout 12/30/2013   Overweight (BMI 25.0-29.9) 02/27/2012   Cervicalgia 09/07/2010   Bipolar disorder (HCC) 07/02/2007   PANIC DISORDER 06/08/2007   Allergic rhinitis 10/16/2006   Past Medical History:  Diagnosis Date   Anxiety    Chronic pain    flank   Complication of anesthesia    DDD (degenerative disc disease), cervical    DDD (degenerative disc disease), lumbar    Dysuria-frequency syndrome    Exercise-induced asthma    Hypertension    Panic disorder    Pseudogout    Sprain of MCL (medial collateral ligament) of knee    Family History  Problem Relation Age of Onset   Lung cancer Mother    Allergic rhinitis Mother    Fibroids Sister    Allergic rhinitis Sister    Hyperlipidemia Maternal Grandmother    Allergic rhinitis Maternal Grandfather    Eczema Daughter    Food Allergy Son    Allergic rhinitis Maternal Aunt    Allergic rhinitis Maternal Uncle    Asthma Neg Hx    Urticaria Neg Hx    Immunodeficiency Neg Hx    Atopy Neg Hx    Angioedema Neg Hx    Colon cancer Neg Hx     Colon polyps Neg Hx    Esophageal cancer Neg Hx    Rectal cancer Neg Hx    Stomach cancer Neg Hx    Past Surgical History:  Procedure Laterality Date   APPENDECTOMY     CESAREAN SECTION     CRYOABLATION N/A    ENDOMETRIAL ABLATION  2009   novasure   TUBAL LIGATION  06/28/2005   WISDOM TOOTH EXTRACTION  2000   Social History   Occupational History   Occupation: Event organiser: [employer]  Tobacco Use   Smoking status: Former    Current packs/day: 0.00    Types: Cigarettes    Quit date: 02/27/2011    Years since quitting: 13.2    Passive exposure: Past   Smokeless tobacco: Never  Vaping Use   Vaping status: Never Used  Substance and Sexual Activity   Alcohol use: Yes    Comment: rare   Drug use: No   Sexual activity: Yes    Birth control/protection: Surgical    Torrell Krutz Estela) Arlinda, M.D. [facility], [facility] Surgery

## 2024-06-01 ENCOUNTER — Ambulatory Visit: Payer: Self-pay | Admitting: Student

## 2024-06-02 ENCOUNTER — Ambulatory Visit (INDEPENDENT_AMBULATORY_CARE_PROVIDER_SITE_OTHER)

## 2024-06-02 VITALS — BP 172/92 | HR 96 | Ht 65.0 in | Wt 184.4 lb

## 2024-06-02 DIAGNOSIS — R112 Nausea with vomiting, unspecified: Secondary | ICD-10-CM | POA: Diagnosis not present

## 2024-06-02 DIAGNOSIS — G8929 Other chronic pain: Secondary | ICD-10-CM | POA: Diagnosis not present

## 2024-06-02 DIAGNOSIS — M545 Low back pain, unspecified: Secondary | ICD-10-CM

## 2024-06-02 NOTE — Progress Notes (Signed)
    SUBJECTIVE:   CHIEF COMPLAINT / HPI:   Back Pain - Has been seen in ED twice for back pain (9/15 and 10/10) as well as our office once (10/10). CT abd/pelvis had ovarian cyst, pelvic US  unremarkable. BMP and CBC from 9/15 unremarkable. UA and UPT negative from 10/10.  - Has been using toradol  without improvement. No improvement with flexeril .  - Has been vomiting intermittently but usually when her back is hurting the worst. Last night she had 4-5 episodes of vomiting.  - Continues to drink water, taking small sips. Has not taken any zofran  or phenergan but has it at home   - On hydrocodone  10mg  BID, last filled on 9/23. Continues to take as normal.  - Reports trouble going to work due to pain - No numbness currently, loss of bowel or bladder control, saddle anesthesia, fevers  PERTINENT  PMH / PSH: HTN, bipolar, chronic pain  OBJECTIVE:   BP (!) 172/92   Pulse 96   Ht 5' 5 (1.651 m)   Wt 184 lb 6.4 oz (83.6 kg)   SpO2 99%   BMI 30.69 kg/m   Physical Exam General: Alert, conversant, cooperative. No acute distress.  HEENT: PERRL. EOMI. MMM.  Cardiovascular: RRR Respiratory: Lungs CTAB. Normal work of breathing. Abdomen: Non distended Extremities: No cyanosis. No edema Musculoskeletal: Marked tenderness over L>R paraspinal muscles of lumbar spine, as well as SI joint. LE strength 4/5 on the left and 5/5 on the right. Sensation intact. Neg SLR but did cause pain.  Skin: Warm. Dry. No rashes. No icterus.  Neurologic: No focal deficits. Moving all extremities. Psychiatric: Cooperative. Appropriate mood. Appropriate affect.   ASSESSMENT/PLAN:   Assessment & Plan Nausea and vomiting, unspecified vomiting type May be 2/2 pain. Repeat labs to monitor renal function. Also checking for alternative causes of back pain with vomiting such as pancreatitis or other GI pathology Chronic bilateral low back pain without sciatica Workup thus far has been negative. Given her severe  symptoms for over 2 months without improvement with conservative treatments of NSAIDs, will proceed with MRI. Pt had unremarkable CT and xrays. She has failed NSAIDS and home opioids and gapabentin. She is also open to PT referral.      Milda LITTIE Deed, MD [facility]

## 2024-06-02 NOTE — Patient Instructions (Signed)
 It was good to see you today.   Please bring ALL of your medications with you to every visit.    Today we talked about: Back Pain. MRI pending. Labs ordered    Thank you for choosing [facility]. Please refer to your mychart for specifics regarding today's visit or future appointments.

## 2024-06-03 ENCOUNTER — Ambulatory Visit: Payer: Self-pay | Admitting: Family Medicine

## 2024-06-03 LAB — LIPASE: Lipase: 17 U/L (ref 14–72)

## 2024-06-03 LAB — COMPREHENSIVE METABOLIC PANEL WITH GFR
ALT: 16 IU/L (ref 0–32)
AST: 19 IU/L (ref 0–40)
Albumin: 4.6 g/dL (ref 3.9–4.9)
Alkaline Phosphatase: 78 IU/L (ref 41–116)
BUN/Creatinine Ratio: 15 (ref 9–23)
BUN: 11 mg/dL (ref 6–24)
Bilirubin Total: 0.4 mg/dL (ref 0.0–1.2)
CO2: 22 mmol/L (ref 20–29)
Calcium: 9.2 mg/dL (ref 8.7–10.2)
Chloride: 101 mmol/L (ref 96–106)
Creatinine, Ser: 0.73 mg/dL (ref 0.57–1.00)
Globulin, Total: 2.7 g/dL (ref 1.5–4.5)
Glucose: 100 mg/dL — ABNORMAL HIGH (ref 70–99)
Potassium: 4.4 mmol/L (ref 3.5–5.2)
Sodium: 135 mmol/L (ref 134–144)
Total Protein: 7.3 g/dL (ref 6.0–8.5)
eGFR: 102 mL/min/1.73 (ref >59)

## 2024-06-07 ENCOUNTER — Encounter (HOSPITAL_BASED_OUTPATIENT_CLINIC_OR_DEPARTMENT_OTHER): Payer: Self-pay | Admitting: Orthopedic Surgery

## 2024-06-07 ENCOUNTER — Other Ambulatory Visit: Payer: Self-pay

## 2024-06-07 NOTE — Progress Notes (Signed)
   06/07/24 1357  PAT Phone Screen  Is the patient taking a GLP-1 receptor agonist? No  Do You Have Diabetes? No  Do You Have Hypertension? Yes  Have You Ever Been to the ER for Asthma? No  Have You Taken Oral Steroids in the Past 3 Months? No  Do you Take Phenteramine or any Other Diet Drugs? No  Recent  Lab Work, EKG, CXR? Yes  Where was this test performed? [facility] 06/02/24  Do you have a history of heart problems? No  Any Recent Hospitalizations? No  Height 5' 5 (1.651 m)  Weight 83.6 kg  Pat Appointment Scheduled Yes (ekg)   Pt reports that her grandfather has a hx of an allergy to propofol (not succinylcholine), when asked about MH she stated that that may also be the condition. She herself has had several surgeries and has not had an issue with anesthesia. Unable to find any anesthesia records for pt as her other surgeries were in 2006, 2009. Time of surgery and family hx reviewed with Dr Cleotilde- ok to proceed with surgery as planned, no anesthesia consult needed.

## 2024-06-08 ENCOUNTER — Other Ambulatory Visit: Payer: Self-pay

## 2024-06-08 DIAGNOSIS — M151 Heberden's nodes (with arthropathy): Secondary | ICD-10-CM

## 2024-06-10 ENCOUNTER — Ambulatory Visit (HOSPITAL_COMMUNITY)
Admission: RE | Admit: 2024-06-10 | Discharge: 2024-06-10 | Disposition: A | Discharge status: Home / Self Care | Source: Ambulatory Visit

## 2024-06-10 ENCOUNTER — Encounter (HOSPITAL_BASED_OUTPATIENT_CLINIC_OR_DEPARTMENT_OTHER)
Admission: RE | Admit: 2024-06-10 | Discharge: 2024-06-10 | Disposition: A | Discharge status: Home / Self Care | Source: Ambulatory Visit | Attending: Orthopedic Surgery | Admitting: Orthopedic Surgery

## 2024-06-10 DIAGNOSIS — Z01818 Encounter for other preprocedural examination: Secondary | ICD-10-CM | POA: Insufficient documentation

## 2024-06-10 DIAGNOSIS — M48061 Spinal stenosis, lumbar region without neurogenic claudication: Secondary | ICD-10-CM | POA: Diagnosis not present

## 2024-06-10 DIAGNOSIS — M47816 Spondylosis without myelopathy or radiculopathy, lumbar region: Secondary | ICD-10-CM | POA: Diagnosis not present

## 2024-06-10 DIAGNOSIS — J45909 Unspecified asthma, uncomplicated: Secondary | ICD-10-CM | POA: Diagnosis not present

## 2024-06-10 DIAGNOSIS — I10 Essential (primary) hypertension: Secondary | ICD-10-CM | POA: Diagnosis not present

## 2024-06-10 DIAGNOSIS — Z87891 Personal history of nicotine dependence: Secondary | ICD-10-CM | POA: Diagnosis not present

## 2024-06-10 DIAGNOSIS — M151 Heberden's nodes (with arthropathy): Secondary | ICD-10-CM | POA: Diagnosis not present

## 2024-06-10 DIAGNOSIS — G4733 Obstructive sleep apnea (adult) (pediatric): Secondary | ICD-10-CM | POA: Diagnosis not present

## 2024-06-10 DIAGNOSIS — G8929 Other chronic pain: Secondary | ICD-10-CM | POA: Insufficient documentation

## 2024-06-10 DIAGNOSIS — M19041 Primary osteoarthritis, right hand: Secondary | ICD-10-CM | POA: Diagnosis not present

## 2024-06-10 DIAGNOSIS — M545 Low back pain, unspecified: Secondary | ICD-10-CM | POA: Diagnosis not present

## 2024-06-10 DIAGNOSIS — M549 Dorsalgia, unspecified: Secondary | ICD-10-CM | POA: Diagnosis not present

## 2024-06-11 ENCOUNTER — Ambulatory Visit (HOSPITAL_BASED_OUTPATIENT_CLINIC_OR_DEPARTMENT_OTHER)
Admission: RE | Admit: 2024-06-11 | Discharge: 2024-06-11 | Disposition: A | Discharge status: Home / Self Care | Attending: Orthopedic Surgery | Admitting: Orthopedic Surgery

## 2024-06-11 ENCOUNTER — Other Ambulatory Visit: Payer: Self-pay

## 2024-06-11 ENCOUNTER — Ambulatory Visit (HOSPITAL_BASED_OUTPATIENT_CLINIC_OR_DEPARTMENT_OTHER): Admitting: Anesthesiology

## 2024-06-11 ENCOUNTER — Encounter (HOSPITAL_BASED_OUTPATIENT_CLINIC_OR_DEPARTMENT_OTHER)
Admission: RE | Disposition: A | Discharge status: Home / Self Care | Payer: Self-pay | Source: Home / Self Care | Attending: Orthopedic Surgery

## 2024-06-11 ENCOUNTER — Encounter (HOSPITAL_BASED_OUTPATIENT_CLINIC_OR_DEPARTMENT_OTHER): Payer: Self-pay | Admitting: Orthopedic Surgery

## 2024-06-11 ENCOUNTER — Ambulatory Visit (HOSPITAL_BASED_OUTPATIENT_CLINIC_OR_DEPARTMENT_OTHER)

## 2024-06-11 ENCOUNTER — Other Ambulatory Visit: Payer: Self-pay | Admitting: Orthopedic Surgery

## 2024-06-11 DIAGNOSIS — Z87891 Personal history of nicotine dependence: Secondary | ICD-10-CM | POA: Insufficient documentation

## 2024-06-11 DIAGNOSIS — M19041 Primary osteoarthritis, right hand: Secondary | ICD-10-CM | POA: Insufficient documentation

## 2024-06-11 DIAGNOSIS — I10 Essential (primary) hypertension: Secondary | ICD-10-CM | POA: Diagnosis not present

## 2024-06-11 DIAGNOSIS — M151 Heberden's nodes (with arthropathy): Secondary | ICD-10-CM | POA: Diagnosis not present

## 2024-06-11 DIAGNOSIS — G4733 Obstructive sleep apnea (adult) (pediatric): Secondary | ICD-10-CM | POA: Insufficient documentation

## 2024-06-11 DIAGNOSIS — J45909 Unspecified asthma, uncomplicated: Secondary | ICD-10-CM | POA: Diagnosis not present

## 2024-06-11 DIAGNOSIS — Z01818 Encounter for other preprocedural examination: Secondary | ICD-10-CM

## 2024-06-11 HISTORY — PX: DISTAL INTERPHALANGEAL JOINT FUSION: SHX6428

## 2024-06-11 HISTORY — DX: Sleep apnea, unspecified: G47.30

## 2024-06-11 HISTORY — DX: Family history of other specified conditions: Z84.89

## 2024-06-11 LAB — POCT PREGNANCY, URINE: Preg Test, Ur: NEGATIVE

## 2024-06-11 SURGERY — DISTAL INTERPHALANGEAL JOINT FUSION
Anesthesia: General | Site: Finger | Laterality: Right

## 2024-06-11 MED ORDER — AMISULPRIDE (ANTIEMETIC) 5 MG/2ML IV SOLN
INTRAVENOUS | Status: AC
  Filled 2024-06-11: qty 4

## 2024-06-11 MED ORDER — CEFAZOLIN SODIUM-DEXTROSE 2-4 GM/100ML-% IV SOLN
INTRAVENOUS | Status: AC
  Filled 2024-06-11: qty 100

## 2024-06-11 MED ORDER — OXYCODONE HCL 5 MG/5ML PO SOLN: 5.0000 mg | Freq: Once | ORAL | Status: AC | PRN

## 2024-06-11 MED ORDER — SUCCINYLCHOLINE CHLORIDE 200 MG/10ML IV SOSY
PREFILLED_SYRINGE | INTRAVENOUS | Status: AC
  Filled 2024-06-11: qty 10

## 2024-06-11 MED ORDER — MIDAZOLAM HCL 5 MG/5ML IJ SOLN
INTRAMUSCULAR | Status: DC | PRN
  Administered 2024-06-11: 2 mg via INTRAVENOUS

## 2024-06-11 MED ORDER — ATROPINE SULFATE 0.4 MG/ML IV SOLN
INTRAVENOUS | Status: AC
  Filled 2024-06-11: qty 1

## 2024-06-11 MED ORDER — KETOROLAC TROMETHAMINE 30 MG/ML IJ SOLN: 30.0000 mg | Freq: Once | INTRAMUSCULAR | Status: DC | PRN

## 2024-06-11 MED ORDER — FENTANYL CITRATE (PF) 100 MCG/2ML IJ SOLN
INTRAMUSCULAR | Status: AC
  Filled 2024-06-11: qty 2

## 2024-06-11 MED ORDER — ONDANSETRON HCL 4 MG/2ML IJ SOLN
INTRAMUSCULAR | Status: DC | PRN
  Administered 2024-06-11: 4 mg via INTRAVENOUS

## 2024-06-11 MED ORDER — PROPOFOL 1000 MG/100ML IV EMUL
INTRAVENOUS | Status: AC
  Filled 2024-06-11: qty 300

## 2024-06-11 MED ORDER — PROPOFOL 10 MG/ML IV BOLUS
INTRAVENOUS | Status: DC | PRN
  Administered 2024-06-11: 100 mg via INTRAVENOUS

## 2024-06-11 MED ORDER — FENTANYL CITRATE (PF) 100 MCG/2ML IJ SOLN
25.0000 ug | INTRAMUSCULAR | Status: DC | PRN
  Administered 2024-06-11: 25 ug via INTRAVENOUS

## 2024-06-11 MED ORDER — EPHEDRINE 5 MG/ML INJ
INTRAVENOUS | Status: AC
  Filled 2024-06-11: qty 5

## 2024-06-11 MED ORDER — AMISULPRIDE (ANTIEMETIC) 5 MG/2ML IV SOLN
10.0000 mg | Freq: Once | INTRAVENOUS | Status: AC | PRN
  Administered 2024-06-11: 10 mg via INTRAVENOUS

## 2024-06-11 MED ORDER — DEXAMETHASONE SOD PHOSPHATE PF 10 MG/ML IJ SOLN
INTRAMUSCULAR | Status: DC | PRN
  Administered 2024-06-11: 5 mg via INTRAVENOUS

## 2024-06-11 MED ORDER — FENTANYL CITRATE (PF) 100 MCG/2ML IJ SOLN
INTRAMUSCULAR | Status: DC | PRN
  Administered 2024-06-11 (×2): 50 ug via INTRAVENOUS
  Administered 2024-06-11: 100 ug via INTRAVENOUS

## 2024-06-11 MED ORDER — LIDOCAINE-EPINEPHRINE (PF) 1 %-1:200000 IJ SOLN
INTRAMUSCULAR | Status: AC
  Filled 2024-06-11: qty 30

## 2024-06-11 MED ORDER — PHENYLEPHRINE 80 MCG/ML (10ML) SYRINGE FOR IV PUSH (FOR BLOOD PRESSURE SUPPORT)
PREFILLED_SYRINGE | INTRAVENOUS | Status: AC
  Filled 2024-06-11: qty 10

## 2024-06-11 MED ORDER — LIDOCAINE HCL 1 % IJ SOLN
INTRAMUSCULAR | Status: DC | PRN
  Administered 2024-06-11: 10 mL

## 2024-06-11 MED ORDER — OXYCODONE HCL 5 MG PO TABS
5.0000 mg | ORAL_TABLET | Freq: Once | ORAL | Status: AC | PRN
  Administered 2024-06-11: 5 mg via ORAL

## 2024-06-11 MED ORDER — ROPIVACAINE HCL 5 MG/ML IJ SOLN
INTRAMUSCULAR | Status: AC
  Filled 2024-06-11: qty 30

## 2024-06-11 MED ORDER — ACETAMINOPHEN 500 MG PO TABS
ORAL_TABLET | ORAL | Status: AC
  Filled 2024-06-11: qty 2

## 2024-06-11 MED ORDER — LIDOCAINE 2% (20 MG/ML) 5 ML SYRINGE
INTRAMUSCULAR | Status: AC
  Filled 2024-06-11: qty 5

## 2024-06-11 MED ORDER — LACTATED RINGERS IV SOLN: INTRAVENOUS | Status: DC

## 2024-06-11 MED ORDER — OXYCODONE HCL 5 MG PO TABS
ORAL_TABLET | ORAL | Status: AC
  Filled 2024-06-11: qty 1

## 2024-06-11 MED ORDER — PROPOFOL 500 MG/50ML IV EMUL
INTRAVENOUS | Status: DC | PRN
  Administered 2024-06-11: 250 ug/kg/min via INTRAVENOUS

## 2024-06-11 MED ORDER — DEXMEDETOMIDINE HCL IN NACL 80 MCG/20ML IV SOLN
INTRAVENOUS | Status: DC | PRN
Start: 2024-06-11 — End: 2024-06-11
  Administered 2024-06-11: 8 ug via INTRAVENOUS

## 2024-06-11 MED ORDER — LIDOCAINE HCL (CARDIAC) PF 100 MG/5ML IV SOSY
PREFILLED_SYRINGE | INTRAVENOUS | Status: DC | PRN
  Administered 2024-06-11: 60 mg via INTRAVENOUS

## 2024-06-11 MED ORDER — DEXMEDETOMIDINE HCL IN NACL 80 MCG/20ML IV SOLN
INTRAVENOUS | Status: AC
  Filled 2024-06-11: qty 20

## 2024-06-11 MED ORDER — CEFAZOLIN SODIUM-DEXTROSE 2-4 GM/100ML-% IV SOLN
2.0000 g | INTRAVENOUS | Status: AC
  Administered 2024-06-11: 2 g via INTRAVENOUS

## 2024-06-11 MED ORDER — ONDANSETRON HCL 4 MG/2ML IJ SOLN
INTRAMUSCULAR | Status: AC
  Filled 2024-06-11: qty 2

## 2024-06-11 MED ORDER — 0.9 % SODIUM CHLORIDE (POUR BTL) OPTIME
TOPICAL | Status: DC | PRN
  Administered 2024-06-11: 500 mL

## 2024-06-11 MED ORDER — ACETAMINOPHEN 500 MG PO TABS
1000.0000 mg | ORAL_TABLET | Freq: Once | ORAL | Status: AC
  Administered 2024-06-11: 1000 mg via ORAL

## 2024-06-11 MED ORDER — MIDAZOLAM HCL 2 MG/2ML IJ SOLN
INTRAMUSCULAR | Status: AC
  Filled 2024-06-11: qty 2

## 2024-06-11 SURGICAL SUPPLY — 45 items
BIT DRILL CANN 1.8 SK (BIT) IMPLANT
BLADE SURG 15 STRL LF DISP TIS (BLADE) ×2 IMPLANT
BNDG COHESIVE 4X5 TAN STRL LF (GAUZE/BANDAGES/DRESSINGS) ×1 IMPLANT
BNDG COMPR ESMARK 4X3 LF (GAUZE/BANDAGES/DRESSINGS) ×1 IMPLANT
BNDG ELASTIC 4INX 5YD STR LF (GAUZE/BANDAGES/DRESSINGS) ×2 IMPLANT
BNDG GAUZE DERMACEA FLUFF 4 (GAUZE/BANDAGES/DRESSINGS) ×1 IMPLANT
CHLORAPREP W/TINT 26 (MISCELLANEOUS) ×1 IMPLANT
CORD BIPOLAR FORCEPS 12FT (ELECTRODE) ×1 IMPLANT
COVER BACK TABLE 60X90IN (DRAPES) ×1 IMPLANT
CUFF TOURN SGL QUICK 18X4 (TOURNIQUET CUFF) IMPLANT
DRAPE HAND 77X146 (DRAPES) ×1 IMPLANT
DRAPE OEC MINIVIEW 54X84 (DRAPES) ×1 IMPLANT
DRAPE SURG 17X23 STRL (DRAPES) ×1 IMPLANT
DRIVER SURG 2 F/HIP SK (ORTHOPEDIC DISPOSABLE SUPPLIES) IMPLANT
GAUZE SPONGE 4X4 12PLY STRL (GAUZE/BANDAGES/DRESSINGS) ×1 IMPLANT
GAUZE XEROFORM 1X8 LF (GAUZE/BANDAGES/DRESSINGS) ×1 IMPLANT
GLOVE BIO SURGEON STRL SZ7.5 (GLOVE) ×2 IMPLANT
GLOVE BIOGEL PI IND STRL 7.5 (GLOVE) ×2 IMPLANT
GOWN STRL REUS W/TWL XL LVL3 (GOWN DISPOSABLE) ×1 IMPLANT
GOWN STRL SURGICAL XL XLNG (GOWN DISPOSABLE) ×1 IMPLANT
K-WIRE FIX DE .9X127 SK (WIRE) IMPLANT
KWIREFIX DE 1.1X127 (WIRE) IMPLANT
MANIFOLD NEPTUNE II (INSTRUMENTS) ×1 IMPLANT
NDL HYPO 25X1 1.5 SAFETY (NEEDLE) IMPLANT
NEEDLE HYPO 25X1 1.5 SAFETY (NEEDLE) ×1 IMPLANT
NS IRRIG 1000ML POUR BTL (IV SOLUTION) IMPLANT
PACK BASIN DAY SURGERY FS (CUSTOM PROCEDURE TRAY) ×1 IMPLANT
PAD CAST 4YDX4 CTTN HI CHSV (CAST SUPPLIES) ×2 IMPLANT
SCREW COMPR 2.0X20 ARTHRODESIS (Screw) IMPLANT
SHEET MEDIUM DRAPE 40X70 STRL (DRAPES) ×1 IMPLANT
SLEEVE SCD COMPRESS KNEE MED (STOCKING) ×1 IMPLANT
SPLINT PLASTER CAST XFAST 4X15 (CAST SUPPLIES) ×10 IMPLANT
STOCKINETTE IMPERVIOUS 9X36 MD (GAUZE/BANDAGES/DRESSINGS) ×1 IMPLANT
SUCTION TUBE FRAZIER 10FR DISP (SUCTIONS) IMPLANT
SUT ETHILON 4 0 PS 2 18 (SUTURE) ×1 IMPLANT
SUT MNCRL AB 3-0 PS2 27 (SUTURE) ×1 IMPLANT
SUT PDS AB 4-0 RB1 27 (SUTURE) IMPLANT
SUT SILK 4 0 PS 2 (SUTURE) IMPLANT
SUT VIC AB 3-0 FS2 27 (SUTURE) IMPLANT
SUT VIC AB 4-0 PS2 18 (SUTURE) IMPLANT
SYR BULB EAR ULCER 3OZ GRN STR (SYRINGE) ×2 IMPLANT
SYR CONTROL 10ML LL (SYRINGE) IMPLANT
TOWEL GREEN STERILE FF (TOWEL DISPOSABLE) ×2 IMPLANT
TUBE CONNECTING 20X1/4 (TUBING) IMPLANT
UNDERPAD 30X36 HEAVY ABSORB (UNDERPADS AND DIAPERS) ×1 IMPLANT

## 2024-06-11 NOTE — Anesthesia Postprocedure Evaluation (Signed)
 Anesthesia Post Note  Patient: Kashonda R Deuser  Procedure(s) Performed: DISTAL INTERPHALANGEAL JOINT FUSION (Right: Finger)     Patient location during evaluation: PACU Anesthesia Type: General Level of consciousness: awake Pain management: pain level controlled Vital Signs Assessment: post-procedure vital signs reviewed and stable Respiratory status: spontaneous breathing, nonlabored ventilation and respiratory function stable Cardiovascular status: blood pressure returned to baseline and stable Postop Assessment: no apparent nausea or vomiting Anesthetic complications: no   No notable events documented.  Last Vitals:  Vitals:   06/11/24 1200 06/11/24 1240  BP: (!) 154/86 132/78  Pulse: 73 76  Resp: 13 15  Temp:  (!) 36.4 C  SpO2: 94% 95%    Last Pain:  Vitals:   06/11/24 1240  PainSc: 4                  Robynn Marcel P Demetrie Borge

## 2024-06-11 NOTE — Transfer of Care (Signed)
 Immediate Anesthesia Transfer of Care Note  Patient: Tenesia R Aslinger  Procedure(s) Performed: DISTAL INTERPHALANGEAL JOINT FUSION (Right: Finger)  Patient Location: PACU  Anesthesia Type:General  Level of Consciousness: awake, alert , oriented, drowsy, and patient cooperative  Airway & Oxygen Therapy: Patient Spontanous Breathing and Patient connected to face mask oxygen  Post-op Assessment: Report given to RN and Post -op Vital signs reviewed and stable  Post vital signs: Reviewed and stable  Last Vitals:  Vitals Value Taken Time  BP    Temp    Pulse 86 06/11/24 11:24  Resp 17 06/11/24 11:24  SpO2 95 % 06/11/24 11:24  Vitals shown include unfiled device data.  Last Pain: There were no vitals filed for this visit.       Complications: No notable events documented.

## 2024-06-11 NOTE — Anesthesia Preprocedure Evaluation (Addendum)
 Anesthesia Evaluation  Patient identified by MRN, date of birth, ID band Patient awake    Reviewed: Allergy & Precautions, NPO status , Patient's Chart, lab work & pertinent test results  History of Anesthesia Complications (+) Family history of anesthesia reaction  Airway Mallampati: II  TM Distance: >3 FB Neck ROM: Full    Dental no notable dental hx.    Pulmonary asthma , sleep apnea , former smoker   Pulmonary exam normal        Cardiovascular hypertension, Normal cardiovascular exam     Neuro/Psych  PSYCHIATRIC DISORDERS Anxiety  Bipolar Disorder   negative neurological ROS     GI/Hepatic negative GI ROS, Neg liver ROS,,,  Endo/Other  negative endocrine ROS    Renal/GU negative Renal ROS     Musculoskeletal negative musculoskeletal ROS (+)    Abdominal  (+) + obese  Peds  Hematology negative hematology ROS (+)   Anesthesia Other Findings RIGHT SMALL FINGER DISTAL INTERPHALANGEAL ARTHRITIS  Reproductive/Obstetrics S/p BTL Hcg negative                              Anesthesia Physical Anesthesia Plan  ASA: 2  Anesthesia Plan: General   Post-op Pain Management:    Induction: Intravenous  PONV Risk Score and Plan: 3 and Ondansetron , Dexamethasone , Propofol infusion, Midazolam and Treatment may vary due to age or medical condition  Airway Management Planned: LMA  Additional Equipment:   Intra-op Plan:   Post-operative Plan: Extubation in OR  Informed Consent: I have reviewed the patients History and Physical, chart, labs and discussed the procedure including the risks, benefits and alternatives for the proposed anesthesia with the patient or authorized representative who has indicated his/her understanding and acceptance.     Dental advisory given  Plan Discussed with: CRNA  Anesthesia Plan Comments: (Potential family history of pseudocholinesterase deficiency )          Anesthesia Quick Evaluation

## 2024-06-11 NOTE — Op Note (Signed)
 NAME: Sandra Briggs MEDICAL RECORD NO: [id number] DATE OF BIRTH: 1976-04-27 FACILITY: [facility] LOCATION: [facility] PHYSICIAN: GILDARDO ALDERTON, MD   OPERATIVE REPORT   DATE OF PROCEDURE: 06/11/24    PREOPERATIVE DIAGNOSIS: Right small finger DIP arthritis   POSTOPERATIVE DIAGNOSIS: Right small finger DIP arthritis   PROCEDURE: Right small finger DIP fusion   SURGEON:  GILDARDO ALDERTON, M.D.   ASSISTANT: Joesph Hooks, OPA   ANESTHESIA:  General   INTRAVENOUS FLUIDS:  Per anesthesia flow sheet.   ESTIMATED BLOOD LOSS:  Minimal.   COMPLICATIONS:  None.   SPECIMENS:  none   TOURNIQUET TIME:    Total Tourniquet Time Documented: Upper Arm (Right) - 36 minutes Total: Upper Arm (Right) - 36 minutes    DISPOSITION:  Stable to PACU.   INDICATIONS: 48 year old female seen in the outpatient setting with significant right small finger DIP arthritis that was refractory to conservative care.  Patient was indicated for right small finger DIP arthrodesis.  Risks and benefits of surgery were discussed including the risks of infection, bleeding, scarring, stiffness, nerve injury, vascular injury, tendon injury, need for subsequent operation, persistent pain, hardware failure.  She voiced understanding of these risks and elected to proceed.  OPERATIVE COURSE: Patient was seen and identified in the preoperative area and marked appropriately.  Surgical consent had been signed. Preoperative IV antibiotic prophylaxis was given. She was transferred to the operating room and placed in supine position with the Right upper extremity on an arm board.  General anesthesia was induced by the anesthesiologist.  Right upper extremity was prepped and draped in normal sterile orthopedic fashion.  A surgical pause was performed between the surgeons, anesthesia, and operating room staff and all were in agreement as to the patient, procedure, and site of procedure.  Tourniquet was placed and padded  appropriately right upper arm.  10 cc of 1% lidocaine plain was utilized for digital block purposes of the right small finger.  The arm was exsanguinated and the tourniquet was inflated to 250 mmHg.  Transverse incision was designed over the DIP region, this was extended proximally and distally along the radial and ulnar aspects in order to expose the IP joint.  Joint surfaces were noted to be degenerative in nature at both the distal phalanx and middle phalanx region, rongeur was utilized to remove cartilaginous layer exposed underlying bone.  Joint surfaces were prepared appropriately for arthrodesis.   Guidewire was then inserted into the distal phalanx in antegrade fashion.  Biplanar fluoroscopy was utilized to confirm center center position on the distal phalanx.  This was then advanced through the distal phalanx and back through the middle phalanx, once again using biplanar fluoroscopy to confirm center center positioning within the bone.  The wire was then anchored in the middle phalanx region.  Measurement was taken.  We selected a 2.0 millimeter screw of 22 mm in length.  Once were satisfied with appropriate positioning of the guidewire, small incision was made over the guidewire at the distal tip followed by drill was then utilized for the 2.23mm [employer] arthodesis screw.  Drill and wire were then removed followed by insertion of the arthrodesis screw.  Excellent compression was noted with screw advancement across the DIP region, this was confirmed with biplanar fluoroscopy to confirm appropriate positioning.  Final biplanar fluoroscopic films were taken.  Tourniquet was subsequently deflated and bipolar electrocautery was utilized for hemostasis.  Tourniquet time was 36 minutes.  Copious irrigation was performed followed by closure  with skin incision using 4-0 nylon in simple standard fashion.  Sterile dressings were provided followed by application of a ulnar gutter splint using plaster.     Fingertips were pink with brisk capillary refill after deflation of tourniquet.  The operative drapes were broken down.  The patient was awoken from anesthesia safely and taken to PACU in stable condition.    Post-operative plan: The patient will recover in the post-anesthesia care unit and then be discharged home.  The patient will be non weight bearing on the left upper extremity in a dressing and ulnar gutter splint.   I will see the patient back in the office in 2 weeks for postoperative followup.  Discharge instruction were provided for appropriate dressing care maintenance and pain control.  Jerian Morais, MD Electronically signed, 06/11/24

## 2024-06-11 NOTE — Anesthesia Procedure Notes (Signed)
 Procedure Name: LMA Insertion Date/Time: 06/11/2024 10:07 AM  Performed by: Emilio Rock BIRCH, CRNAPre-anesthesia Checklist: Patient identified, Emergency Drugs available, Suction available and Patient being monitored Patient Re-evaluated:Patient Re-evaluated prior to induction Oxygen Delivery Method: Circle System Utilized Preoxygenation: Pre-oxygenation with 100% oxygen Induction Type: IV induction Ventilation: Mask ventilation without difficulty LMA: LMA inserted LMA Size: 4.0 Number of attempts: 1 Airway Equipment and Method: bite block Placement Confirmation: positive ETCO2 Tube secured with: Tape Dental Injury: Teeth and Oropharynx as per pre-operative assessment

## 2024-06-11 NOTE — Discharge Instructions (Addendum)
    Hand Surgery Postop Instructions   Dressings: Maintain postoperative dressing until orthopedic follow-up.  Keep operative site clean and dry until orthopedic follow-up.  Wound Care: Keep your hand elevated above the level of your heart.  Do not allow it to dangle by your side. Moving your fingers is advised to stimulate circulation but will depend on the site of your surgery.  If you have a splint applied, your doctor will advise you regarding movement.  Activity: Do not drive or operate machinery until clearance given from physician. No heavy lifting with operative extremity.  Diet:  Drink liquids today or eat a light diet.  You may resume a regular diet tomorrow.    General expectations: Take prescribed medication if given, transition to over-the-counter medication as quickly as possible. Fingers may become slightly swollen.  Call your doctor if any of the following occur: Severe pain not relieved by pain medication. Elevated temperature. Dressing soaked with blood. Inability to move fingers. White or bluish color to fingers.   Per [facility] clinic policy, our goal is ensure optimal postoperative pain control with a multimodal pain management strategy. For all [facility] patients, our goal is to wean post-operative narcotic medications by 6 weeks post-operatively. If this is not possible due to utilization of pain medication prior to surgery, your [facility] doctor will support your acute post-operative pain control for the first 6 weeks postoperatively, with a plan to transition you back to your primary pain team following that. Maralee will work to ensure a Therapist, occupational.  Anshul Afton Alderton, M.D. Hand Surgery [facility]   No Tylenol  before 4:00pm   Post Anesthesia Home Care Instructions  Activity: Get plenty of rest for the remainder of the day. A responsible individual must stay with you for 24 hours following the procedure.  For the next 24  hours, DO NOT: -Drive a car -Advertising copywriter -Drink alcoholic beverages -Take any medication unless instructed by your physician -Make any legal decisions or sign important papers.  Meals: Start with liquid foods such as gelatin or soup. Progress to regular foods as tolerated. Avoid greasy, spicy, heavy foods. If nausea and/or vomiting occur, drink only clear liquids until the nausea and/or vomiting subsides. Call your physician if vomiting continues.  Special Instructions/Symptoms: Your throat may feel dry or sore from the anesthesia or the breathing tube placed in your throat during surgery. If this causes discomfort, gargle with warm salt water. The discomfort should disappear within 24 hours.

## 2024-06-11 NOTE — Interval H&P Note (Signed)
 History and Physical Interval Note:  06/11/2024 9:56 AM  Sandra Briggs  has presented today for surgery, with the diagnosis of RIGHT SMALL FINGER DISTAL INTERPHALANGEAL ARTHRITIS.  The various methods of treatment have been discussed with the patient and family. After consideration of risks, benefits and other options for treatment, the patient has consented to  Procedure(s) with comments: DISTAL INTERPHALANGEAL JOINT FUSION (Right) - RIGHT SMALL FINGER DISTAL INTERPHALANGEAL JOINT FUSION as a surgical intervention.  The patient's history has been reviewed, patient examined, no change in status, stable for surgery.  I have reviewed the patient's chart and labs.  Questions were answered to the patient's satisfaction.     Ysmael Hires

## 2024-06-14 ENCOUNTER — Encounter (HOSPITAL_BASED_OUTPATIENT_CLINIC_OR_DEPARTMENT_OTHER): Payer: Self-pay | Admitting: Orthopedic Surgery

## 2024-06-21 ENCOUNTER — Encounter: Payer: Self-pay | Admitting: Radiology

## 2024-06-22 NOTE — Therapy (Signed)
 OUTPATIENT OCCUPATIONAL THERAPY ORTHO EVALUATION  Patient Name: Sandra Briggs MRN: [id number] DOB:1975-12-18, 48 y.o., female Today's Date: 06/24/2024  PCP: Dr. Donah REFERRING PROVIDER: Dr. Arlinda   END OF SESSION:  OT End of Session - 06/24/24 0831     Visit Number 1    Number of Visits 1    Authorization Type BCBS    OT Start Time 0831    OT Stop Time 0857    OT Time Calculation (min) 26 min    Equipment Utilized During Treatment orthotic materials    Activity Tolerance Patient limited by pain;Patient tolerated treatment well;No increased pain    Behavior During Therapy WFL for tasks assessed/performed          Past Medical History:  Diagnosis Date   Anxiety    Chronic pain    flank   DDD (degenerative disc disease), lumbar    Dysuria-frequency syndrome    Exercise-induced asthma    Family history of adverse reaction to anesthesia    Grandfather w/ hx of allergy to propofol, history of family allergy to Succinylcholine, family members have been tested with muscle biopsy, maternal aunt tested positive   Hypertension    Panic disorder    Pseudogout    Sleep apnea    Sprain of MCL (medial collateral ligament) of knee    Past Surgical History:  Procedure Laterality Date   APPENDECTOMY     CESAREAN SECTION     CRYOABLATION N/A    DISTAL INTERPHALANGEAL JOINT FUSION Right 06/11/2024   Procedure: DISTAL INTERPHALANGEAL JOINT FUSION;  Surgeon: Sandra Buster, MD;  Location: [facility];  Service: Orthopedics;  Laterality: Right;  RIGHT SMALL FINGER DISTAL INTERPHALANGEAL JOINT FUSION   ENDOMETRIAL ABLATION  2009   novasure   TUBAL LIGATION  06/28/2005   WISDOM TOOTH EXTRACTION  2000   Patient Active Problem List   Diagnosis Date Noted   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 06/11/2024   Routine adult health maintenance 07/23/2023   OSA (obstructive sleep apnea) 07/23/2023   Numbness and tingling  in left hand 04/07/2023   Other fatigue 08/29/2022   Hypertension 08/03/2022   Syncope 07/10/2022   Dyspareunia in female 08/06/2021   Rash and nonspecific skin eruption 11/19/2017   Postcoital bleeding 08/18/2017   Hand pain 08/18/2017   Encounter for chronic pain management 09/04/2014   Shortness of breath 03/04/2014   Pseudogout 12/30/2013   Overweight (BMI 25.0-29.9) 02/27/2012   Cervicalgia 09/07/2010   Bipolar disorder (HCC) 07/02/2007   PANIC DISORDER 06/08/2007   Allergic rhinitis 10/16/2006    ONSET DATE: DOS 06/11/24  REFERRING DIAG: M15.1 (ICD-10-CM) - 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  She states she is feeling a little soreness now in the small finger after having her stitches taken out.[name]     PERTINENT HISTORY: History of arthritis in bilateral hands thought to be autoimmune in nature  PRECAUTIONS: None  RED FLAGS: None   WEIGHT BEARING RESTRICTIONS: Yes nonweightbearing through the small finger DIP joint now and recommended to keep weight less than 5 pounds in the right hand for 4 weeks  PAIN:  Are you having pain? Yes: NPRS  scale: 1-2/10 Pain location: Right hand small finger Pain description: Sore Aggravating factors: N/A Relieving factors: Rest  FALLS: Has patient fallen in last 6 months? No  PLOF: Independent  PATIENT GOALS: To safely recover from DIP joint fusion surgery  NEXT MD VISIT: Approximately 4 weeks   OBJECTIVE: (All objective assessments below are from initial evaluation on: 06/24/24 unless otherwise specified.)    HAND DOMINANCE: Left handed  ADLs: Overall ADLs: States decreased ability to grab, hold household objects,  pain and difficulty to open containers, perform FMS tasks (manipulate fasteners on clothing).    UPPER EXTREMITY ROM     Shoulder to Wrist AROM Right eval  Wrist flexion WFL  Wrist extension WFL  (Blank rows = not tested)   Hand AROM Right eval  Full Fist Ability (or Gap to Distal Palmar Crease) Unable with a stiff small finger now but other fingers are within functional limits  Thumb Opposition  (Kapandji Scale)  Within functional limits  Thumb MCP (0-60)   Thumb IP (0-80)   Thumb Radial Abduction Span   Thumb Palmar Abduction Span   Index MCP (0-90)   Index PIP (0-100)   Index DIP (0-70)    Long MCP (0-90)    Long PIP (0-100)    Long DIP (0-70)    Ring MCP (0-90)    Ring PIP (0-100)    Ring DIP (0-70)    Little MCP (0-90) ~80*   Little PIP (0-100)  ~45*  Little DIP (0-70)  X fused X   (Blank rows = not tested)   UPPER EXTREMITY MMT:    Eval:  NT at eval due to recent and still healing injuries. Expected to improve with HEP and recommendations.   HAND FUNCTION: Eval: Observed weakness in affected right hand, grossly 3-/5 MMT, but specific gripping and resistance training contraindicated today. Expected to improve with HEP and recommendations.    COORDINATION: Eval: Mild observed coordination impairments with affected right hand. Expected to improve with HEP and recommendations.   SENSATION: Eval:  Light touch intact today, though diminished around sx area. Expected to improve with HEP and recommendations.   EDEMA:   Eval:  Mildly swollen in right small finger today.  Expected to improve with HEP and recommendations.   COGNITION: Eval: Overall cognitive status: WFL for evaluation today   OBSERVATIONS:   Eval: Surgical site is clean and no overt signs of infection, no drainage, signs of dehiscence, etc.  Tenderness and swelling is within normal limits for post-op timeframe.     TODAY'S TREATMENT:  Post-evaluation treatment:   Custom orthotic fabrication was  indicated due to pt's healing small finger fusion and need for safe, functional positioning. OT fabricated custom DIP joint blocking orthosis for pt today to immobilize the right small finger DIP joint. It fit well with no areas of pressure, pt states a comfortable fit. Pt was educated on the wearing schedule (on at all times except for hygiene for 4 weeks), to avoid exposing it to sources of heat, to wipe clean as needed (do not wash, use harsh detergents), to call or come in ASAP if it is causing any irritation or is not achieving desired function. It will be checked/adjusted in upcoming sessions, as needed. Pt states understanding all directions.     The patient was given safety information for managing post-op wound, including not to soak wound, to keep clean and dry, to start with scar mobilizations by applying a Vaseline to the wound 2-3 times a day.  They should replace the dressing daily, and monitor for signs of infection. They were also supplied with compressive gauze to help with swelling as needed.  They should contact the surgeon with any concerns immediately.   The patient should also avoid any strong gripping, push, pull, weight bearing or repetitive motion for the next month.  They should not be doing painful activities.   After a month, they can progressively return to all light, normal activities. Sports and heavy weight lifting  should be withheld for a total of 3 months.   The patient was also educated (explanation and demonstration) on the following home exercise program including tolerable range of motion, gentle passive range of motion, scar care, progressive desensitization, prevention of soft tissue contractures, etc. The patient states understanding all directions and feels comfortable with doing this at home, self-management, and following up with the surgeon as needed/scheduled.     Finger fusion guidelines:   Wear brace through 6 weeks to protect your fusion. Ok to take off  to bathe, but try not to use your small finger much or cause pain.   Ok to start moving all joints except the tip of the small finger!   Never stretch on your fusion.   You can start grip training lightly at 6 weeks post-op. (4 more weeks)  You should be practicing fine motor skills (spin pen, pick up things) to get used to your motor skills now. try to include small finger in the brace.  2-3 weeks, ok to take off brace to do light fine motor skills, if not painful!    Exercises - Tendon Glides  - 4-6 x daily - 3-5 reps - 2-3 seconds hold  PATIENT EDUCATION: Education details: See tx section above for details  Person educated: Patient Education method: Verbal Instruction, Teach back, Handouts  Education comprehension: States and demonstrates understanding   HOME EXERCISE PROGRAM: Access Code: [email or web address] URL: [email or web address] Date: 06/24/2024 Prepared by: Melvenia Ada   GOALS: Goals reviewed with patient? Yes   SHORT TERM GOALS: (STG required if POC>30 days) Target Date: 06/24/24  1.  Pt will demo/state understanding of initial HEP and therapist recommendations to improve pain levels, improve motions and ability and eventually return to normal activities.   Goal status: MET  2. Pt will obtain protective, custom orthotic to prevent injury to surgical site and prevent soft-tissue contractures.  Goal status: MET     ASSESSMENT:  CLINICAL IMPRESSION: Patient is a 48 y.o. female who was seen today for occupational therapy evaluation for swelling, pain, weakness and decreased functional ability following Rt SF DIP J fusion procedure. The patient is appropriate for OT rehab services and benefited from treatment today. The patient got copious education/treatment today for self-care, wound management, exercises and how to transition to normal activities in the next 4-6 weeks. The patient agrees that they can manage these recommendations independently, and  should not need to return for follow up visits. The patient should follow up with the surgeon with any concerns, and could possibly return to therapy, if needed, with a new order.  The patient will discharge therapy treatment after this visit.     PERFORMANCE DEFICITS: in functional skills including ADLs, IADLs, coordination, dexterity, sensation, edema, ROM, strength, pain, fascial restrictions, flexibility, Fine motor control, body mechanics, endurance, decreased knowledge of precautions, wound, and UE functional use, cognitive skills including problem solving and safety awareness, and psychosocial skills including coping strategies, environmental adaptation, and habits.   IMPAIRMENTS: are limiting patient from  ADLs, IADLs, rest and sleep, leisure, and social participation.   COMORBIDITIES: may have co-morbidities  that affects occupational performance. Patient will benefit from skilled OT to address above impairments and improve overall function.  MODIFICATION OR ASSISTANCE TO COMPLETE EVALUATION: No modification of tasks or assist necessary to complete an evaluation.  OT OCCUPATIONAL PROFILE AND HISTORY: Problem focused assessment: Including review of records relating to presenting problem.  CLINICAL DECISION MAKING: LOW - limited treatment options, no task modification necessary  REHAB POTENTIAL: Excellent  EVALUATION COMPLEXITY: Low      PLAN:  OT FREQUENCY: one time visit  OT DURATION: 1 sessions  PLANNED INTERVENTIONS: self care/ADL training, therapeutic exercise, therapeutic activity, neuromuscular re-education, manual therapy, scar mobilization, passive range of motion, splinting, ultrasound, fluidotherapy, compression bandaging, moist heat, cryotherapy, contrast bath, patient/family education, energy conservation, coping strategies training, and Re-evaluation  RECOMMENDED OTHER SERVICES: none now   CONSULTED AND AGREED WITH PLAN OF CARE: Patient  PLAN FOR NEXT SESSION:    N/A    Melvenia Ada, OTR/L, CHT 06/24/2024, 9:04 AM

## 2024-06-24 ENCOUNTER — Encounter: Payer: Self-pay | Admitting: Rehabilitative and Restorative Service Providers"

## 2024-06-24 ENCOUNTER — Ambulatory Visit (INDEPENDENT_AMBULATORY_CARE_PROVIDER_SITE_OTHER): Admitting: Rehabilitative and Restorative Service Providers"

## 2024-06-24 ENCOUNTER — Ambulatory Visit (INDEPENDENT_AMBULATORY_CARE_PROVIDER_SITE_OTHER): Payer: Self-pay

## 2024-06-24 ENCOUNTER — Ambulatory Visit (INDEPENDENT_AMBULATORY_CARE_PROVIDER_SITE_OTHER): Admitting: Orthopedic Surgery

## 2024-06-24 DIAGNOSIS — R278 Other lack of coordination: Secondary | ICD-10-CM

## 2024-06-24 DIAGNOSIS — M151 Heberden's nodes (with arthropathy): Secondary | ICD-10-CM

## 2024-06-24 DIAGNOSIS — R600 Localized edema: Secondary | ICD-10-CM | POA: Diagnosis not present

## 2024-06-24 DIAGNOSIS — M79641 Pain in right hand: Secondary | ICD-10-CM | POA: Diagnosis not present

## 2024-06-24 DIAGNOSIS — M6281 Muscle weakness (generalized): Secondary | ICD-10-CM

## 2024-06-24 NOTE — Progress Notes (Signed)
   Sandra Briggs - 48 y.o. female MRN [id number]  Date of birth: 27-Feb-1976  Office Visit Note: Visit Date: 06/24/2024 PCP: Donah Laymon PARAS, MD Referred by: Donah Laymon PARAS, MD  Subjective:  HPI: Sandra Briggs is a 48 y.o. female who presents today for follow up 2 weeks status post right small finger DIP fusion.  Doing well overall, pain is controlled.  Pertinent ROS were reviewed with the patient and found to be negative unless otherwise specified above in HPI.   Assessment & Plan: Visit Diagnoses:  1. 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  X-rays demonstrate stable appearance of the small finger DIP fusion hardware.  She will be seen by occupational therapy today for fabrication of her orthosis and to begin gentle range of motion exercises of the MP and PIP joint.  Follow-up with myself in 2 to 3 weeks for clinical and radiographic recheck.  Follow-up: No follow-ups on file.   Meds & Orders: No orders of the defined types were placed in this encounter.   Orders Placed This Encounter  Procedures   XR Finger Little Right     Procedures: No procedures performed       Objective:   Vital Signs: There were no vitals taken for this visit.  Ortho Exam Right small finger well-healing dorsal incision, no erythema or drainage  Imaging: XR Finger Little Right Result Date: 06/24/2024 X-rays demonstrate stable appearance of the small finger DIP fusion, screw remains well-fixed in all planes    Marlos Carmen Estela) Joshus Rogan, M.D. [facility], Hand Surgery

## 2024-07-07 ENCOUNTER — Encounter: Payer: Self-pay | Admitting: Orthopedic Surgery

## 2024-07-07 ENCOUNTER — Encounter: Payer: Self-pay | Admitting: Family Medicine

## 2024-07-07 DIAGNOSIS — G8929 Other chronic pain: Secondary | ICD-10-CM

## 2024-07-08 MED ORDER — HYDROCODONE-ACETAMINOPHEN 5-325 MG PO TABS: 2.0000 | ORAL_TABLET | Freq: Two times a day (BID) | ORAL | 0 refills | Status: DC | PRN

## 2024-07-13 NOTE — Progress Notes (Unsigned)
   [name] - 48 y.o. female MRN [id number]  Date of birth: 1976-02-17  Office Visit Note: Visit Date: 07/14/2024 PCP: Donah Laymon PARAS, MD Referred by: Donah Laymon PARAS, MD  Subjective:  HPI: Sandra Briggs is a 48 y.o. female who presents today for follow up 4 weeks status post right small finger DIP fusion.  Pertinent ROS were reviewed with the patient and found to be negative unless otherwise specified above in HPI.   Assessment & Plan: Visit Diagnoses: No diagnosis found.  Plan: ***  Follow-up: No follow-ups on file.   Meds & Orders: No orders of the defined types were placed in this encounter.  No orders of the defined types were placed in this encounter.    Procedures: No procedures performed       Objective:   Vital Signs: There were no vitals taken for this visit.  Ortho Exam ***  Imaging: No results found.   Izack Hoogland Afton Alderton, M.D. [facility], Hand Surgery

## 2024-07-14 ENCOUNTER — Other Ambulatory Visit (INDEPENDENT_AMBULATORY_CARE_PROVIDER_SITE_OTHER): Payer: Self-pay

## 2024-07-14 ENCOUNTER — Ambulatory Visit (INDEPENDENT_AMBULATORY_CARE_PROVIDER_SITE_OTHER): Admitting: Orthopedic Surgery

## 2024-07-14 DIAGNOSIS — M151 Heberden's nodes (with arthropathy): Secondary | ICD-10-CM | POA: Diagnosis not present

## 2024-07-19 ENCOUNTER — Ambulatory Visit: Admitting: Family Medicine

## 2024-08-03 ENCOUNTER — Encounter: Payer: Self-pay | Admitting: Family Medicine

## 2024-08-03 DIAGNOSIS — G8929 Other chronic pain: Secondary | ICD-10-CM

## 2024-08-03 MED ORDER — HYDROCODONE-ACETAMINOPHEN 5-325 MG PO TABS: 2.0000 | ORAL_TABLET | Freq: Two times a day (BID) | ORAL | 0 refills | Status: DC | PRN

## 2024-08-03 MED ORDER — HYDROCHLOROTHIAZIDE 12.5 MG PO CAPS: 12.5000 mg | ORAL_CAPSULE | Freq: Every day | ORAL | 1 refills | Status: DC

## 2024-08-10 NOTE — Progress Notes (Signed)
" ° °  Sandra Briggs - 48 y.o. female MRN [id number]  Date of birth: Mar 17, 1976  Office Visit Note: Visit Date: 08/11/2024 PCP: Donah Laymon PARAS, MD Referred by: Donah Laymon PARAS, MD  Subjective:  HPI: Sandra Briggs is a 48 y.o. female who presents today for follow up 9 weeks status post right small finger DIP fusion.  She is doing very well, pain is well-controlled  Pertinent ROS were reviewed with the patient and found to be negative unless otherwise specified above in HPI.   Assessment & Plan: Visit Diagnoses:  1. 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  X-rays today show stable appearance of the DIP fusion screw.  She can begin to progress activities as tolerated.  Does have some ongoing hypersensitivity which is improving.  Follow-up in 1 month  Follow-up: No follow-ups on file.   Meds & Orders: No orders of the defined types were placed in this encounter.   Orders Placed This Encounter  Procedures   XR Finger Little Right     Procedures: No procedures performed       Objective:   Vital Signs: There were no vitals taken for this visit.  Ortho Exam Right small finger with well-healed incisional sites, PIP range of motion 0-60, slightly improved passively without significant pain, notable hypersensitivity at the distal aspect of the finger, DIP remains stable  Imaging: XR Finger Little Right Result Date: 08/11/2024 X-rays of the right small finger demonstrate stable appearance of the arthrodesis screw across the DIP joint, well secured without evidence of failure or migration    Anessia Oakland Estela) Arlinda, M.D. [facility], Hand Surgery  "

## 2024-08-11 ENCOUNTER — Ambulatory Visit: Admitting: Orthopedic Surgery

## 2024-08-11 ENCOUNTER — Other Ambulatory Visit: Payer: Self-pay

## 2024-08-11 DIAGNOSIS — M151 Heberden's nodes (with arthropathy): Secondary | ICD-10-CM

## 2024-08-23 ENCOUNTER — Encounter: Payer: Self-pay | Admitting: Family Medicine

## 2024-08-23 ENCOUNTER — Ambulatory Visit (INDEPENDENT_AMBULATORY_CARE_PROVIDER_SITE_OTHER): Admitting: Family Medicine

## 2024-08-23 VITALS — BP 141/81 | HR 85 | Ht 65.0 in | Wt 191.2 lb

## 2024-08-23 DIAGNOSIS — L720 Epidermal cyst: Secondary | ICD-10-CM | POA: Diagnosis not present

## 2024-08-23 DIAGNOSIS — G8929 Other chronic pain: Secondary | ICD-10-CM | POA: Diagnosis not present

## 2024-08-23 DIAGNOSIS — Z1231 Encounter for screening mammogram for malignant neoplasm of breast: Secondary | ICD-10-CM | POA: Diagnosis not present

## 2024-08-23 DIAGNOSIS — M79641 Pain in right hand: Secondary | ICD-10-CM

## 2024-08-23 DIAGNOSIS — M542 Cervicalgia: Secondary | ICD-10-CM

## 2024-08-23 DIAGNOSIS — Z0000 Encounter for general adult medical examination without abnormal findings: Secondary | ICD-10-CM

## 2024-08-23 DIAGNOSIS — M79642 Pain in left hand: Secondary | ICD-10-CM | POA: Diagnosis not present

## 2024-08-23 DIAGNOSIS — I10 Essential (primary) hypertension: Secondary | ICD-10-CM | POA: Diagnosis not present

## 2024-08-23 MED ORDER — HYDROCODONE-ACETAMINOPHEN 5-325 MG PO TABS: 2.0000 | ORAL_TABLET | Freq: Two times a day (BID) | ORAL | 0 refills | Status: AC | PRN

## 2024-08-23 MED ORDER — HYDROCHLOROTHIAZIDE 25 MG PO TABS: 25.0000 mg | ORAL_TABLET | Freq: Every day | ORAL | 3 refills | Status: AC

## 2024-08-23 MED ORDER — GABAPENTIN 300 MG PO CAPS: 300.0000 mg | ORAL_CAPSULE | Freq: Three times a day (TID) | ORAL | 3 refills | Status: AC

## 2024-08-23 NOTE — Patient Instructions (Addendum)
 It was great to see you again today.  Refilled gabapentin  and norco Call rheumatology office to schedule Go up on hydrochlorothiazide  to 25mg  daily Follow up in 2 weeks for RN BP check and labs to ensure kidneys are tolerating medication   To schedule your mammogram, call the [facility] at [phone]. They are located at [address].      Be well, Dr. Donah

## 2024-08-23 NOTE — Progress Notes (Unsigned)
 "  Date of Visit: 08/23/2024   SUBJECTIVE:   HPI:  Discussed the use of AI scribe software for clinical note transcription with the patient, who gave verbal consent to proceed.  History of Present Illness Sandra Briggs is a 49 year old female who presents for medication refills and evaluation of finger joint issues.  Hand joint degeneration and pain - Significant degeneration in hand joints, particularly distal interphalangeal (DIP) joints, over the past year - Suspected autoimmune etiology per surgeon - History of surgery on pinky finger with joint removal and fusion, resulting in limited mobility and persistent pain with impact - X-rays described as showing 'weird' degeneration at rheumatology evaluation - Significant joint pain, especially in hands  Neck mass and cutaneous lesions - Small, persistent knot on neck, described as a cyst - History of boils, associated with current cyst  Back pain - Severe back pain - MRI demonstrated degenerative changes and S1 involvement - Suspects possible association with Lipitor use  Constipation - Constipation as a side effect of Norco - Manages constipation  Allergic symptoms - Currently taking Zyrtec  10 mg twice daily - Stopped Singulair  as Zyrtec  is adequately controlling symptoms  Hypertension - Home blood pressure readings generally under 140/90, but not consistently - Concerned about elevated blood pressure and monitoring regularly - Currently taking HCTZ 12.5 mg daily  Hyperlipidemia - History of high cholesterol - Currently taking Lipitor  Carpal tunnel syndrome - History of carpal tunnel syndrome, but determined not to have current carpal tunnel syndrome  Breast health surveillance - History of dense breast tissue - Mammogram in July 2022 with recommendation for 44-month follow-up, which was missed      OBJECTIVE:   BP (!) 144/84   Pulse 85   Ht 5' 5 (1.651 m)   Wt 191 lb 3.2 oz (86.7 kg)   SpO2 97%   BMI  31.82 kg/m  Gen: *** HEENT: *** Heart: *** Lungs: *** Neuro: *** Ext: ***  ASSESSMENT/PLAN:   Assessment & Plan Immunity status testing  Hypertension, unspecified type  Encounter for screening mammogram for malignant neoplasm of breast  Chronic neck pain      Assessment and Plan Assessment & Plan 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 Previous joint fusion on pinky finger. Persistent pain and limited motion. Possible autoimmune etiology suggested by rheumatologist. Pain improved with reduced activity. - Schedule follow-up with rheumatologist for further evaluation and management. - Continue current pain management regimen with Norco and gabapentin .  Hypertension Recent improvement in home blood pressure readings, but office readings remain elevated. Current regimen includes hydrochlorothiazide . - Increased hydrochlorothiazide  dose to 25 mg daily. - Scheduled nurse blood pressure check appointment in two weeks. - Scheduled lab visit to monitor kidney function and check hepatitis B immunity.  Hyperlipidemia with statin-induced myalgia Managed with Lipitor, associated with muscle spasms and back pain. Pain improved after reducing Lipitor. - Discontinued Lipitor temporarily to assess impact on muscle pain. - Will consider alternative lipid-lowering therapy if symptoms persist.  Epidermal cyst of neck Small cyst on neck, no infection or significant discomfort. - Continue to monitor cyst for changes in size or signs of infection.  General health maintenance Due for routine mammogram. Not immune to hepatitis B, eligible for vaccination. - Ordered mammogram. - Will administer hepatitis B vaccine series if not previously completed.      FOLLOW UP: Follow up in *** for ***  Sanii Kukla J. Donah, MD [facility] "

## 2024-08-25 NOTE — Assessment & Plan Note (Signed)
 Stable, well controlled with current regimen PDMP is appropriate - Continue current pain management regimen with Norco and gabapentin . - refilled x3 months, follow up in 3 months

## 2024-08-25 NOTE — Assessment & Plan Note (Signed)
 Recent improvement in home blood pressure readings, but office readings remain elevated. Current regimen includes hydrochlorothiazide . - Increased hydrochlorothiazide  dose to 25 mg daily. - Scheduled nurse blood pressure check appointment in two weeks, and lab visit for BMET at same time

## 2024-08-25 NOTE — Assessment & Plan Note (Signed)
 Significant degeneration in hand joints, especially DIP joints. Previous joint fusion on pinky finger. Persistent pain and limited motion. Possible autoimmune etiology suggested by orthopedic surgeon based on clinical findings and xray. Pain improved with reduced activity. - Schedule follow-up with rheumatologist for further evaluation and management.

## 2024-08-25 NOTE — Assessment & Plan Note (Signed)
 Due for routine mammogram. Not immune to hepatitis B, eligible for vaccination. - Ordered mammogram. - can get hep B vaccine series beginning at upcoming nurse visit

## 2024-09-13 ENCOUNTER — Encounter: Admitting: Orthopedic Surgery

## 2024-09-14 ENCOUNTER — Encounter: Admitting: Orthopedic Surgery
